# Patient Record
Sex: Female | Born: 1960
Health system: Southern US, Community
[De-identification: ages and names within clinical notes are randomized; demographics above are authoritative.]

## PROBLEM LIST (undated history)

## (undated) DIAGNOSIS — K589 Irritable bowel syndrome without diarrhea: Secondary | ICD-10-CM

## (undated) DIAGNOSIS — T8859XA Other complications of anesthesia, initial encounter: Secondary | ICD-10-CM

## (undated) DIAGNOSIS — L309 Dermatitis, unspecified: Secondary | ICD-10-CM

## (undated) DIAGNOSIS — IMO0002 Reserved for concepts with insufficient information to code with codable children: Secondary | ICD-10-CM

## (undated) DIAGNOSIS — E781 Pure hyperglyceridemia: Secondary | ICD-10-CM

## (undated) DIAGNOSIS — F329 Major depressive disorder, single episode, unspecified: Secondary | ICD-10-CM

## (undated) DIAGNOSIS — D649 Anemia, unspecified: Secondary | ICD-10-CM

## (undated) DIAGNOSIS — K2270 Barrett's esophagus without dysplasia: Secondary | ICD-10-CM

## (undated) DIAGNOSIS — E785 Hyperlipidemia, unspecified: Secondary | ICD-10-CM

## (undated) DIAGNOSIS — G35 Multiple sclerosis: Secondary | ICD-10-CM

## (undated) DIAGNOSIS — D509 Iron deficiency anemia, unspecified: Secondary | ICD-10-CM

## (undated) DIAGNOSIS — H269 Unspecified cataract: Secondary | ICD-10-CM

## (undated) DIAGNOSIS — K219 Gastro-esophageal reflux disease without esophagitis: Secondary | ICD-10-CM

## (undated) DIAGNOSIS — E669 Obesity, unspecified: Secondary | ICD-10-CM

## (undated) DIAGNOSIS — I10 Essential (primary) hypertension: Secondary | ICD-10-CM

## (undated) DIAGNOSIS — F32A Depression, unspecified: Secondary | ICD-10-CM

## (undated) DIAGNOSIS — F419 Anxiety disorder, unspecified: Secondary | ICD-10-CM

## (undated) DIAGNOSIS — M1990 Unspecified osteoarthritis, unspecified site: Secondary | ICD-10-CM

## (undated) DIAGNOSIS — W540XXA Bitten by dog, initial encounter: Secondary | ICD-10-CM

## (undated) DIAGNOSIS — T783XXA Angioneurotic edema, initial encounter: Secondary | ICD-10-CM

## (undated) HISTORY — PX: WISDOM TOOTH EXTRACTION: SHX21

## (undated) HISTORY — DX: Bitten by dog, initial encounter: W54.0XXA

## (undated) HISTORY — DX: Obesity, unspecified: E66.9

## (undated) HISTORY — DX: Dermatitis, unspecified: L30.9

## (undated) HISTORY — DX: Angioneurotic edema, initial encounter: T78.3XXA

## (undated) HISTORY — DX: Unspecified osteoarthritis, unspecified site: M19.90

## (undated) HISTORY — DX: Irritable bowel syndrome, unspecified: K58.9

## (undated) HISTORY — DX: Essential (primary) hypertension: I10

## (undated) HISTORY — DX: Gastro-esophageal reflux disease without esophagitis: K21.9

## (undated) HISTORY — DX: Depression, unspecified: F32.A

## (undated) HISTORY — DX: Barrett's esophagus without dysplasia: K22.70

## (undated) HISTORY — DX: Multiple sclerosis: G35

## (undated) HISTORY — DX: Major depressive disorder, single episode, unspecified: F32.9

## (undated) HISTORY — DX: Hyperlipidemia, unspecified: E78.5

## (undated) HISTORY — PX: OTHER SURGICAL HISTORY: SHX169

## (undated) HISTORY — DX: Pure hyperglyceridemia: E78.1

---

## 1988-05-11 DIAGNOSIS — G35 Multiple sclerosis: Secondary | ICD-10-CM

## 1988-05-11 HISTORY — DX: Multiple sclerosis: G35

## 2005-02-23 ENCOUNTER — Ambulatory Visit: Payer: Self-pay | Admitting: Cardiology

## 2006-02-22 ENCOUNTER — Ambulatory Visit: Payer: Self-pay | Admitting: Cardiology

## 2008-05-11 HISTORY — PX: CHOLECYSTECTOMY: SHX55

## 2009-01-08 ENCOUNTER — Encounter: Payer: Self-pay | Admitting: Family Medicine

## 2009-03-14 ENCOUNTER — Encounter: Payer: Self-pay | Admitting: Family Medicine

## 2009-05-23 ENCOUNTER — Ambulatory Visit: Payer: Self-pay | Admitting: Family Medicine

## 2009-05-23 DIAGNOSIS — E785 Hyperlipidemia, unspecified: Secondary | ICD-10-CM | POA: Insufficient documentation

## 2009-05-23 DIAGNOSIS — G35 Multiple sclerosis: Secondary | ICD-10-CM | POA: Insufficient documentation

## 2009-05-23 DIAGNOSIS — L259 Unspecified contact dermatitis, unspecified cause: Secondary | ICD-10-CM | POA: Insufficient documentation

## 2009-05-23 DIAGNOSIS — J438 Other emphysema: Secondary | ICD-10-CM | POA: Insufficient documentation

## 2009-05-23 DIAGNOSIS — Z8739 Personal history of other diseases of the musculoskeletal system and connective tissue: Secondary | ICD-10-CM | POA: Insufficient documentation

## 2009-05-23 DIAGNOSIS — IMO0002 Reserved for concepts with insufficient information to code with codable children: Secondary | ICD-10-CM | POA: Insufficient documentation

## 2009-05-23 DIAGNOSIS — I10 Essential (primary) hypertension: Secondary | ICD-10-CM | POA: Insufficient documentation

## 2009-05-23 DIAGNOSIS — K589 Irritable bowel syndrome without diarrhea: Secondary | ICD-10-CM | POA: Insufficient documentation

## 2009-05-23 DIAGNOSIS — F329 Major depressive disorder, single episode, unspecified: Secondary | ICD-10-CM | POA: Insufficient documentation

## 2009-05-23 DIAGNOSIS — K219 Gastro-esophageal reflux disease without esophagitis: Secondary | ICD-10-CM | POA: Insufficient documentation

## 2009-05-23 DIAGNOSIS — F341 Dysthymic disorder: Secondary | ICD-10-CM | POA: Insufficient documentation

## 2009-05-24 LAB — CONVERTED CEMR LAB: Hgb A1c MFr Bld: 5.4 % (ref 4.6–6.1)

## 2009-05-27 ENCOUNTER — Encounter: Payer: Self-pay | Admitting: Family Medicine

## 2009-05-28 ENCOUNTER — Encounter: Payer: Self-pay | Admitting: Family Medicine

## 2009-06-04 ENCOUNTER — Telehealth: Payer: Self-pay | Admitting: Family Medicine

## 2009-06-10 ENCOUNTER — Encounter: Payer: Self-pay | Admitting: Family Medicine

## 2009-06-11 ENCOUNTER — Telehealth: Payer: Self-pay | Admitting: Family Medicine

## 2009-06-11 ENCOUNTER — Ambulatory Visit (HOSPITAL_COMMUNITY): Payer: Self-pay | Admitting: Psychology

## 2009-06-11 LAB — CONVERTED CEMR LAB
Basophils Relative: 1 % (ref 0–1)
Calcium: 9.7 mg/dL (ref 8.4–10.5)
Chloride: 105 meq/L (ref 96–112)
Cholesterol: 186 mg/dL (ref 0–200)
Eosinophils Absolute: 0.3 10*3/uL (ref 0.0–0.7)
Eosinophils Relative: 7 % — ABNORMAL HIGH (ref 0–5)
Glucose, Bld: 90 mg/dL (ref 70–99)
Lymphocytes Relative: 40 % (ref 12–46)
Lymphs Abs: 1.8 10*3/uL (ref 0.7–4.0)
MCHC: 33.4 g/dL (ref 30.0–36.0)
Monocytes Relative: 7 % (ref 3–12)
Neutrophils Relative %: 45 % (ref 43–77)
Sodium: 141 meq/L (ref 135–145)
Total CHOL/HDL Ratio: 3.3
VLDL: 42 mg/dL — ABNORMAL HIGH (ref 0–40)
Vit D, 25-Hydroxy: 38 ng/mL (ref 30–89)
WBC: 4.5 10*3/uL (ref 4.0–10.5)

## 2009-06-24 ENCOUNTER — Ambulatory Visit: Payer: Self-pay | Admitting: Family Medicine

## 2009-06-24 ENCOUNTER — Telehealth: Payer: Self-pay | Admitting: Physician Assistant

## 2009-06-24 DIAGNOSIS — R51 Headache: Secondary | ICD-10-CM | POA: Insufficient documentation

## 2009-06-24 DIAGNOSIS — E669 Obesity, unspecified: Secondary | ICD-10-CM | POA: Insufficient documentation

## 2009-06-24 DIAGNOSIS — J019 Acute sinusitis, unspecified: Secondary | ICD-10-CM | POA: Insufficient documentation

## 2009-06-24 DIAGNOSIS — R519 Headache, unspecified: Secondary | ICD-10-CM | POA: Insufficient documentation

## 2009-07-17 ENCOUNTER — Encounter: Payer: Self-pay | Admitting: Family Medicine

## 2009-07-18 ENCOUNTER — Telehealth: Payer: Self-pay | Admitting: Physician Assistant

## 2009-07-19 ENCOUNTER — Ambulatory Visit (HOSPITAL_COMMUNITY): Payer: Self-pay | Admitting: Psychology

## 2009-08-13 ENCOUNTER — Inpatient Hospital Stay (HOSPITAL_COMMUNITY): Admission: AD | Admit: 2009-08-13 | Discharge: 2009-08-21 | Payer: Self-pay

## 2009-08-29 ENCOUNTER — Encounter: Payer: Self-pay | Admitting: Family Medicine

## 2009-09-05 ENCOUNTER — Encounter: Payer: Self-pay | Admitting: Family Medicine

## 2009-09-10 ENCOUNTER — Encounter: Payer: Self-pay | Admitting: Family Medicine

## 2009-09-23 ENCOUNTER — Ambulatory Visit: Payer: Self-pay | Admitting: Family Medicine

## 2009-09-23 ENCOUNTER — Telehealth: Payer: Self-pay | Admitting: Family Medicine

## 2009-09-24 ENCOUNTER — Ambulatory Visit: Payer: Self-pay | Admitting: Family Medicine

## 2009-09-25 ENCOUNTER — Encounter: Payer: Self-pay | Admitting: Family Medicine

## 2009-10-09 ENCOUNTER — Encounter: Payer: Self-pay | Admitting: Family Medicine

## 2009-10-31 ENCOUNTER — Encounter: Payer: Self-pay | Admitting: Family Medicine

## 2009-11-15 ENCOUNTER — Encounter: Payer: Self-pay | Admitting: Family Medicine

## 2009-11-26 ENCOUNTER — Ambulatory Visit: Payer: Self-pay | Admitting: Family Medicine

## 2009-12-06 DIAGNOSIS — W540XXA Bitten by dog, initial encounter: Secondary | ICD-10-CM

## 2009-12-06 HISTORY — DX: Bitten by dog, initial encounter: W54.0XXA

## 2009-12-16 ENCOUNTER — Ambulatory Visit (HOSPITAL_COMMUNITY): Payer: Self-pay | Admitting: Psychology

## 2010-02-27 ENCOUNTER — Ambulatory Visit: Payer: Self-pay | Admitting: Family Medicine

## 2010-02-27 DIAGNOSIS — R0989 Other specified symptoms and signs involving the circulatory and respiratory systems: Secondary | ICD-10-CM | POA: Insufficient documentation

## 2010-06-10 ENCOUNTER — Ambulatory Visit
Admission: RE | Admit: 2010-06-10 | Discharge: 2010-06-10 | Payer: Self-pay | Source: Home / Self Care | Attending: Internal Medicine | Admitting: Internal Medicine

## 2010-06-10 NOTE — Assessment & Plan Note (Signed)
Summary: SINUS   Vital Signs:  Patient profile:   50 year old female Height:      69.5 inches Weight:      211.25 pounds BMI:     30.86 O2 Sat:      98 % on Room air Temp:     97.7 degrees F oral Pulse rate:   107 / minute Resp:     16 per minute BP sitting:   136 / 91  (left arm)  Vitals Entered By: Lilyan Gilford LPN (June 24, 2009 1:49 PM)  Nutrition Counseling: Patient's BMI is greater than 25 and therefore counseled on weight management options.  O2 Flow:  Room air  CC: headache, sinus pressure, clear drainage x 3 days, Headache Comments second bp check 100/70   Primary Care Provider:  Syliva Overman MD  CC:  headache, sinus pressure, clear drainage x 3 days, and Headache.  History of Present Illness: Pt c/o 3 weeks of headache.  Also having sinus congestion & pressure.  Not much drainage, or mucuswhen blows nose.  Feels full & packed in sinuses.  An OTC sinus tab did relieve syptoms yesterday but uncertain if she should take it due to BP problems.   Also, was recently referred to Dietician at [facility], but dietician has left.  Was wondering about referral to different dietician.  Wants to lose weight, and also has questions regarding protein intake.  Is eating more of a vegetarian diet recently.  Alot of stress currently.  Financial.   Allergies (verified): 1)  ! Pcn  Past History:  Past medical, surgical, family and social histories (including risk factors) reviewed for relevance to current acute and chronic problems.  Past Medical History: Reviewed history from 05/23/2009 and no changes required. Current Problems:  EMPHYSEMA (ICD-492.8) ECZEMA (ICD-692.9) DEPRESSION (ICD-311) HYPERLIPIDEMIA (ICD-272.4) GERD (ICD-530.81)  Dr Renae Fickle IRRITABLE BOWEL SYNDROME (ICD-564.1), constipation predominant DEGENERATIVE DISC DISEASE (ICD-722.6) HYPERTENSION (ICD-401.9) MULTIPLE SCLEROSIS (ICD-340)  diagnosed  1990 Triglycerides elevated  Past Surgical  History: Reviewed history from 05/23/2009 and no changes required. Cholecystectomy (2010)  Dr Gabriel Cirri Teeth extractions  Family History: Reviewed history from 05/23/2009 and no changes required. Mother deceased- COPD, alcoholism, depression Father deceased- Massive heart attack, alcoholism  82 One brother deceased- brain cancer 68 One sister living- HTN  Social History: Reviewed history from 05/23/2009 and no changes required. Disabled since 2002 Married Two grown children, stillborn infant Alcohol use-no Drug use-no Regular exercise-no Former smoker  Review of Systems       The patient complains of headaches.  The patient denies fever, chest pain, dyspnea on exertion, and enlarged lymph nodes.   General:  Denies chills, fatigue, and fever. ENT:  Complains of earache, nasal congestion, and sinus pressure; denies ear discharge, postnasal drainage, and sore throat. CV:  Denies chest pain or discomfort and lightheadness. Resp:  Complains of cough; denies chest discomfort, shortness of breath, and sputum productive; occasionally coughs, nonproductive. GI:  Complains of abdominal pain and indigestion; seeing GI for ulcers.  also seems to be aggrevated by eating meat so has been eating more of a vegetarian diet.. MS:  Has MS, no change of usual symptoms.. Heme:  Denies enlarge lymph nodes. Allergy:  Complains of seasonal allergies and sneezing.  Physical Exam  General:  Well-developed,well-nourished,in no acute distress; alert,appropriate and cooperative throughout examination Head:  Normocephalic and atraumatic without obvious abnormalities. No apparent alopecia or balding. Eyes:  pupils equal, pupils round, pupils reactive to light, and no injection.   Ears:  External ear exam shows no significant lesions or deformities.  Otoscopic examination reveals clear canals, tympanic membranes are intact bilaterally without bulging, retraction, inflammation or discharge. Hearing is grossly  normal bilaterally. Nose:  no external deformity, no nasal discharge, no mucosal edema, mucosal erythema, L frontal sinus tenderness, L maxillary sinus tenderness, R frontal sinus tenderness, and R maxillary sinus tenderness.   Mouth:  Oral mucosa and oropharynx without lesions or exudates.  Teeth in good repair. Neck:  No deformities, masses, or tenderness noted.no cervical lymphadenopathy.   Lungs:  Normal respiratory effort, chest expands symmetrically. Lungs are clear to auscultation, no crackles or wheezes. Heart:  Normal rate and regular rhythm. S1 and S2 normal without gallop, murmur, click, rub or other extra sounds. Cervical Nodes:  No lymphadenopathy noted Psych:  Cognition and judgment appear intact. Alert and cooperative with normal attention span and concentration. No apparent delusions, illusions, hallucinations good eye contact.     Impression & Recommendations:  Problem # 1:  ACUTE SINUSITIS, UNSPECIFIED (ICD-461.9)  Her updated medication list for this problem includes:    Keflex 500 Mg Caps (Cephalexin) .Marland Kitchen... 1 cap three times a day x 10 days  Problem # 2:  HEADACHE (ICD-784.0)  Her updated medication list for this problem includes:    Ultram 50 Mg Tabs (Tramadol hcl) .Marland Kitchen... 1 q 6 hrs as needed ha or pain Discussed with pt that this may be from her sinus infection but also could be due to the increased stress she's been under the last few weeks.   Problem # 3:  OBESITY, UNSPECIFIED (ICD-278.00) Will refer pt to dietician in [place] to address dietary concerns & help her with weight loss.  Problem # 4:  HYPERTENSION (ICD-401.9) Assessment: Improved  Her updated medication list for this problem includes:    Lotensin 10 Mg Tabs (Benazepril hcl) .Marland Kitchen... Take 1 tablet by mouth once a day  Complete Medication List: 1)  Lotensin 10 Mg Tabs (Benazepril hcl) .... Take 1 tablet by mouth once a day 2)  Keflex 500 Mg Caps (Cephalexin) .Marland Kitchen.. 1 cap three times a day x 10 days 3)   Ultram 50 Mg Tabs (Tramadol hcl) .Marland Kitchen.. 1 q 6 hrs as needed ha or pain  Patient Instructions: 1)  Take over the counter cough and cold medications only as directed on the package insert. DO NOT take more than recommended .  Avoid cold medications with decongestant due to high blood pressure. 2)  Please schedule a follow-up appointment as needed. 3)  We will do a referral to a dietician in [place] for you. Prescriptions: ULTRAM 50 MG TABS (TRAMADOL HCL) 1 q 6 hrs as needed HA or pain  #40 x 0   Entered and Authorized by:   Esperanza Sheets PA   Signed by:   Lilyan Gilford LPN on 16/02/9603   Method used:   Electronically to        [facility]* (retail)       [address]       [place], [place]  [address]       Ph: [phone]       Fax: [phone]   RxID:   [id number] KEFLEX 500 MG CAPS (CEPHALEXIN) 1 cap three times a day x 10 days  #30 x 0   Entered and Authorized by:   Esperanza Sheets PA   Signed by:   Lilyan Gilford LPN on 03/07/2535   Method used:  Electronically to        [facility]* (retail)       [address]       [place], [place]  [address]       Ph: [phone]       Fax: [phone]   RxID:   [id number]   Appended Document: SINUS pCN allergy on record, pt to be called to verify keflex is ok/change to another abiotic

## 2010-06-10 NOTE — Progress Notes (Signed)
Summary: [facility]   Imported By: Lind Guest 07/19/2009 14:49:17  _____________________________________________________________________  External Attachment:    Type:   Image     Comment:   External Document

## 2010-06-10 NOTE — Letter (Signed)
Summary: MEDICAL RELEASE  MEDICAL RELEASE   Imported By: Lind Guest 05/28/2009 09:33:23  _____________________________________________________________________  External Attachment:    Type:   Image     Comment:   External Document

## 2010-06-10 NOTE — Assessment & Plan Note (Signed)
Summary: NEW PATIENT   Vital Signs:  Patient profile:   50 year old female Height:      69.5 inches Weight:      214.25 pounds BMI:     31.30 O2 Sat:      98 % on Room air Pulse rate:   120 / minute Pulse rhythm:   regular Resp:     16 per minute BP sitting:   140 / 80  (left arm)  Vitals Entered By: Worthy Keeler LPN (May 23, 2009 1:56 PM)  Nutrition Counseling: Patient's BMI is greater than 25 and therefore counseled on weight management options.  O2 Flow:  Room air CC: new patient Is Patient Diabetic? No Pain Assessment Patient in pain? yes     Location: back and legs Intensity: 7 Type: aching Onset of pain  Chronic   Primary Care Provider:  Syliva Overman MD  CC:  new patient.  History of Present Illness: this is a new pt evaluation for a pt who has becomr disbled from multiple sclerosis, and who has become increasingly depressed and socially withdrawn, not even as involved with her grandchildren as she would like to be.. she is concerned about her weight , and reports unsuccesful attempts a weight loss, which is compounded by poor mobility and repeated falls from her neurologic disease. She recently had labwork showing an inc in her triglycerides which concerns her alot, there is a family h/o cAD.  Preventive Screening-Counseling & Management  Caffeine-Diet-Exercise     Does Patient Exercise: no      Drug Use:  no.    Allergies (verified): 1)  ! Pcn  Past History:  Past Medical History: Current Problems:  EMPHYSEMA (ICD-492.8) ECZEMA (ICD-692.9) DEPRESSION (ICD-311) HYPERLIPIDEMIA (ICD-272.4) GERD (ICD-530.81)  Dr Renae Fickle IRRITABLE BOWEL SYNDROME (ICD-564.1), constipation predominant DEGENERATIVE DISC DISEASE (ICD-722.6) HYPERTENSION (ICD-401.9) MULTIPLE SCLEROSIS (ICD-340)  diagnosed  1990 Triglycerides elevated  Past Surgical History: Cholecystectomy (2010)  Dr Gabriel Cirri Teeth extractions  Family History: Mother deceased- COPD,  alcoholism, depression Father deceased- Massive heart attack, alcoholism  32 One brother deceased- brain cancer 79 One sister living- HTN  Social History: Disabled since 2002 Married Two grown children, stillborn infant Alcohol use-no Drug use-no Regular exercise-no Former smokerDrug Use:  no Does Patient Exercise:  no  Review of Systems      See HPI General:  Complains of fatigue, malaise, sleep disorder, and weakness; denies chills and fever. Eyes:  Denies blurring, double vision, and halos. ENT:  Denies hoarseness, nasal congestion, sinus pressure, and sore throat. CV:  Denies chest pain or discomfort, palpitations, and swelling of feet. Resp:  Denies cough and sputum productive. GI:  Denies abdominal pain, constipation, diarrhea, nausea, and vomiting blood. GU:  Denies dysuria and urinary frequency. Derm:  Denies itching and rash. Neuro:  Complains of falling down and poor balance; denies seizures and sensation of room spinning. Psych:  Complains of depression and mental problems; denies suicidal thoughts/plans, thoughts of violence, and unusual visions or sounds; states she spends most of her time alone, even in her own home with her spouse, between bed and the kitches. She is unable to ciook much and concentrates on cleaning as able. Endo:  Denies excessive thirst and excessive urination. Heme:  Denies abnormal bruising and bleeding. Allergy:  Denies hives or rash and sneezing.   Impression & Recommendations:  Problem # 1:  HYPERLIPIDEMIA (ICD-272.4) Assessment Comment Only  Orders: T-Lipid Profile ([address]), low fat diet discusesed and encouraged, and information also provided  Problem # 2:  DEPRESSION (ICD-311) Assessment: Deteriorated  Orders: Psychology Referral (Psychology)  Problem # 3:  HYPERTENSION (ICD-401.9) Assessment: Comment Only  Her updated medication list for this problem includes:    Lotensin 10 Mg Tabs (Benazepril hcl) .Marland Kitchen... Take 1 tablet  by mouth once a day  BP today: 140/80  Problem # 4:  MULTIPLE SCLEROSIS (ICD-340) Assessment: Comment Only diagnosed in 1990, followed by the same neurologist for over 7 yrs  Complete Medication List: 1)  Lotensin 10 Mg Tabs (Benazepril hcl) .... Take 1 tablet by mouth once a day  Other Orders: T- Hemoglobin A1C ([address]) T-TSH [phone]) T-Basic Metabolic Panel [phone]) T-CBC w/Diff ([id number]) T-Vitamin D (25-Hydroxy) ([address]-24401)  Patient Instructions: 1)  Please schedule a follow-up appointment in 2 months. 2)  BMP prior to visit, ICD-9: 3)  Lipid Panel prior to visit, ICD-9:  fasting 4)  TSH prior to visit, ICD-9: 5)  CBC w/ Diff prior to visit, ICD-9: 6)  Vit D level 7)  Please reduce your intake so you can lose some weight 8)  I believe you will benefit from mental heralth referral. Prescriptions: LOTENSIN 10 MG TABS (BENAZEPRIL HCL) Take 1 tablet by mouth once a day  #30 x 3   Entered and Authorized by:   Syliva Overman MD   Signed by:   Syliva Overman MD on 05/23/2009   Method used:   Electronically to        [facility]* (retail)       [address]       [place], [place]  [address]       Ph: [phone]       Fax: [phone]   RxID:   [id number]

## 2010-06-10 NOTE — Progress Notes (Signed)
Summary: referral  Phone Note Call from Patient   Summary of Call: pt has appt with dr. Kieth Brightly for 06/11/2009 10:00. left message for pt to call office about appt.  Initial call taken by: Rudene Anda,  June 04, 2009 3:13 PM

## 2010-06-10 NOTE — Miscellaneous (Signed)
Summary: Home Care Report  Home Care Report   Imported By: Lind Guest 08/29/2009 16:17:04  _____________________________________________________________________  External Attachment:    Type:   Image     Comment:   External Document

## 2010-06-10 NOTE — Progress Notes (Signed)
  Phone Note From Pharmacy   Caller: [facility]*  Follow-up for Phone Call        patient states keflex bothers her stomach, is there somthing else you can send in to replace that? Follow-up by: Lilyan Gilford LPN,  June 24, 2009 3:56 PM  Additional Follow-up for Phone Call Additional follow up Details #1::        I E-Rx'd Zpak for her. Additional Follow-up by: Esperanza Sheets PA,  June 24, 2009 4:02 PM    New/Updated Medications: ZITHROMAX Z-PAK 250 MG TABS (AZITHROMYCIN) standard directions Prescriptions: ZITHROMAX Z-PAK 250 MG TABS (AZITHROMYCIN) standard directions  #1 pack x 0   Entered and Authorized by:   Esperanza Sheets PA   Signed by:   Esperanza Sheets PA on 06/24/2009   Method used:   Electronically to        [facility]* (retail)       [address]       [place], [place]  [address]       Ph: [phone]       Fax: [phone]   RxID:   [id number]

## 2010-06-10 NOTE — Op Note (Signed)
Summary: ESOPHAGOGASTRODUODENOSCOPY / COLONSCOPY  ESOPHAGOGASTRODUODENOSCOPY / COLONSCOPY   Imported By: Lind Guest 05/27/2009 14:46:30  _____________________________________________________________________  External Attachment:    Type:   Image     Comment:   External Document

## 2010-06-10 NOTE — Progress Notes (Signed)
  Phone Note From Pharmacy   Caller: [facility]* Summary of Call: patient requesting miralax refill not on current med list Initial call taken by: Adella Hare LPN,  July 18, 2009 2:57 PM  Follow-up for Phone Call        Rx sent to pharmacy. Follow-up by: Esperanza Sheets PA,  July 18, 2009 3:10 PM    New/Updated Medications: POLYETHYLENE GLYCOL 3350  POWD (POLYETHYLENE GLYCOL 3350) 17 grams once daily in 4-8 oz of fluid Prescriptions: POLYETHYLENE GLYCOL 3350  POWD (POLYETHYLENE GLYCOL 3350) 17 grams once daily in 4-8 oz of fluid  #1 bottle x 5   Entered and Authorized by:   Esperanza Sheets PA   Signed by:   Esperanza Sheets PA on 07/18/2009   Method used:   Electronically to        [facility]* (retail)       [address]       [place], [place]  [address]       Ph: [phone]       Fax: [phone]   RxID:   [id number]

## 2010-06-10 NOTE — Progress Notes (Signed)
Summary: DR. Gerilyn Pilgrim  DR. DOONQUAH   Imported By: Lind Guest 10/16/2009 09:38:38  _____________________________________________________________________  External Attachment:    Type:   Image     Comment:   External Document

## 2010-06-10 NOTE — Assessment & Plan Note (Signed)
Summary: CERTIFICATION   Allergies: 1)  ! Pcn   Complete Medication List: 1)  Lotensin 10 Mg Tabs (Benazepril hcl) .... Take 1 tablet by mouth once a day 2)  Ultram 50 Mg Tabs (Tramadol hcl) .Marland Kitchen.. 1 q 6 hrs as needed ha or pain 3)  Polyethylene Glycol 3350 Powd (Polyethylene glycol 3350) .Marland KitchenMarland KitchenMarland Kitchen 17 grams once daily in 4-8 oz of fluid CERTIFICATION FORM reviewed and signed, billing to be completed  Appended Document: CERTIFICATION pls verify the billing for certification and insert a chg this is a certification , not recert, thanks  Appended Document: CERTIFICATION dx for certification are multiple sclerosis, weakness dysphagia  Appended Document: CERTIFICATION This was checked in bar inquires and already has been billed.

## 2010-06-10 NOTE — Assessment & Plan Note (Signed)
Summary: office visit   Vital Signs:  Patient profile:   50 year old female Height:      69.5 inches Weight:      235.50 pounds BMI:     34.40 O2 Sat:      97 % Pulse rate:   80 / minute Pulse rhythm:   regular Resp:     16 per minute BP sitting:   114 / 82  (left arm) Cuff size:   large  Vitals Entered By: Everitt Amber LPN (November 26, 2009 1:19 PM)  Nutrition Counseling: Patient's BMI is greater than 25 and therefore counseled on weight management options. CC: Follow up chronic problems   Primary Care Provider:  Syliva Overman MD  CC:  Follow up chronic problems.  History of Present Illness: Pt i  for f/u care after a long hiatus, during which she required hospiralisation for a flare of her mS, which caused her to have suignificant loss of strength. she ahs had therapy since then with improvement. She recently underwent rept upper endo for Barret's disease, athte time of her visit she had been bitten by her dog, andhas been raking cleoci with no c/o diarrreah, and no noted eryhtema or increased tendeness in the area which is infact healed well. Shereports some frustration with weight loss efforts, state she saw the nutrtionois t who advised inc activity, however he rMS limits this. She denies aney recent feve or chills. She denies head or chest congestion. She denies dysuria or frequency.   Current Medications (verified): 1)  Acetaminophen 325 Mg Tabs (Acetaminophen) .... Two Tabs By Mouth Every Four Hours As Needed 2)  Baclofen 10 Mg Tabs (Baclofen) .... Two Tabs By Mouth Every 6 Hours 3)  Docusate Sodium 100 Mg Caps (Docusate Sodium) .... One Cap By Mouth Two Times A Day 4)  Gabapentin 800 Mg Tabs (Gabapentin) .... One Tab By Mouth Every 6 Hours 5)  Ibuprofen 400 Mg Tabs (Ibuprofen) .... One Tab By Mouth Every Four Hours As Needed 6)  Methocarbamol 500 Mg Tabs (Methocarbamol) .... One and Half Tabs By Mouth Three Times A Day 7)  Diazepam 10 Mg Tabs (Diazepam) .... One Tab By  Mouth Four Times Daily 8)  Amphetamine-Dextroamphetamine 10 Mg Tabs (Amphetamine-Dextroamphetamine) .... One Tab By Mouth Two Times A Day 9)  Aspirin Ec 81 Mg Tbec (Aspirin) .... One Tab By Mouth Once Daily 10)  Lotensin 10 Mg Tabs (Benazepril Hcl) .... One Tab By Mouth Once Daily 11)  Betaseron 0.3 Mg Solr (Interferon Beta-1b) .... One Injection Subcutaneously Every Other Day 12)  Endocet 10-325 Mg Tabs (Oxycodone-Acetaminophen) .... One Tab By Mouth Every 6 To 8 Hours As Needed For Pain 13)  Fish Oil 1000 Mg Caps (Omega-3 Fatty Acids) .... One Cap By Mouth Once Daily 14)  Multivitamins  Tabs (Multiple Vitamin) .... One Tab By Mouth Once Daily 15)  Lansoprazole 30 Mg Cpdr (Lansoprazole) .... Take 1 Tablet By Mouth Two Times A Day 16)  Amlodipine Besylate 5 Mg Tabs (Amlodipine Besylate) .... Take 1 Daily 17)  Lexapro 10 Mg Tabs (Escitalopram Oxalate) .... Take 1 Tablet By Mouth Once A Day  Allergies (verified): 1)  ! Pcn  Past History:  Past Medical History: Current Problems:  EMPHYSEMA (ICD-492.8) ECZEMA (ICD-692.9) DEPRESSION (ICD-311) HYPERLIPIDEMIA (ICD-272.4) GERD (ICD-530.81)  Dr Renae Fickle, with dysplasia which is low grade and Baretts since 04/2009 IRRITABLE BOWEL SYNDROME (ICD-564.1), constipation predominant DEGENERATIVE DISC DISEASE (ICD-722.6) HYPERTENSION (ICD-401.9) MULTIPLE SCLEROSIS (ICD-340)  diagnosed  1990, most recent  hospitalisation for a flare is in 2011 Triglycerides elevated obesity dog bite in july 29011  Review of Systems      See HPI Eyes:  Denies blurring and discharge. ENT:  Denies hoarseness, nasal congestion, postnasal drainage, and sinus pressure. CV:  Denies chest pain or discomfort, palpitations, and swelling of feet. Resp:  Denies cough and sputum productive. GI:  Complains of abdominal pain and indigestion; denies constipation, diarrhea, nausea, and vomiting. GU:  Denies dysuria and urinary frequency. MS:  Complains of muscle weakness; left foot  has been turning in in the past 5 days,increased stress, recently was bitten by her dog on the right hand and also has low grade dysplasia in her esophagus. Psych:  Complains of anxiety, depression, and mental problems; denies suicidal thoughts/plans, thoughts of violence, and unusual visions or sounds. Endo:  Denies cold intolerance, excessive hunger, excessive thirst, and excessive urination. Heme:  Denies abnormal bruising and bleeding. Allergy:  Denies hives or rash and itching eyes.  Physical Exam  General:  Well-developed,obese,in no acute distress; alert,appropriate and cooperative throughout examination HEENT: No facial asymmetry,  EOMI, No sinus tenderness, TM's Clear, oropharynx  pink and moist.   Chest: Clear to auscultation bilaterally.  CVS: S1, S2, No murmurs, No S3.   Abd: Soft,mild epigastric tenderness, no guarding or rbound MS: deceased ROM spine, hips, shoulders and knees. Uses a walker, left foot everted Ext: No edema.   CNS: CN 2-12 intact,decreased power and  tone l throughout. Sensation nl  Skin: Intact, no visible lesions or rashes.  Psych: Good eye contact, normal affect.  Memory intact,both nxious and  depressed appearing.    Impression & Recommendations:  Problem # 1:  OBESITY, UNSPECIFIED (ICD-278.00) Assessment Deteriorated  Ht: 69.5 (11/26/2009)   Wt: 235.50 (11/26/2009)   BMI: 34.40 (11/26/2009)  Problem # 2:  DEPRESSION (ICD-311) Assessment: Deteriorated  Her updated medication list for this problem includes:    Diazepam 10 Mg Tabs (Diazepam) ..... One tab by mouth four times daily    Lexapro 10 Mg Tabs (Escitalopram oxalate) .Marland Kitchen... Take 1 tablet by mouth once a day  Discussed treatment options, including trial of antidpressant medication. Will refer to behavioral health. Follow-up call in in 24-48 hours and recheck in 2 weeks, sooner as needed. Patient agrees to call if any worsening of symptoms or thoughts of doing harm arise. Verified that the  patient has no suicidal ideation at this time.   I encouaged the pt to return to therapy , she agreed  Problem # 3:  HYPERLIPIDEMIA (ICD-272.4) Assessment: Comment Only    HDL:57 (06/10/2009)  LDL:87 (06/10/2009)  Chol:186 (06/10/2009)  Trig:212 (06/10/2009), loow fat diet discussed and encouraged  Problem # 4:  HYPERTENSION (ICD-401.9) Assessment: Unchanged  Her updated medication list for this problem includes:    Lotensin 10 Mg Tabs (Benazepril hcl) ..... One tab by mouth once daily    Amlodipine Besylate 5 Mg Tabs (Amlodipine besylate) .Marland Kitchen... Take 1 daily  BP today: 114/82 Prior BP: 122/70 (09/24/2009)  Labs Reviewed: K+: 4.1 (06/10/2009) Creat: : 0.67 (06/10/2009)   Chol: 186 (06/10/2009)   HDL: 57 (06/10/2009)   LDL: 87 (06/10/2009)   TG: 212 (06/10/2009)  Problem # 5:  GERD (ICD-530.81) Assessment: Deteriorated  Her updated medication list for this problem includes:    Lansoprazole 30 Mg Cpdr (Lansoprazole) .Marland Kitchen... Take 1 tablet by mouth two times a day  Problem # 6:  MULTIPLE SCLEROSIS (ICD-340) Assessment: Deteriorated followed closely by neurology  Complete Medication List: 1)  Acetaminophen 325 Mg Tabs (Acetaminophen) .... Two tabs by mouth every four hours as needed 2)  Baclofen 10 Mg Tabs (Baclofen) .... Two tabs by mouth every 6 hours 3)  Docusate Sodium 100 Mg Caps (Docusate sodium) .... One cap by mouth two times a day 4)  Gabapentin 800 Mg Tabs (Gabapentin) .... One tab by mouth every 6 hours 5)  Ibuprofen 400 Mg Tabs (Ibuprofen) .... One tab by mouth every four hours as needed 6)  Methocarbamol 500 Mg Tabs (Methocarbamol) .... One and half tabs by mouth three times a day 7)  Diazepam 10 Mg Tabs (Diazepam) .... One tab by mouth four times daily 8)  Amphetamine-dextroamphetamine 10 Mg Tabs (Amphetamine-dextroamphetamine) .... One tab by mouth two times a day 9)  Aspirin Ec 81 Mg Tbec (Aspirin) .... One tab by mouth once daily 10)  Lotensin 10 Mg Tabs  (Benazepril hcl) .... One tab by mouth once daily 11)  Betaseron 0.3 Mg Solr (Interferon beta-1b) .... One injection subcutaneously every other day 12)  Endocet 10-325 Mg Tabs (Oxycodone-acetaminophen) .... One tab by mouth every 6 to 8 hours as needed for pain 13)  Fish Oil 1000 Mg Caps (Omega-3 fatty acids) .... One cap by mouth once daily 14)  Multivitamins Tabs (Multiple vitamin) .... One tab by mouth once daily 15)  Lansoprazole 30 Mg Cpdr (Lansoprazole) .... Take 1 tablet by mouth two times a day 16)  Amlodipine Besylate 5 Mg Tabs (Amlodipine besylate) .... Take 1 daily 17)  Lexapro 10 Mg Tabs (Escitalopram oxalate) .... Take 1 tablet by mouth once a day  Patient Instructions: 1)  Please schedule a follow-up appointment in 3 months. 2)  You need to lose weight. Consider a lower calorie diet and regular exercise as you are able. 3)  the dog bite is well healed, i do think you need to give away the dog for your safety. 4)  i will contact dr.Doonquah re the cost of the lexapro and suggest citalopram. 5)  pls call Dr Ollen Gross office in approx 3 weeks if you still have uncontrolled symptoms 6)  i

## 2010-06-10 NOTE — Progress Notes (Signed)
Summary: [facility]   Imported By: Lind Guest 10/31/2009 14:01:48  _____________________________________________________________________  External Attachment:    Type:   Image     Comment:   External Document

## 2010-06-10 NOTE — Assessment & Plan Note (Signed)
Summary: hospital follow up- room 2   Vital Signs:  Patient profile:   50 year old female Height:      69.5 inches Weight:      228.75 pounds BMI:     33.42 O2 Sat:      97 % on Room air Pulse rate:   96 / minute Resp:     16 per minute BP sitting:   122 / 70  (left arm)  Vitals Entered By: Adella Hare LPN (Sep 24, 2009 2:27 PM)  Nutrition Counseling: Patient's BMI is greater than 25 and therefore counseled on weight management options. CC: hospital follow up Is Patient Diabetic? No Pain Assessment Patient in pain? no        Primary Provider:  Syliva Overman MD  CC:  hospital follow up.  History of Present Illness: Pt is here today for blood pressure check.  She was recently hospitalized related to her MS BP was low in hospital so they d/c Amlodipine.  She restarted Amlodipine over the wkend.  She denies chest pain or palpitations.  Has had HA, but this is chronic and unchanged.  She is also having problems with GERD and aspiration.  She states she is seeing GI and has another appt next week.  Has recently had an EGD done.  She is elevating the head of her bed, not eating after 4 pm etc all in an effort to help.  she is also very frustrated with her wt.  She feels like she doesnt eat enough to keep gaining.  She was referred to dietician previously but was cancelled.  Pt is inactive due to her MS and knows this is part of the problem.   Current Medications (verified): 1)  Acetaminophen 325 Mg Tabs (Acetaminophen) .... Two Tabs By Mouth Every Four Hours As Needed 2)  Baclofen 10 Mg Tabs (Baclofen) .... Two Tabs By Mouth Every 6 Hours 3)  Docusate Sodium 100 Mg Caps (Docusate Sodium) .... One Cap By Mouth Two Times A Day 4)  Gabapentin 800 Mg Tabs (Gabapentin) .... One Tab By Mouth Every 6 Hours 5)  Ibuprofen 400 Mg Tabs (Ibuprofen) .... One Tab By Mouth Every Four Hours As Needed 6)  Methocarbamol 500 Mg Tabs (Methocarbamol) .... One and Half Tabs By Mouth Three Times A  Day 7)  Diazepam 10 Mg Tabs (Diazepam) .... One Tab By Mouth Four Times Daily 8)  Amphetamine-Dextroamphetamine 10 Mg Tabs (Amphetamine-Dextroamphetamine) .... One Tab By Mouth Two Times A Day 9)  Aspirin Ec 81 Mg Tbec (Aspirin) .... One Tab By Mouth Once Daily 10)  Lotensin 10 Mg Tabs (Benazepril Hcl) .... One Tab By Mouth Once Daily 11)  Betaseron 0.3 Mg Solr (Interferon Beta-1b) .... One Injection Subcutaneously Every Other Day 12)  Endocet 10-325 Mg Tabs (Oxycodone-Acetaminophen) .... One Tab By Mouth Every 6 To 8 Hours As Needed For Pain 13)  Fish Oil 1000 Mg Caps (Omega-3 Fatty Acids) .... One Cap By Mouth Once Daily 14)  Multivitamins  Tabs (Multiple Vitamin) .... One Tab By Mouth Once Daily 15)  Omeprazole 20 Mg Cpdr (Omeprazole) .... One Cap By Mouth Two Times A Day  Allergies (verified): 1)  ! Pcn  Past History:  Past medical history reviewed for relevance to current acute and chronic problems.  Past Medical History: Reviewed history from 05/23/2009 and no changes required. Current Problems:  EMPHYSEMA (ICD-492.8) ECZEMA (ICD-692.9) DEPRESSION (ICD-311) HYPERLIPIDEMIA (ICD-272.4) GERD (ICD-530.81)  Dr Renae Fickle IRRITABLE BOWEL SYNDROME (ICD-564.1), constipation predominant DEGENERATIVE DISC  DISEASE (ICD-722.6) HYPERTENSION (ICD-401.9) MULTIPLE SCLEROSIS (ICD-340)  diagnosed  1990 Triglycerides elevated  Review of Systems General:  Denies chills and fever. ENT:  Denies earache, nasal congestion, and sore throat. CV:  Denies chest pain or discomfort and palpitations. Resp:  Complains of cough; denies shortness of breath, sputum productive, and wheezing. GI:  Complains of indigestion; denies abdominal pain, change in bowel habits, nausea, and vomiting.  Physical Exam  General:  Well-developed,well-nourished,in no acute distress; alert,appropriate and cooperative throughout examination Head:  Normocephalic and atraumatic without obvious abnormalities. No apparent alopecia  or balding. Ears:  External ear exam shows no significant lesions or deformities.  Otoscopic examination reveals clear canals, tympanic membranes are intact bilaterally without bulging, retraction, inflammation or discharge. Hearing is grossly normal bilaterally. Nose:  External nasal examination shows no deformity or inflammation. Nasal mucosa are pink and moist without lesions or exudates. Mouth:  Oral mucosa and oropharynx without lesions or exudates.   Neck:  No deformities, masses, or tenderness noted. Lungs:  Normal respiratory effort, chest expands symmetrically. Lungs are clear to auscultation, no crackles or wheezes. Heart:  Normal rate and regular rhythm. S1 and S2 normal without gallop, murmur, click, rub or other extra sounds.   Impression & Recommendations:  Problem # 1:  HYPERTENSION (ICD-401.9) Assessment Improved BP is good today.  Advised pt to continue taking Amlodipine.  The following medications were removed from the medication list:    Lotensin 10 Mg Tabs (Benazepril hcl) .Marland Kitchen... Take 1 tablet by mouth once a day Her updated medication list for this problem includes:    Lotensin 10 Mg Tabs (Benazepril hcl) ..... One tab by mouth once daily    Amlodipine Besylate 5 Mg Tabs (Amlodipine besylate) .Marland Kitchen... Take 1 daily  Problem # 2:  GERD (ICD-530.81) Assessment: Deteriorated  Her updated medication list for this problem includes:    Omeprazole 20 Mg Cpdr (Omeprazole) ..... One cap by mouth two times a day  Orders: Misc. Referral (Misc. Ref)  Problem # 3:  MULTIPLE SCLEROSIS (ICD-340) Assessment: Deteriorated Continue f/u with neurologist.  Problem # 4:  OBESITY, UNSPECIFIED (ICD-278.00) Assessment: Deteriorated  Orders: Misc. Referral (Misc. Ref)  Ht: 69.5 (09/24/2009)   Wt: 228.75 (09/24/2009)   BMI: 33.42 (09/24/2009)  Complete Medication List: 1)  Acetaminophen 325 Mg Tabs (Acetaminophen) .... Two tabs by mouth every four hours as needed 2)  Baclofen 10 Mg  Tabs (Baclofen) .... Two tabs by mouth every 6 hours 3)  Docusate Sodium 100 Mg Caps (Docusate sodium) .... One cap by mouth two times a day 4)  Gabapentin 800 Mg Tabs (Gabapentin) .... One tab by mouth every 6 hours 5)  Ibuprofen 400 Mg Tabs (Ibuprofen) .... One tab by mouth every four hours as needed 6)  Methocarbamol 500 Mg Tabs (Methocarbamol) .... One and half tabs by mouth three times a day 7)  Diazepam 10 Mg Tabs (Diazepam) .... One tab by mouth four times daily 8)  Amphetamine-dextroamphetamine 10 Mg Tabs (Amphetamine-dextroamphetamine) .... One tab by mouth two times a day 9)  Aspirin Ec 81 Mg Tbec (Aspirin) .... One tab by mouth once daily 10)  Lotensin 10 Mg Tabs (Benazepril hcl) .... One tab by mouth once daily 11)  Betaseron 0.3 Mg Solr (Interferon beta-1b) .... One injection subcutaneously every other day 12)  Endocet 10-325 Mg Tabs (Oxycodone-acetaminophen) .... One tab by mouth every 6 to 8 hours as needed for pain 13)  Fish Oil 1000 Mg Caps (Omega-3 fatty acids) .... One cap by  mouth once daily 14)  Multivitamins Tabs (Multiple vitamin) .... One tab by mouth once daily 15)  Omeprazole 20 Mg Cpdr (Omeprazole) .... One cap by mouth two times a day 16)  Amlodipine Besylate 5 Mg Tabs (Amlodipine besylate) .... Take 1 daily  Patient Instructions: 1)  Please schedule a follow-up appointment in 2 months. 2)  Continue taking Amlodipine daily. 3)  Keep your appt with your GI Dr next week.   4)  Decrease you carbohydrate intake.  5)  I will refer you to a dietician.

## 2010-06-10 NOTE — Miscellaneous (Signed)
Summary: Home Care Report  Home Care Report   Imported By: Lind Guest 09/25/2009 09:03:22  _____________________________________________________________________  External Attachment:    Type:   Image     Comment:   External Document

## 2010-06-10 NOTE — Procedures (Signed)
Summary: Gastroenterology  Gastroenterology   Imported By: Lind Guest 05/27/2009 14:47:21  _____________________________________________________________________  External Attachment:    Type:   Image     Comment:   External Document

## 2010-06-10 NOTE — Progress Notes (Signed)
Summary: [facility]  [facility]   Imported By: Lind Guest 09/12/2009 13:37:42  _____________________________________________________________________  External Attachment:    Type:   Image     Comment:   External Document

## 2010-06-10 NOTE — Progress Notes (Signed)
  Phone Note Call from Patient   Caller: Patient Summary of Call: states she has had headache since last monday, very painful, could it be the bp med? Initial call taken by: Worthy Keeler LPN,  June 11, 2009 3:55 PM  Follow-up for Phone Call        Tell pt she could try discontinuing the BP med for 2-3 days & see if HA resolves.  Also please make sure she has not had a head injury or any other neurologic symptoms such as visual changes, numbness or tingling. If she has any of these she needs to go to ER.  If HA resolves with D/C of BP med notify us for change of BP med.  If HA persists will need appt. Follow-up by: Esperanza Sheets PA,  June 11, 2009 4:17 PM  Additional Follow-up for Phone Call Additional follow up Details #1::        states she has ms so sometimes does have those symptoms but did notice HA with new bp med, will d/c and let us know Additional Follow-up by: Worthy Keeler LPN,  June 11, 2009 4:26 PM

## 2010-06-10 NOTE — Progress Notes (Signed)
Summary: MEDICINE  Phone Note Call from Patient   Summary of Call: IN THE HOSPITAL 4.5.11 THROUGH  4.13.11  TOOK HER OFF HER AMOL  BP MEDICINE     AND SHE IS HAVING PHYSICAL THERAPY AND FRIDAY  IT WAS 198/120 THEN AFTER 3 TIMES WENT DOWN TO 160/100 AND SO SHE STARTED TAKING THE MEDICINE AGAIN AND WANTS TO KNOW IS THIS ALLRIGHT Initial call taken by: Lind Guest,  Sep 23, 2009 9:47 AM  Follow-up for Phone Call        sdounds ok, but pls sched hosp f/u with pA this week to check on BP etc, I will letpa know also Follow-up by: Syliva Overman MD,  Sep 23, 2009 4:47 PM  Additional Follow-up for Phone Call Additional follow up Details #1::        she was yesterday  Additional Follow-up by: Lind Guest,  Sep 25, 2009 11:53 AM

## 2010-06-10 NOTE — Assessment & Plan Note (Signed)
Summary: F UP   Vital Signs:  Patient profile:   50 year old female Height:      69.5 inches Weight:      231.50 pounds BMI:     33.82 O2 Sat:      98 % on Room air Pulse rate:   64 / minute Pulse rhythm:   regular Resp:     16 per minute BP sitting:   122 / 80  (left arm)  Vitals Entered By: Adella Hare LPN (February 27, 2010 2:14 PM)  Nutrition Counseling: Patient's BMI is greater than 25 and therefore counseled on weight management options.  O2 Flow:  Room air CC: follow-up visit Is Patient Diabetic? No Pain Assessment Patient in pain? no        Primary Care Zekiel Torian:  Syliva Overman MD  CC:  follow-up visit.  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 Denies recent fever or chills. Denies sinus pressure, nasal congestion , ear pain or sore throat. Denies chest congestion, or cough productive of sputum. Denies chest pain, palpitations, PND, orthopnea or leg swelling. Denies abdominal pain, nausea, vomitting, diarrhea or constipation. Denies change in bowel movements or bloody stool. Denies dysuria , frequency, incontinence or hesitancy.  Denies headaches, vertigo, seizures.  Denies  rash, lesions, or itch.     Allergies (verified): 1)  ! Pcn  Review of Systems      See HPI General:  Complains of fatigue and weakness. Eyes:  Denies discharge and red eye. MS:  Complains of joint pain, low back pain, and mid back pain. Neuro:  Complains of falling down, memory loss, poor balance, and weakness. Psych:  Complains of anxiety, depression, and mental problems; denies sense of great danger, suicidal thoughts/plans, thoughts of violence, and thoughts /plans of harming others; symptoms have improved significantly. Endo:  Complains of cold intolerance; denies excessive thirst, excessive urination, and heat intolerance. Heme:  Denies abnormal bruising  and bleeding. Allergy:  Denies hives or rash and itching eyes.  Physical Exam  General:  Well-developed,obese,in no acute distress; alert,appropriate and cooperative throughout examination HEENT: No facial asymmetry,  EOMI, No sinus tenderness, TM's Clear, oropharynx  pink and moist.   Chest: Clear to auscultation bilaterally.  CVS: S1, S2, No murmurs, No S3.   Abd: Soft,mild epigastric tenderness, no guarding or rebound MS: deceased ROM spine, hips, shoulders and knees. Uses a walker, left foot everted Ext: No edema.   CNS: CN 2-12 intact,decreased power and  tone l throughout. Sensation nl  Skin: Intact, no visible lesions or rashes.  Psych: Good eye contact, normal affect.  Memory intact,not  nxious or  depressed appearing.    Impression & Recommendations:  Problem # 1:  OBESITY, UNSPECIFIED (ICD-278.00) Assessment Improved  Ht: 69.5 (02/27/2010)   Wt: 231.50 (02/27/2010)   BMI: 33.82 (02/27/2010)  Problem # 2:  DEPRESSION (ICD-311) Assessment: Improved  Her updated medication list for this problem includes:    Diazepam 10 Mg Tabs (Diazepam) ..... One tab by mouth four times daily    Lexapro 10 Mg Tabs (Escitalopram oxalate) .Marland Kitchen... Take 1 tablet by mouth once a day  Problem # 3:  HYPERLIPIDEMIA (ICD-272.4) Assessment: Comment Only    HDL:57 (06/10/2009)  LDL:87 (06/10/2009)  Chol:186 (06/10/2009)  Trig:212 (06/10/2009) Low fat dietdiscussed and encouraged  Problem # 4:  HYPERTENSION (ICD-401.9) Assessment: Unchanged  Her updated medication list for this problem  includes:    Lotensin 10 Mg Tabs (Benazepril hcl) ..... One tab by mouth once daily    Amlodipine Besylate 5 Mg Tabs (Amlodipine besylate) .Marland Kitchen... Take 1 daily  BP today: 122/80 Prior BP: 114/82 (11/26/2009)  Labs Reviewed: K+: 4.1 (06/10/2009) Creat: : 0.67 (06/10/2009)   Chol: 186 (06/10/2009)   HDL: 57 (06/10/2009)   LDL: 87 (06/10/2009)   TG: 212 (06/10/2009)  Problem # 5:  MULTIPLE SCLEROSIS  (ICD-340) Assessment: Improved followed closely by neurology  Problem # 6:  GERD (ICD-530.81)  Her updated medication list for this problem includes:    Lansoprazole 30 Mg Cpdr (Lansoprazole) .Marland Kitchen... Take 1 tablet by mouth two times a day followed by gI, may have Barret's  Complete Medication List: 1)  Acetaminophen 325 Mg Tabs (Acetaminophen) .... Two tabs by mouth every four hours as needed 2)  Baclofen 10 Mg Tabs (Baclofen) .... Two tabs by mouth every 6 hours 3)  Docusate Sodium 100 Mg Caps (Docusate sodium) .... One cap by mouth two times a day 4)  Gabapentin 800 Mg Tabs (Gabapentin) .... One tab by mouth every 6 hours 5)  Ibuprofen 400 Mg Tabs (Ibuprofen) .... One tab by mouth every four hours as needed 6)  Methocarbamol 500 Mg Tabs (Methocarbamol) .... One and half tabs by mouth three times a day 7)  Diazepam 10 Mg Tabs (Diazepam) .... One tab by mouth four times daily 8)  Amphetamine-dextroamphetamine 10 Mg Tabs (Amphetamine-dextroamphetamine) .... One tab by mouth two times a day 9)  Aspirin Ec 81 Mg Tbec (Aspirin) .... One tab by mouth once daily 10)  Lotensin 10 Mg Tabs (Benazepril hcl) .... One tab by mouth once daily 11)  Betaseron 0.3 Mg Solr (Interferon beta-1b) .... One injection subcutaneously every other day 12)  Endocet 10-325 Mg Tabs (Oxycodone-acetaminophen) .... One tab by mouth every 6 to 8 hours as needed for pain 13)  Fish Oil 1000 Mg Caps (Omega-3 fatty acids) .... One cap by mouth once daily 14)  Multivitamins Tabs (Multiple vitamin) .... One tab by mouth once daily 15)  Lansoprazole 30 Mg Cpdr (Lansoprazole) .... Take 1 tablet by mouth two times a day 16)  Amlodipine Besylate 5 Mg Tabs (Amlodipine besylate) .... Take 1 daily 17)  Lexapro 10 Mg Tabs (Escitalopram oxalate) .... Take 1 tablet by mouth once a day  Patient Instructions: 1)  Please schedule a follow-up appointment in 4 months. 2)  It is important that you exercise regularly at least 20 minutes 5  times a week. If you develop chest pain, have severe difficulty breathing, or feel very tired , stop exercising immediately and seek medical attention. 3)  You need to lose weight. Consider a lower calorie diet and regular exercise. congrats on the weight you have lost, pls keep it up. 4)  You will be referred  for carotid doppler studies to ensure no blockage in your neck arteries next year, unless you start having symptoms of near black out spells.   Orders Added: 1)  Est. Patient Level IV [16109]

## 2010-06-10 NOTE — Procedures (Signed)
Summary: Gastroenterology  Gastroenterology   Imported By: Lind Guest 11/20/2009 10:16:45  _____________________________________________________________________  External Attachment:    Type:   Image     Comment:   External Document

## 2010-06-10 NOTE — Miscellaneous (Signed)
Summary: Home Care Report  Home Care Report   Imported By: Lind Guest 09/05/2009 09:35:58  _____________________________________________________________________  External Attachment:    Type:   Image     Comment:   External Document

## 2010-06-10 NOTE — Letter (Signed)
Summary: DTO DR. Gerilyn Pilgrim  DTO DR. Gerilyn Pilgrim   Imported By: Lind Guest 11/27/2009 08:40:23  _____________________________________________________________________  External Attachment:    Type:   Image     Comment:   External Document

## 2010-07-01 ENCOUNTER — Encounter: Payer: Self-pay | Admitting: Family Medicine

## 2010-07-01 ENCOUNTER — Ambulatory Visit (INDEPENDENT_AMBULATORY_CARE_PROVIDER_SITE_OTHER): Payer: MEDICARE | Admitting: Family Medicine

## 2010-07-01 DIAGNOSIS — R5383 Other fatigue: Secondary | ICD-10-CM | POA: Insufficient documentation

## 2010-07-01 DIAGNOSIS — R5381 Other malaise: Secondary | ICD-10-CM | POA: Insufficient documentation

## 2010-07-01 DIAGNOSIS — F329 Major depressive disorder, single episode, unspecified: Secondary | ICD-10-CM

## 2010-07-01 DIAGNOSIS — E785 Hyperlipidemia, unspecified: Secondary | ICD-10-CM

## 2010-07-01 DIAGNOSIS — G35 Multiple sclerosis: Secondary | ICD-10-CM

## 2010-07-01 DIAGNOSIS — I10 Essential (primary) hypertension: Secondary | ICD-10-CM

## 2010-07-02 ENCOUNTER — Encounter: Payer: Self-pay | Admitting: Family Medicine

## 2010-07-04 ENCOUNTER — Encounter: Payer: Self-pay | Admitting: Family Medicine

## 2010-07-08 NOTE — Letter (Signed)
Summary: chronic disease referral  chronic disease referral   Imported By: Lind Guest 07/03/2010 07:54:58  _____________________________________________________________________  External Attachment:    Type:   Image     Comment:   External Document

## 2010-07-09 LAB — CONVERTED CEMR LAB
CO2: 27 meq/L (ref 19–32)
Chloride: 104 meq/L (ref 96–112)
Cholesterol: 175 mg/dL (ref 0–200)
Creatinine, Ser: 0.86 mg/dL (ref 0.40–1.20)
Eosinophils Relative: 5 % (ref 0–5)
HCT: 39.5 % (ref 36.0–46.0)
HDL: 52 mg/dL (ref >39)
Hemoglobin: 12.8 g/dL (ref 12.0–15.0)
MCV: 92.5 fL (ref 78.0–100.0)
Neutrophils Relative %: 49 % (ref 43–77)
Platelets: 237 10*3/uL (ref 150–400)
Potassium: 4.3 meq/L (ref 3.5–5.3)
Triglycerides: 160 mg/dL — ABNORMAL HIGH (ref <150)
VLDL: 32 mg/dL (ref 0–40)
WBC: 4.1 10*3/uL (ref 4.0–10.5)

## 2010-07-10 ENCOUNTER — Telehealth: Payer: Self-pay | Admitting: Family Medicine

## 2010-07-15 ENCOUNTER — Encounter: Payer: Self-pay | Admitting: Family Medicine

## 2010-07-17 NOTE — Assessment & Plan Note (Signed)
Summary: F UP 4 MONTHS   Vital Signs:  Patient profile:   50 year old female Height:      69.5 inches Weight:      242 pounds BMI:     35.35 O2 Sat:      97 % Pulse rate:   96 / minute Pulse rhythm:   regular Resp:     16 per minute BP sitting:   120 / 88  (left arm) Cuff size:   large  Vitals Entered By: Everitt Amber LPN (July 01, 2010 3:52 PM) CC: Follow up chronic problems    Primary Care Provider:  Syliva Overman MD  CC:  Follow up chronic problems .  History of Present Illness: Concerned about progressive weight gain, despite attempts at weight loss, frustrated due to lack of weight loss and inability of execise.  Denies recent fever or chills. Denies sinus pressure, nasal congestion , ear pain or sore throat. Denies chest congestion, or cough productive of sputum. Denies chest pain, palpitations, PND, orthopnea or leg swelling. Denies abdominal pain, nausea, vomitting, diarrhea or constipation. Denies change in bowel movements or bloody stool.   Denies vertigo, seizures. Denies uncontrolled depression, anxiety or insomnia.These symptoms are improved since last visit Denies  rash, lesions, or itch.      Current Medications (verified): 1)  Acetaminophen 325 Mg Tabs (Acetaminophen) .... Two Tabs By Mouth Every Four Hours As Needed 2)  Baclofen 10 Mg Tabs (Baclofen) .... Two Tabs By Mouth Every 6 Hours 3)  Docusate Sodium 100 Mg Caps (Docusate Sodium) .... One Cap By Mouth Two Times A Day 4)  Gabapentin 800 Mg Tabs (Gabapentin) .... One Tab By Mouth Every 6 Hours 5)  Ibuprofen 400 Mg Tabs (Ibuprofen) .... One Tab By Mouth Every Four Hours As Needed 6)  Methocarbamol 500 Mg Tabs (Methocarbamol) .... One and Half Tabs By Mouth Three Times A Day 7)  Diazepam 10 Mg Tabs (Diazepam) .... One Tab By Mouth Four Times Daily 8)  Amphetamine-Dextroamphetamine 10 Mg Tabs (Amphetamine-Dextroamphetamine) .... One Tab By Mouth Two Times A Day 9)  Aspirin Ec 81 Mg Tbec  (Aspirin) .... One Tab By Mouth Once Daily 10)  Lotensin 10 Mg Tabs (Benazepril Hcl) .... One Tab By Mouth Once Daily 11)  Betaseron 0.3 Mg Solr (Interferon Beta-1b) .... One Injection Subcutaneously Every Other Day 12)  Endocet 10-325 Mg Tabs (Oxycodone-Acetaminophen) .... One Tab By Mouth Every 6 To 8 Hours As Needed For Pain 13)  Flax Seed Oil 1000 Mg Caps (Flaxseed (Linseed)) .... Take 1 Tablet By Mouth Once A Day 14)  Multivitamins  Tabs (Multiple Vitamin) .... One Tab By Mouth Once Daily 15)  Lansoprazole 30 Mg Cpdr (Lansoprazole) .... Take 1 Tablet By Mouth Two Times A Day 16)  Amlodipine Besylate 5 Mg Tabs (Amlodipine Besylate) .... Take 1 Daily 17)  Celexa 20 Mg Tabs (Citalopram Hydrobromide) .... Take 1 Tablet By Mouth Once A Day 18)  Pantoprazole Sodium 40 Mg Tbec (Pantoprazole Sodium) .... Take 1 Tablet By Mouth Two Times A Day  Allergies (verified): 1)  ! Pcn  Review of Systems      See HPI General:  Complains of fatigue and sleep disorder. Eyes:  Denies discharge. MS:  Complains of muscle weakness and stiffness. Neuro:  Complains of falling down, memory loss, numbness, poor balance, tremors, and weakness; denies seizures, sensation of room spinning, and tingling. Psych:  Complains of anxiety, depression, and mental problems; denies suicidal thoughts/plans, thoughts of violence, and unusual  visions or sounds. Endo:  Denies cold intolerance, excessive hunger, excessive thirst, and excessive urination. Heme:  Denies abnormal bruising and bleeding. Allergy:  Denies hives or rash and itching eyes.  Physical Exam  General:  Well-developed,obese,in no acute distress; alert,appropriate and cooperative throughout examination HEENT: No facial asymmetry,  EOMI, No sinus tenderness, TM's Clear, oropharynx  pink and moist.   Chest: Clear to auscultation bilaterally.  CVS: S1, S2, No murmurs, No S3.   Abd: Soft, Nontender.  MS: decreased  ROM spine, hips, shoulders and knees.  Ext:  No edema.   CNS: CN 2-12 intact,reduced  power n  and tone reduced throughout  Skin: Intact, no visible lesions or rashes.  Psych: Good eye contact, normal affect.  Memory fair mildly anxious not  depressed appearing.    Impression & Recommendations:  Problem # 1:  OBESITY, UNSPECIFIED (ICD-278.00) Assessment Deteriorated  Ht: 69.5 (07/01/2010)   Wt: 242 (07/01/2010)   BMI: 35.35 (07/01/2010) therapeutic lifestyle change discussed and encouraged  Problem # 2:  DEPRESSION (ICD-311) Assessment: Improved  Her updated medication list for this problem includes:    Diazepam 10 Mg Tabs (Diazepam) ..... One tab by mouth four times daily    Celexa 20 Mg Tabs (Citalopram hydrobromide) .Marland Kitchen... Take 1 tablet by mouth once a day  Problem # 3:  HYPERTENSION (ICD-401.9) Assessment: Unchanged    HDL:57 (06/10/2009)  LDL:87 (06/10/2009)  Chol:186 (06/10/2009)  Trig:212 (06/10/2009)  Her updated medication list for this problem includes:    Lotensin 10 Mg Tabs (Benazepril hcl) ..... One tab by mouth once daily    Amlodipine Besylate 5 Mg Tabs (Amlodipine besylate) .Marland Kitchen... Take 1 daily  BP today: 120/88 Prior BP: 122/80 (02/27/2010)  Labs Reviewed: K+: 4.1 (06/10/2009) Creat: : 0.67 (06/10/2009)   Chol: 186 (06/10/2009)   HDL: 57 (06/10/2009)   LDL: 87 (06/10/2009)   TG: 212 (06/10/2009)  Problem # 4:  MULTIPLE SCLEROSIS (ICD-340) Assessment: Unchanged followed by neurology, no flares since last visit  Complete Medication List: 1)  Acetaminophen 325 Mg Tabs (Acetaminophen) .... Two tabs by mouth every four hours as needed 2)  Baclofen 10 Mg Tabs (Baclofen) .... Two tabs by mouth every 6 hours 3)  Docusate Sodium 100 Mg Caps (Docusate sodium) .... One cap by mouth two times a day 4)  Gabapentin 800 Mg Tabs (Gabapentin) .... One tab by mouth every 6 hours 5)  Ibuprofen 400 Mg Tabs (Ibuprofen) .... One tab by mouth every four hours as needed 6)  Methocarbamol 500 Mg Tabs (Methocarbamol) ....  One and half tabs by mouth three times a day 7)  Diazepam 10 Mg Tabs (Diazepam) .... One tab by mouth four times daily 8)  Amphetamine-dextroamphetamine 10 Mg Tabs (Amphetamine-dextroamphetamine) .... One tab by mouth two times a day 9)  Aspirin Ec 81 Mg Tbec (Aspirin) .... One tab by mouth once daily 10)  Lotensin 10 Mg Tabs (Benazepril hcl) .... One tab by mouth once daily 11)  Betaseron 0.3 Mg Solr (Interferon beta-1b) .... One injection subcutaneously every other day 12)  Endocet 10-325 Mg Tabs (Oxycodone-acetaminophen) .... One tab by mouth every 6 to 8 hours as needed for pain 13)  Flax Seed Oil 1000 Mg Caps (Flaxseed (linseed)) .... Take 1 tablet by mouth once a day 14)  Multivitamins Tabs (Multiple vitamin) .... One tab by mouth once daily 15)  Amlodipine Besylate 5 Mg Tabs (Amlodipine besylate) .... Take 1 daily 16)  Celexa 20 Mg Tabs (Citalopram hydrobromide) .... Take 1 tablet  by mouth once a day 17)  Pantoprazole Sodium 40 Mg Tbec (Pantoprazole sodium) .... Take 1 tablet by mouth two times a day  Other Orders: Medicare Electronic Prescription [phone]) T-Basic Metabolic Panel [phone]) T-Lipid Profile [phone]) T-TSH [id number]) T-CBC w/Diff [phone])  Patient Instructions: 1)  Please schedule a follow-up appointment in 4 months. 2)  Pls start a food diary and try to restrict your calories to 1500 cal/day. 3)  You will benefit from seeing a nutrtionist. 4)  BMP prior to visit, ICD-9: 5)  Lipid Panel prior to visit, ICD-9: fasting asap 6)  TSH prior to visit, ICD-9: 7)  CBC w/ Diff prior to visit, ICD-9: Prescriptions: LOTENSIN 10 MG TABS (BENAZEPRIL HCL) one tab by mouth once daily  #90 x 1   Entered by:   Everitt Amber LPN   Authorized by:   Syliva Overman MD   Signed by:   Everitt Amber LPN on 60/02/9322   Method used:   Electronically to        [facility]* (mail-order)       [address]       [place], [place]  [address]       Ph:  [phone]       Fax: [phone]   RxID:   [id number] LOTENSIN 10 MG TABS (BENAZEPRIL HCL) one tab by mouth once daily  #90 x 1   Entered by:   Everitt Amber LPN   Authorized by:   Syliva Overman MD   Signed by:   Everitt Amber LPN on 46/27/0350   Method used:   Printed then faxed to ...       [facility]* (mail-order)       [address]       [place], [place]  [address]       Ph: [phone]       Fax: [phone]   RxID:   [id number]    Orders Added: 1)  Est. Patient Level IV [61443] 2)  Medicare Electronic Prescription [G8553] 3)  T-Basic Metabolic Panel [phone] 4)  T-Lipid Profile [[id number]] 5)  T-TSH [[id number]] 6)  T-CBC w/Diff [[id number]]

## 2010-07-22 NOTE — Letter (Signed)
Summary: outpatient nutritional  outpatient nutritional   Imported By: Lind Guest 07/15/2010 13:30:00  _____________________________________________________________________  External Attachment:    Type:   Image     Comment:   External Document

## 2010-07-22 NOTE — Progress Notes (Signed)
Summary: nutrition  Phone Note Call from Patient   Summary of Call: WAS REFERRED TO [facility] FOR NUTRITION AND THEY WANTED  TO CHARGE 20.00 and dr told her it was free at [facility]. Can she go there call back and let her know Initial call taken by: Lind Guest,  July 10, 2010 5:00 PM  Follow-up for Phone Call        give her the infoo on free diabetic grp sessions, after you check with nutritionist if this is ok , also find out if there is any "free  one on one nutrition classes for obese pts, that what she needs, she is not diabetic Follow-up by: Syliva Overman MD,  July 11, 2010 12:14 PM  Additional Follow-up for Phone Call Additional follow up Details #1::        no charge for nutritional consult Additional Follow-up by: Adella Hare LPN,  July 15, 2010 11:37 AM

## 2010-07-30 LAB — D-DIMER, QUANTITATIVE: D-Dimer, Quant: 0.25 ug/mL-FEU (ref 0.00–0.48)

## 2010-07-30 LAB — GLUCOSE, CAPILLARY
Glucose-Capillary: 100 mg/dL — ABNORMAL HIGH (ref 70–99)
Glucose-Capillary: 104 mg/dL — ABNORMAL HIGH (ref 70–99)
Glucose-Capillary: 106 mg/dL — ABNORMAL HIGH (ref 70–99)
Glucose-Capillary: 108 mg/dL — ABNORMAL HIGH (ref 70–99)
Glucose-Capillary: 110 mg/dL — ABNORMAL HIGH (ref 70–99)
Glucose-Capillary: 121 mg/dL — ABNORMAL HIGH (ref 70–99)
Glucose-Capillary: 124 mg/dL — ABNORMAL HIGH (ref 70–99)
Glucose-Capillary: 145 mg/dL — ABNORMAL HIGH (ref 70–99)
Glucose-Capillary: 163 mg/dL — ABNORMAL HIGH (ref 70–99)
Glucose-Capillary: 204 mg/dL — ABNORMAL HIGH (ref 70–99)

## 2010-07-30 LAB — CBC
MCHC: 34.6 g/dL (ref 30.0–36.0)
MCHC: 35 g/dL (ref 30.0–36.0)
MCV: 91.1 fL (ref 78.0–100.0)
RBC: 3.97 MIL/uL (ref 3.87–5.11)
RBC: 4.07 MIL/uL (ref 3.87–5.11)
RDW: 13.2 % (ref 11.5–15.5)
WBC: 5.5 10*3/uL (ref 4.0–10.5)

## 2010-07-30 LAB — URINALYSIS, ROUTINE W REFLEX MICROSCOPIC
Bilirubin Urine: NEGATIVE
Glucose, UA: 250 mg/dL — AB
Hgb urine dipstick: NEGATIVE
Nitrite: NEGATIVE
Specific Gravity, Urine: 1.02 (ref 1.005–1.030)
Urobilinogen, UA: 0.2 mg/dL (ref 0.0–1.0)
pH: 6 (ref 5.0–8.0)

## 2010-07-30 LAB — BASIC METABOLIC PANEL
BUN: 15 mg/dL (ref 6–23)
CO2: 25 mEq/L (ref 19–32)
Calcium: 9.7 mg/dL (ref 8.4–10.5)
Chloride: 102 mEq/L (ref 96–112)
Creatinine, Ser: 0.78 mg/dL (ref 0.4–1.2)
GFR calc Af Amer: >60 mL/min (ref >60)
GFR calc Af Amer: >60 mL/min (ref >60)
GFR calc non Af Amer: >60 mL/min (ref >60)
Glucose, Bld: 116 mg/dL — ABNORMAL HIGH (ref 70–99)
Potassium: 3.9 mEq/L (ref 3.5–5.1)
Sodium: 141 mEq/L (ref 135–145)

## 2010-07-30 LAB — VITAMIN D 1,25 DIHYDROXY: Vitamin D 1, 25 (OH)2 Total: 57 pg/mL (ref 18–72)

## 2010-07-30 LAB — DIFFERENTIAL
Basophils Relative: 0 % (ref 0–1)
Basophils Relative: 1 % (ref 0–1)
Eosinophils Absolute: 0 10*3/uL (ref 0.0–0.7)
Eosinophils Absolute: 0.2 10*3/uL (ref 0.0–0.7)
Eosinophils Relative: 3 % (ref 0–5)
Lymphocytes Relative: 40 % (ref 12–46)
Monocytes Relative: 5 % (ref 3–12)
Monocytes Relative: 6 % (ref 3–12)
Neutrophils Relative %: 70 % (ref 43–77)

## 2010-07-30 LAB — COMPREHENSIVE METABOLIC PANEL
ALT: 33 U/L (ref 0–35)
BUN: 10 mg/dL (ref 6–23)
Calcium: 9 mg/dL (ref 8.4–10.5)
Creatinine, Ser: 0.83 mg/dL (ref 0.4–1.2)
Glucose, Bld: 144 mg/dL — ABNORMAL HIGH (ref 70–99)
Sodium: 132 mEq/L — ABNORMAL LOW (ref 135–145)
Total Protein: 7.4 g/dL (ref 6.0–8.3)

## 2010-07-30 LAB — TSH: TSH: 1.302 u[IU]/mL (ref 0.350–4.500)

## 2010-09-19 ENCOUNTER — Other Ambulatory Visit: Payer: Self-pay

## 2010-09-19 DIAGNOSIS — I10 Essential (primary) hypertension: Secondary | ICD-10-CM

## 2010-09-19 MED ORDER — BENAZEPRIL HCL 10 MG PO TABS: 10.0000 mg | ORAL_TABLET | Freq: Every day | ORAL | Status: DC

## 2010-10-29 ENCOUNTER — Encounter: Payer: Self-pay | Admitting: Family Medicine

## 2010-10-29 ENCOUNTER — Ambulatory Visit (INDEPENDENT_AMBULATORY_CARE_PROVIDER_SITE_OTHER): Payer: Medicare Other | Admitting: Family Medicine

## 2010-10-29 VITALS — BP 120/86 | HR 77 | Resp 16 | Ht 69.5 in | Wt 233.0 lb

## 2010-10-29 DIAGNOSIS — I10 Essential (primary) hypertension: Secondary | ICD-10-CM

## 2010-10-29 DIAGNOSIS — E669 Obesity, unspecified: Secondary | ICD-10-CM

## 2010-10-29 DIAGNOSIS — G35 Multiple sclerosis: Secondary | ICD-10-CM

## 2010-10-29 DIAGNOSIS — E785 Hyperlipidemia, unspecified: Secondary | ICD-10-CM

## 2010-10-29 DIAGNOSIS — K219 Gastro-esophageal reflux disease without esophagitis: Secondary | ICD-10-CM

## 2010-10-29 DIAGNOSIS — Z23 Encounter for immunization: Secondary | ICD-10-CM

## 2010-10-29 NOTE — Patient Instructions (Signed)
F/U in 4 months.  It is important that you exercise regularly at least 30 minutes 5 times a week. If you develop chest pain, have severe difficulty breathing, or feel very tired, stop exercising immediately and seek medical attention    A healthy diet is rich in fruit, vegetables and whole grains. Poultry fish, nuts and beans are a healthy choice for protein rather then red meat. A low sodium diet and drinking 64 ounces of water daily is generally recommended. Oils and sweet should be limited. Carbohydrates especially for those who are diabetic or overweight, should be limited to 34-45 gram per meal. It is important to eat on a regular schedule, at least 3 times daily. Snacks should be primarily fruits, vegetables or nuts.   Fasting lipid, chem 7  pls schedule your mammogram asap at [facility]  CONGRATS on weight loss, 9 pounds.

## 2010-11-03 ENCOUNTER — Telehealth: Payer: Self-pay | Admitting: Family Medicine

## 2010-11-03 NOTE — Telephone Encounter (Signed)
Called patient left message

## 2010-11-04 NOTE — Telephone Encounter (Signed)
She said when she was here that you offered to write an rx for nasal spray and she said no but now she wants it and also she has bad eczema and wants a cream for it also. [facility]

## 2010-11-13 NOTE — Assessment & Plan Note (Signed)
Triglycerides elevated. Low fat die discussed  And stressed

## 2010-11-13 NOTE — Assessment & Plan Note (Signed)
Improved. Pt applauded on succesful weight loss through lifestyle change, and encouraged to continue same. Weight loss goal set for the next several months.  

## 2010-11-13 NOTE — Assessment & Plan Note (Signed)
Controlled, no change in medication  

## 2010-11-13 NOTE — Assessment & Plan Note (Signed)
Stable currently, no recent flare, followed closely by neurology

## 2010-11-13 NOTE — Progress Notes (Signed)
  Subjective:    Patient ID: Natalie Campos, female    DOB: 1960-05-22, 50 y.o.   MRN: [id number]  HPI The PT is here for follow up and re-evaluation of chronic medical conditions, medication management and review of recent lab and radiology data.  Preventive health is updated, specifically  Cancer screening, and Immunization.   Questions or concerns regarding consultations or procedures which the PT has had in the interim are  addressed. The PT denies any adverse reactions to current medications since the last visit.  Pt reports that she has been working at weight loss through dietary change and is pleased with the result. Her MS has been relatively stable since her last visit with no recent flares     Review of Systems Denies recent fever or chills. Denies sinus pressure, nasal congestion, ear pain or sore throat. Denies chest congestion, productive cough or wheezing. Denies chest pains, palpitations, paroxysmal nocturnal dyspnea, orthopnea and leg swelling Denies abdominal pain, nausea, vomiting,diarrhea or constipation. . Denies dysuria, frequency, hesitancy or incontinence. Chronic joint pain with lower extremity weakness Denies headaches, seizure, numbness, or tingling. Denies depression, anxiety or insomnia. Denies skin break down or rash.        Objective:   Physical Exam Patient alert and oriented and in no Cardiopulmonary distress.  HEENT: No facial asymmetry, EOMI, no sinus tenderness, TM's clear, Oropharynx pink and moist.  Neck supple no adenopathy.  Chest: Clear to auscultation bilaterally.  CVS: S1, S2 no murmurs, no S3.  ABD: Soft non tender. Bowel sounds normal.  Ext: No edema  MS: decreased  ROM spine, shoulders, hips and knees.  Skin: Intact, no ulcerations or rash noted.  Psych: Good eye contact, normal affect. Memory intact not anxious or depressed appearing.  CNS: CN 2-12 intact,       Assessment & Plan:

## 2010-11-21 ENCOUNTER — Encounter (INDEPENDENT_AMBULATORY_CARE_PROVIDER_SITE_OTHER): Payer: Self-pay | Admitting: Internal Medicine

## 2010-11-24 ENCOUNTER — Other Ambulatory Visit: Payer: Self-pay | Admitting: Family Medicine

## 2010-11-24 DIAGNOSIS — L309 Dermatitis, unspecified: Secondary | ICD-10-CM

## 2010-11-24 MED ORDER — FLUTICASONE PROPIONATE 50 MCG/ACT NA SUSP: 1.0000 | Freq: Every day | NASAL | Status: DC

## 2010-11-24 MED ORDER — BETAMETHASONE VALERATE 0.1 % EX OINT: TOPICAL_OINTMENT | Freq: Two times a day (BID) | CUTANEOUS | Status: DC

## 2010-11-24 NOTE — Telephone Encounter (Signed)
Prescriptions have been entered, pls send and let her know

## 2010-11-25 NOTE — Telephone Encounter (Signed)
Patient aware, meds sent

## 2010-12-04 ENCOUNTER — Encounter (INDEPENDENT_AMBULATORY_CARE_PROVIDER_SITE_OTHER): Payer: Self-pay | Admitting: *Deleted

## 2010-12-15 ENCOUNTER — Other Ambulatory Visit (INDEPENDENT_AMBULATORY_CARE_PROVIDER_SITE_OTHER): Payer: Self-pay | Admitting: *Deleted

## 2010-12-15 DIAGNOSIS — K2270 Barrett's esophagus without dysplasia: Secondary | ICD-10-CM

## 2010-12-17 ENCOUNTER — Telehealth (INDEPENDENT_AMBULATORY_CARE_PROVIDER_SITE_OTHER): Payer: Self-pay | Admitting: *Deleted

## 2010-12-17 NOTE — Telephone Encounter (Signed)
EGD w/ biopsy sch'd 01/28/11 @ 2:00 (1:00), asa 2 days, new instruction sheet mailed to pt

## 2011-01-02 LAB — BASIC METABOLIC PANEL
CO2: 28 mEq/L (ref 19–32)
Calcium: 9.6 mg/dL (ref 8.4–10.5)
Creat: 0.8 mg/dL (ref 0.50–1.10)
Glucose, Bld: 91 mg/dL (ref 70–99)

## 2011-01-02 LAB — LIPID PANEL
Cholesterol: 171 mg/dL (ref 0–200)
HDL: 49 mg/dL (ref >39)
Triglycerides: 268 mg/dL — ABNORMAL HIGH (ref <150)

## 2011-01-27 MED ORDER — SODIUM CHLORIDE 0.45 % IV SOLN
Freq: Once | INTRAVENOUS | Status: AC
  Administered 2011-01-28: 14:00:00 via INTRAVENOUS

## 2011-01-28 ENCOUNTER — Encounter (HOSPITAL_COMMUNITY): Payer: Self-pay | Admitting: *Deleted

## 2011-01-28 ENCOUNTER — Ambulatory Visit (HOSPITAL_COMMUNITY)
Admission: RE | Admit: 2011-01-28 | Discharge: 2011-01-28 | Disposition: A | Discharge status: Home / Self Care | Payer: Medicare Other | Source: Ambulatory Visit | Attending: Internal Medicine | Admitting: Internal Medicine

## 2011-01-28 ENCOUNTER — Encounter (HOSPITAL_COMMUNITY)
Admission: RE | Disposition: A | Discharge status: Home / Self Care | Payer: Self-pay | Source: Ambulatory Visit | Attending: Internal Medicine

## 2011-01-28 ENCOUNTER — Other Ambulatory Visit (INDEPENDENT_AMBULATORY_CARE_PROVIDER_SITE_OTHER): Payer: Self-pay | Admitting: Internal Medicine

## 2011-01-28 DIAGNOSIS — K2270 Barrett's esophagus without dysplasia: Secondary | ICD-10-CM

## 2011-01-28 DIAGNOSIS — K449 Diaphragmatic hernia without obstruction or gangrene: Secondary | ICD-10-CM

## 2011-01-28 DIAGNOSIS — K2970 Gastritis, unspecified, without bleeding: Secondary | ICD-10-CM

## 2011-01-28 DIAGNOSIS — K259 Gastric ulcer, unspecified as acute or chronic, without hemorrhage or perforation: Secondary | ICD-10-CM

## 2011-01-28 DIAGNOSIS — K2960 Other gastritis without bleeding: Secondary | ICD-10-CM | POA: Insufficient documentation

## 2011-01-28 DIAGNOSIS — K2990 Gastroduodenitis, unspecified, without bleeding: Secondary | ICD-10-CM

## 2011-01-28 HISTORY — PX: ESOPHAGOGASTRODUODENOSCOPY: SHX5428

## 2011-01-28 HISTORY — PX: BIOPSY: SHX5522

## 2011-01-28 SURGERY — EGD (ESOPHAGOGASTRODUODENOSCOPY)
Anesthesia: Moderate Sedation

## 2011-01-28 MED ORDER — MIDAZOLAM HCL 5 MG/5ML IJ SOLN
INTRAMUSCULAR | Status: AC
  Filled 2011-01-28: qty 10

## 2011-01-28 MED ORDER — MEPERIDINE HCL 25 MG/ML IJ SOLN
INTRAMUSCULAR | Status: DC | PRN
  Administered 2011-01-28 (×4): 25 mg via INTRAVENOUS

## 2011-01-28 MED ORDER — MIDAZOLAM HCL 5 MG/5ML IJ SOLN
INTRAMUSCULAR | Status: AC
  Filled 2011-01-28: qty 5

## 2011-01-28 MED ORDER — MEPERIDINE HCL 50 MG/ML IJ SOLN
INTRAMUSCULAR | Status: AC
  Filled 2011-01-28: qty 1

## 2011-01-28 MED ORDER — BUTAMBEN-TETRACAINE-BENZOCAINE 2-2-14 % EX AERO
INHALATION_SPRAY | CUTANEOUS | Status: DC | PRN
  Administered 2011-01-28: 2 via TOPICAL

## 2011-01-28 MED ORDER — SODIUM CHLORIDE 0.9 % IJ SOLN
INTRAMUSCULAR | Status: AC
  Filled 2011-01-28: qty 10

## 2011-01-28 MED ORDER — PROMETHAZINE HCL 25 MG/ML IJ SOLN
INTRAMUSCULAR | Status: AC
  Filled 2011-01-28: qty 1

## 2011-01-28 MED ORDER — PROMETHAZINE HCL 25 MG/ML IJ SOLN
INTRAMUSCULAR | Status: DC | PRN
  Administered 2011-01-28 (×2): 12.5 mg via INTRAVENOUS

## 2011-01-28 MED ORDER — MIDAZOLAM HCL 5 MG/5ML IJ SOLN
INTRAMUSCULAR | Status: DC | PRN
  Administered 2011-01-28 (×3): 2 mg via INTRAVENOUS
  Administered 2011-01-28 (×3): 3 mg via INTRAVENOUS

## 2011-01-28 NOTE — Op Note (Signed)
EGD PROCEDURE REPORT  PATIENT:  Natalie Campos  MR#:  [id number] Birthdate:  [birth year], 50 y.o., female Endoscopist:  Dr. Malissa Hippo, MD Referred By:  Dr. Syliva Overman, MD  Procedure Date: 01/28/2011  Procedure:   EGD  Indications:  Patient is 50 year old Caucasian female with chronic GERD complicated by short segment Barretts esophagus with low-grade dysplasia. Diagnosis was in January 2011 and repeat EGD was in July 2011. She says her symptoms are well controlled with therapy although she has not been able to lose any weight. She is undergoing surveillance EGD.            Informed Consent: Procedure and risks were reviewed with the patient and informed signed was obtained. Medications:  Demerol 100 mg IV Versed 15 mg IV Promethazine 25 mg IV in diluted form. Cetacaine spray topically for oropharyngeal anesthesia  Description of procedure:  The endoscope was introduced through the mouth and advanced to the second portion of the duodenum without difficulty or limitations. The mucosal surfaces were surveyed very carefully during advancement of the scope and upon withdrawal.  Findings:  Esophagus:  Esophageal mucosa was normal. 3 small patches and GE junction with salmon discoloration. These are a few millimeters each. Pictures taken for the record. GEJ:  33 cm Hiatus:  Wide open hiatus located at 38 cm from the incisors. 3 ulcers noted at the level of hiatus along with 2 erosions; largest  ulcer was at 6:00 about 5 x 10 mm. They all had clean base. Stomach:  Stomach was empty and distended very well with insufflation. Fold in the proximal stomach were normal. Examination of mucosa of body was normal. There is patchy edema and erythema to prepyloric mucosa. No erosions or ulcers noted. Angularis, fundus and cardia were examined by retroflexion the scope. Hernia and ulcers could be seen on this view. Duodenum:  Normal bulbar and post bulbar mucosa  Therapeutic/Diagnostic Maneuvers  Performed:  Biopsy taken from the small patches of salmon-colored mucosa routine histology.  Complications:  None  Impression: 3 small patches of salmon-colored mucosa consistent with short segment Barretts esophagus. Biopsy taken from these areas and submitted in one container.  These patches patches; therefore difficult to take target biopsy. Moderate size sliding hiatal hernia. The ulcers and 2 erosions at the level of diaphragmatic hiatus. Nonerosive antral gastritis.  Recommendations:  Anti-reflex measures reinforced. She must lose some weight Patient will continue pantoprazole at 40 mg twice daily. We will check her H. pylori serology today. I will be contacting patient with results of biopsy and blood test.  REHMAN,NAJEEB U  01/28/2011  2:40 PM  CC: Dr. Syliva Overman, MD, MD & Dr. Bonnetta Barry ref. provider found

## 2011-01-28 NOTE — H&P (Signed)
Natalie Campos is an 50 y.o. female.   Chief Complaint: Patient is here for EGD with esophageal biopsy. HPI: Patient is 50 year old Caucasian female who has chronic GERD complicated by short 7 French esophagus. This condition was diagnosed back in January 2011. Biopsy revealed low-grade dysphagia. She had repeat biopsy in July 2011 asterixis therapy and she still had low-grade dysphagia. He is presently not experiencing any heartburn dysphagia or throat symptoms. She is undergoing EGD with biopsy to document stability or regression of low-grade dysplasia. Past Medical History  Diagnosis Date  . Emphysema   . Eczema   . Depression   . Hyperlipidemia   . GERD (gastroesophageal reflux disease)   . IBS (irritable bowel syndrome)     contipation predominant   . DJD (degenerative joint disease)   . Hypertension   . Multiple sclerosis 1990    Dx in 1990 most recent hospitalization for a flareis in 2011   . High triglycerides   . Obesity   . Dog bite july 29,2011    Past Surgical History  Procedure Date  . Cholecystectomy 2010    Dr. Gabriel Cirri   . Wisdom tooth extraction     History reviewed. No pertinent family history. Social History:  reports that she has quit smoking. Her smoking use included Cigarettes. She does not have any smokeless tobacco history on file. She reports that she does not drink alcohol or use illicit drugs.  Allergies:  Allergies  Allergen Reactions  . Penicillins     Medications Prior to Admission  Medication Dose Route Frequency Provider Last Rate Last Dose  . 0.45 % sodium chloride infusion   Intravenous Once Malissa Hippo, MD 20 mL/hr at 01/28/11 1338    . meperidine (DEMEROL) 50 MG/ML injection           . midazolam (VERSED) 5 MG/5ML injection            Medications Prior to Admission  Medication Sig Dispense Refill  . acetaminophen (TYLENOL) 325 MG tablet Take 650 mg by mouth every 4 (four) hours as needed. Two tablets by mouth every 4 hours as needed        . AMLODIPINE BESYLATE PO Take 5 mg by mouth daily. Take one tablet daily          . amphetamine-dextroamphetamine (ADDERALL) 10 MG tablet Take 10 mg by mouth 2 (two) times daily. One tablet by mouth two times a day       . aspirin (ASPIRIN LOW DOSE) 81 MG EC tablet Take 81 mg by mouth daily. One tablet by mouth once daily       . baclofen (LIORESAL) 10 MG tablet Take 10 mg by mouth every 6 (six) hours. 2 tablets by mouth every 6 hours       . benazepril (LOTENSIN) 10 MG tablet Take 1 tablet (10 mg total) by mouth daily.  90 tablet  1  . betamethasone valerate (VALISONE) 0.1 % ointment Apply topically 2 (two) times daily.  30 g  0  . Cholecalciferol (VITAMIN D3) 3000 UNITS TABS Take 1 tablet by mouth daily.        . citalopram (CELEXA) 10 MG tablet Take 10 mg by mouth daily.        . diazepam (VALIUM) 10 MG tablet Take 10 mg by mouth 4 (four) times daily. One tablet by mouth 4 times daily       . docusate sodium (COLACE) 100 MG capsule Take 100 mg by mouth 2 (  two) times daily. One caplet by mouth  Two times a day       . fish oil-omega-3 fatty acids 1000 MG capsule Take by mouth daily. One cap by mouth daily         . fluticasone (FLONASE) 50 MCG/ACT nasal spray Place 1 spray into the nose daily.  16 g  2  . gabapentin (NEURONTIN) 800 MG tablet Take 800 mg by mouth every 6 (six) hours. One tablet by mouth every 6 hours       . hydrochlorothiazide 25 MG tablet Take 25 mg by mouth daily.        . interferon beta-1b (BETASERON) 0.3 MG injection Inject 0.25 mg into the skin every other day. One injection subcutaneously every other day       . METHOCARBAMOL PO Take 500 mg by mouth 3 (three) times daily. One and half tabs by mouth three times a day       . multivitamin (THERAGRAN) per tablet Take 1 tablet by mouth daily. One tablet by mouth once daily       . oxyCODONE-acetaminophen (PERCOCET) 10-325 MG per tablet Take 1 tablet by mouth every 6 (six) hours as needed. One tablet by mouth every 6-8  hours as needed for pain       . pantoprazole (PROTONIX) 40 MG tablet Take 40 mg by mouth 2 (two) times daily.        Marland Kitchen ibuprofen (ADVIL,MOTRIN) 400 MG tablet Take 400 mg by mouth every 4 (four) hours as needed. Take one tablet by mouth q4 hours as needed       . LANSOPRAZOLE PO Take 30 mg by mouth 2 (two) times daily.        . polyethylene glycol (MIRALAX / GLYCOLAX) packet Take 17 g by mouth daily as needed. constipation         No results found for this or any previous visit (from the past 48 hour(s)). No results found.  Review of Systems  Constitutional: Negative for weight loss.  Gastrointestinal: Negative for abdominal pain, diarrhea, constipation, blood in stool and melena.       Patient states her heartburn spell controlled with therapy. She denies dysphagia sore throat or hoarseness. She also denies abdominal pain or melena. She has had no luck losing weight    Blood pressure 118/66, pulse 66, temperature 97.9 F (36.6 C), temperature source Oral, resp. rate 29, height 5\' 9"  (1.753 m), weight 222 lb (100.699 kg), SpO2 98.00%. Physical Exam  Constitutional: She appears well-developed and well-nourished.  HENT:  Mouth/Throat: Oropharynx is clear and moist.  Eyes: Conjunctivae are normal.  Neck: No thyromegaly present.  Cardiovascular: Normal rate, regular rhythm and normal heart sounds.   No murmur heard. Respiratory: Effort normal and breath sounds normal.  GI: Soft. She exhibits no distension and no mass. There is no tenderness.  Musculoskeletal: She exhibits no edema.  Lymphadenopathy:    She has no cervical adenopathy.  Neurological: She is alert.  Skin: Skin is warm.     Assessment/Plan Chronic GERD complicated by short segment Barrett's with low-grade dysplasia. EGD with biopsy  REHMAN,NAJEEB U 01/28/2011, 1:56 PM

## 2011-02-03 ENCOUNTER — Encounter (HOSPITAL_COMMUNITY): Payer: Self-pay | Admitting: Internal Medicine

## 2011-02-06 ENCOUNTER — Encounter (INDEPENDENT_AMBULATORY_CARE_PROVIDER_SITE_OTHER): Payer: Self-pay | Admitting: *Deleted

## 2011-02-25 ENCOUNTER — Other Ambulatory Visit: Payer: Self-pay | Admitting: Family Medicine

## 2011-03-06 ENCOUNTER — Encounter: Payer: Self-pay | Admitting: Family Medicine

## 2011-03-09 ENCOUNTER — Ambulatory Visit: Payer: Medicare Other | Admitting: Family Medicine

## 2011-03-13 ENCOUNTER — Ambulatory Visit (INDEPENDENT_AMBULATORY_CARE_PROVIDER_SITE_OTHER): Payer: Medicare Other | Admitting: Family Medicine

## 2011-03-13 ENCOUNTER — Encounter: Payer: Self-pay | Admitting: Family Medicine

## 2011-03-13 VITALS — BP 110/70 | HR 93 | Resp 16 | Ht 69.0 in | Wt 230.1 lb

## 2011-03-13 DIAGNOSIS — L259 Unspecified contact dermatitis, unspecified cause: Secondary | ICD-10-CM

## 2011-03-13 DIAGNOSIS — E785 Hyperlipidemia, unspecified: Secondary | ICD-10-CM

## 2011-03-13 DIAGNOSIS — I10 Essential (primary) hypertension: Secondary | ICD-10-CM

## 2011-03-13 DIAGNOSIS — G35 Multiple sclerosis: Secondary | ICD-10-CM

## 2011-03-13 DIAGNOSIS — E669 Obesity, unspecified: Secondary | ICD-10-CM

## 2011-03-13 DIAGNOSIS — L309 Dermatitis, unspecified: Secondary | ICD-10-CM

## 2011-03-13 DIAGNOSIS — Z23 Encounter for immunization: Secondary | ICD-10-CM

## 2011-03-13 MED ORDER — INFLUENZA VAC TYPES A & B PF IM SUSP: 0.5000 mL | Freq: Once | INTRAMUSCULAR | Status: DC

## 2011-03-13 MED ORDER — BETAMETHASONE VALERATE 0.1 % EX OINT: TOPICAL_OINTMENT | Freq: Two times a day (BID) | CUTANEOUS | Status: DC

## 2011-03-13 MED ORDER — HYDROCHLOROTHIAZIDE 25 MG PO TABS: 25.0000 mg | ORAL_TABLET | Freq: Every day | ORAL | Status: DC

## 2011-03-13 NOTE — Progress Notes (Signed)
  Subjective:    Patient ID: Natalie Campos, female    DOB: 28-Jun-1960, 50 y.o.   MRN: [id number]  HPI HTN- tolerating BP meds, does not take BP at home, states her BP fluctuates  Hyperlipidemia- has been seen by nutrition, using plate method, avoid fried foods and sugary foods  Overweight- concerned she is not loosing weight, does not eat pasta , red sauce, spicey foods because of Barrotts esophogus. Not able to exercise because of fatigue   Needs refill on eczema cream  Medications reviewed   Review of Systems   GEN- + fatigue, fever, weight loss,weakness, recent illness HEENT- denies eye drainage, change in vision,  CVS- denies chest pain, palpitations, +leg edema at end of day RESP- denies SOB, cough, wheeze ABD- denies N/V, change in stools, abd pain MSK- +joint pain, +muscle aches, injury      Objective:   Physical Exam GEN- NAD, alert and oriented x3 HEENT- PERRL, EOMI,  MMM, oropharynx clear, Neck- Supple, no thyromegaly, no carotis bruit CVS- RRR, no murmur RESP-CTAB ABD- NABS, soft, NT,ND EXT- No edema Pulses- Radial, DP- 2+ Skin- no lesions noted on upper ext       Assessment & Plan:

## 2011-03-13 NOTE — Patient Instructions (Addendum)
Lets start with 10 minutes on treadmill during day- 3 days a week  Your blood pressure looks good, continue your medications Elevate feet at night as much as you can You received your TDAP in June 2012 Today you received your flu shot Next visit in 4 months with Dr. Lodema Hong

## 2011-03-15 ENCOUNTER — Encounter: Payer: Self-pay | Admitting: Family Medicine

## 2011-03-15 NOTE — Assessment & Plan Note (Addendum)
Medication refilled Not currently active

## 2011-03-15 NOTE — Assessment & Plan Note (Signed)
Continue interferon, pt on chronic pain medication

## 2011-03-15 NOTE — Assessment & Plan Note (Signed)
Reiterated importance of diet and weight loss. Take fish oil on a regular basis

## 2011-03-15 NOTE — Assessment & Plan Note (Signed)
Pt states diet is doing okay, she is not exercising, advised a routine, to start slow as she has MS. She does have a treadmill at home

## 2011-03-15 NOTE — Assessment & Plan Note (Signed)
Well controlled, continue meds 

## 2011-03-27 ENCOUNTER — Telehealth: Payer: Self-pay | Admitting: Family Medicine

## 2011-03-30 ENCOUNTER — Telehealth: Payer: Self-pay | Admitting: Family Medicine

## 2011-03-30 DIAGNOSIS — L309 Dermatitis, unspecified: Secondary | ICD-10-CM

## 2011-03-30 MED ORDER — BETAMETHASONE VALERATE 0.1 % EX OINT: TOPICAL_OINTMENT | Freq: Two times a day (BID) | CUTANEOUS | Status: AC

## 2011-03-30 NOTE — Telephone Encounter (Signed)
Sent it in electronically

## 2011-03-30 NOTE — Telephone Encounter (Signed)
Betamethasone was ordered 03/13/2011, pls re order and let pt and pharmacy know

## 2011-06-12 ENCOUNTER — Other Ambulatory Visit: Payer: Self-pay

## 2011-06-12 MED ORDER — POLYETHYLENE GLYCOL 3350 17 G PO PACK: 17.0000 g | PACK | Freq: Every day | ORAL | Status: DC | PRN

## 2011-07-03 NOTE — Telephone Encounter (Signed)
Pt aware.

## 2011-07-07 ENCOUNTER — Other Ambulatory Visit: Payer: Self-pay

## 2011-07-07 DIAGNOSIS — I10 Essential (primary) hypertension: Secondary | ICD-10-CM

## 2011-07-07 MED ORDER — BENAZEPRIL HCL 10 MG PO TABS: 10.0000 mg | ORAL_TABLET | Freq: Every day | ORAL | Status: DC

## 2011-07-14 ENCOUNTER — Ambulatory Visit: Payer: Medicare Other | Admitting: Family Medicine

## 2011-07-27 ENCOUNTER — Other Ambulatory Visit (HOSPITAL_COMMUNITY): Payer: Self-pay | Admitting: Physical Medicine and Rehabilitation

## 2011-07-27 DIAGNOSIS — M545 Low back pain, unspecified: Secondary | ICD-10-CM

## 2011-07-30 ENCOUNTER — Ambulatory Visit (HOSPITAL_COMMUNITY)
Admission: RE | Admit: 2011-07-30 | Discharge: 2011-07-30 | Disposition: A | Discharge status: Home / Self Care | Payer: Medicare Other | Source: Ambulatory Visit | Attending: Physical Medicine and Rehabilitation | Admitting: Physical Medicine and Rehabilitation

## 2011-07-30 DIAGNOSIS — M545 Low back pain, unspecified: Secondary | ICD-10-CM

## 2011-07-30 DIAGNOSIS — M5126 Other intervertebral disc displacement, lumbar region: Secondary | ICD-10-CM | POA: Insufficient documentation

## 2011-07-30 DIAGNOSIS — M25559 Pain in unspecified hip: Secondary | ICD-10-CM | POA: Insufficient documentation

## 2011-10-02 ENCOUNTER — Other Ambulatory Visit: Payer: Self-pay | Admitting: Family Medicine

## 2011-10-16 ENCOUNTER — Encounter: Payer: Self-pay | Admitting: Family Medicine

## 2011-10-16 ENCOUNTER — Ambulatory Visit (INDEPENDENT_AMBULATORY_CARE_PROVIDER_SITE_OTHER): Payer: Medicare Other | Admitting: Family Medicine

## 2011-10-16 VITALS — BP 140/92 | HR 91 | Temp 98.5°F | Resp 16 | Ht 69.0 in | Wt 216.0 lb

## 2011-10-16 DIAGNOSIS — G35 Multiple sclerosis: Secondary | ICD-10-CM

## 2011-10-16 DIAGNOSIS — F329 Major depressive disorder, single episode, unspecified: Secondary | ICD-10-CM

## 2011-10-16 DIAGNOSIS — K589 Irritable bowel syndrome without diarrhea: Secondary | ICD-10-CM

## 2011-10-16 DIAGNOSIS — E785 Hyperlipidemia, unspecified: Secondary | ICD-10-CM

## 2011-10-16 DIAGNOSIS — R42 Dizziness and giddiness: Secondary | ICD-10-CM | POA: Insufficient documentation

## 2011-10-16 DIAGNOSIS — E669 Obesity, unspecified: Secondary | ICD-10-CM

## 2011-10-16 DIAGNOSIS — I10 Essential (primary) hypertension: Secondary | ICD-10-CM

## 2011-10-16 DIAGNOSIS — R5381 Other malaise: Secondary | ICD-10-CM

## 2011-10-16 DIAGNOSIS — T7840XA Allergy, unspecified, initial encounter: Secondary | ICD-10-CM | POA: Insufficient documentation

## 2011-10-16 DIAGNOSIS — Z139 Encounter for screening, unspecified: Secondary | ICD-10-CM

## 2011-10-16 MED ORDER — MECLIZINE HCL 12.5 MG PO TABS: 12.5000 mg | ORAL_TABLET | Freq: Three times a day (TID) | ORAL | Status: DC | PRN

## 2011-10-16 MED ORDER — POTASSIUM CHLORIDE ER 10 MEQ PO TBCR: 10.0000 meq | EXTENDED_RELEASE_TABLET | Freq: Two times a day (BID) | ORAL | Status: DC

## 2011-10-16 NOTE — Assessment & Plan Note (Signed)
No acute flare currently , however potentially had adveres reaction to new medication started on which she took for 10 days, has upcoming appt with neurologist, who also recently decreased the dose of cymbalta he had prescribed

## 2011-10-16 NOTE — Patient Instructions (Addendum)
F/u in 8 weeks.  You will not start any new medication for benazepril , your blood pressure does not require replacement  At this time, though slightly evaluated. Since you have obviously had a severe allergic reaction recently, I am not inclined to start any new medication at this time. You will need to start potassium supplements however and I am [prescribing that.  Temperature is normal, you do not have a fever   I recommend you take the prednisone by mouth as recommended in the ED.   Congrats on weight loss, keep it up.  You will have meclizine prescribed for dizziness as needed.  Please keep appt with Dr Gerilyn Pilgrim  Fasting chem 7, lipid, cbc, tsh , and vit D in 8 weeks before visit please  STOP BENZEPRIL

## 2011-10-16 NOTE — Assessment & Plan Note (Signed)
Marked improvement since yesterday when seen in ED per pt. Advised her to take oral steroid prescribed. ACE allergy documented , though uncertain if this is the culprit, ACE stopped, no replacement med at this tiime

## 2011-10-16 NOTE — Assessment & Plan Note (Signed)
Elevated at this visit, will be changing medication due to potential allergic reaction to ACE, no substitute for this med at this visit since BP not markedly elevated at this time

## 2011-10-16 NOTE — Progress Notes (Signed)
  Subjective:    Patient ID: Natalie Campos, female    DOB: 04/26/1961, 51 y.o.   MRN: [id number]  HPI  Pt in today describng an anaphylactic reaction to medication. Had new med Acthara gel for Ms which she had just used for 10 days, described excessive thirst , dizziness, and multiple potential side effects noted while taking it. Yesterday she developed severe allergic reaction, with swelling of th face and tongue, called Dr Gerilyn Pilgrim who recommended lower dose of cymbalta to 30 mg from 60, later in the day she went to the ED and was advised against ACE inhibitor. Has script for prednisone but states due to her GI conditions she is unable to tolerate oral steroid, causing abdominal pain and diarheah from IBS. Concerned about rash on left side of neck new, willl take the 3 day course of prednisone orally.Questioned whehter to take cymbalta 60mg  as before or the new lower dose per neurology I advised the lower dose per prescriber. C/o dizziness, has had inner ear in the past , denies fever , chills, sinus drainage or cough. She has done exceptionally well with 20 pound weight loss in the past 3 month on nutrisystem, which is excellent   Review of Systems See HPI .Denies  Chills.States she "feels warm", temp requested and checked , she is afebrile Denies sinus pressure, nasal congestion, ear pain or sore throat.C/O swelling of neck  Denies chest congestion, productive cough or wheezing. Denies chest pains, palpitations and leg swelling Denies abdominal pain, nausea, vomiting,diarrhea or constipation. Denies difficulty swallowing  Denies dysuria, frequency, hesitancy or incontinence. Chronic  joint pain,  and limitation in mobility. Denies headaches, seizures, numbness, or tingling. C/o stress primarily due to medical bills, denies uncontrolled depression,  or insomnia. Denies skin break down c/o rash on left side of neck x 1 day        Objective:   Physical Exam Patient alert and oriented  and in no cardiopulmonary distress.  HEENT: No facial asymmetry, EOMI, no sinus tenderness,  oropharynx pink and moist.  Neck adequate ROM, no adenopathy.No obstruction in oropharynx, no swelling of uvula  Chest: Clear to auscultation bilaterally.  CVS: S1, S2 no murmurs, no S3.  ABD: Soft non tender. Bowel sounds normal.  Ext: No edema  MS: Adequate though reduced  ROM spine, shoulders, hips and knees.  Skin: Intact, erythematous macular  rash noted on left neck  Psych: Good eye contact, normal affect. Memory intact mildly  anxious not  depressed appearing.  CNS: CN 2-12 intact, power, tone and sensation normal throughout.        Assessment & Plan:

## 2011-10-16 NOTE — Assessment & Plan Note (Signed)
Improved. Pt applauded on succesful weight loss through lifestyle change, and encouraged to continue same. Weight loss goal set for the next several months.  

## 2011-10-16 NOTE — Assessment & Plan Note (Signed)
1 week h/o intermittent vertigo, denies current sinus pressure or drainage, has had inner ear in the past. No recent viral illness, will treat symptomatically

## 2011-10-16 NOTE — Assessment & Plan Note (Signed)
Reports IBS symptoms with diarheah on oral steroid, however in light of recent severe allergic reaction, and continued facial/neck swelling per pt and new rash on left neck I advised her to take the 3 day course of oral prednisone prescribed at [facility] ED last night. She was given parenteral steroid with good result. She agrees

## 2011-10-16 NOTE — Assessment & Plan Note (Signed)
Increased stress over recent medical bills , still asking if I can do anything to help to clear up mis understannding, will forward request to office manager who she states she has already spoken to. Specific problem is with TDaP

## 2011-10-22 ENCOUNTER — Telehealth: Payer: Self-pay | Admitting: Family Medicine

## 2011-10-22 NOTE — Telephone Encounter (Signed)
I agree with that based on the BP she reports. She needs a visit her in the next 2 weeks to re eval bP, she needs to schedule this

## 2011-10-22 NOTE — Telephone Encounter (Signed)
Hospital took her off her BP pill thinking that was the reason for the anaphylaxsis reaction she had recently (benazepril) BP at [place]'s office was 149/119. Was put on amlodipine 5 mg by you and [place] increased it to 10mg . She wanted to know if you thought that was enough or what you think she needs to do?

## 2011-11-04 NOTE — Telephone Encounter (Signed)
Needs appt to follow up on BP

## 2012-03-03 ENCOUNTER — Other Ambulatory Visit: Payer: Self-pay

## 2012-03-03 DIAGNOSIS — I10 Essential (primary) hypertension: Secondary | ICD-10-CM

## 2012-03-03 MED ORDER — POTASSIUM CHLORIDE ER 10 MEQ PO TBCR: 10.0000 meq | EXTENDED_RELEASE_TABLET | Freq: Two times a day (BID) | ORAL | Status: DC

## 2012-03-03 NOTE — Telephone Encounter (Signed)
meds refilled 

## 2012-03-08 ENCOUNTER — Ambulatory Visit (INDEPENDENT_AMBULATORY_CARE_PROVIDER_SITE_OTHER): Payer: Medicare Other | Admitting: Internal Medicine

## 2012-03-08 ENCOUNTER — Encounter (INDEPENDENT_AMBULATORY_CARE_PROVIDER_SITE_OTHER): Payer: Self-pay | Admitting: Internal Medicine

## 2012-03-08 VITALS — BP 120/80 | HR 76 | Temp 97.5°F | Resp 18 | Ht 69.0 in | Wt 238.3 lb

## 2012-03-08 DIAGNOSIS — R109 Unspecified abdominal pain: Secondary | ICD-10-CM

## 2012-03-08 DIAGNOSIS — K219 Gastro-esophageal reflux disease without esophagitis: Secondary | ICD-10-CM

## 2012-03-08 DIAGNOSIS — K5900 Constipation, unspecified: Secondary | ICD-10-CM

## 2012-03-08 MED ORDER — POLYETHYLENE GLYCOL 3350 17 G PO PACK: 17.0000 g | PACK | Freq: Every day | ORAL | Status: DC | PRN

## 2012-03-08 MED ORDER — DEXLANSOPRAZOLE 60 MG PO CPDR: 60.0000 mg | DELAYED_RELEASE_CAPSULE | Freq: Every day | ORAL | Status: DC

## 2012-03-08 MED ORDER — POLYETHYLENE GLYCOL 3350 17 GM/SCOOP PO POWD: 17.0000 g | Freq: Every day | ORAL | Status: DC

## 2012-03-08 NOTE — Patient Instructions (Addendum)
Discontinue pantoprazole since it is not working. Continue anti-reflux measures. Dexlansoprazole 60 mg by mouth 30 minutes before breakfast daily

## 2012-03-08 NOTE — Progress Notes (Signed)
Presenting complaint;  Followup for GERD.  Subjective:  Patient is 51 year old Caucasian female with chronic GERD complicated by short segment Barrett's esophagus whose last EGD with biopsy was in September 2012 who presents for scheduled visit. She says pantoprazole is not working anymore. She is watching her diet very closely. She eats her evening meal at 4:30 PM. If she eats any later she gets nocturnal regurgitation. She is using a lot of Rolaids and Maalox. She denies dysphagia. Her bowels move regularly as as long as she takes stool softener and consume fiber rich foods. She has had and of bed elevated. She also complains of epigastric and left mid abdominal pain. She has had epigastric pain for one year. She describes as a pressure generally experience after meals. This pain is not associated with nausea or vomiting and may last for several minutes or longer. She also complains of a different pain at left mid abdomen which started 5 or 6 months ago and she feels sore to touch. She has gained a few pounds in one year.  Current Medications: Current Outpatient Prescriptions  Medication Sig Dispense Refill  . acetaminophen (TYLENOL) 325 MG tablet Take 650 mg by mouth every 4 (four) hours as needed. Two tablets by mouth every 4 hours as needed       . aspirin (ASPIRIN LOW DOSE) 81 MG EC tablet Take 81 mg by mouth daily. One tablet by mouth once daily       . baclofen (LIORESAL) 10 MG tablet Take 10 mg by mouth every 6 (six) hours. 2 tablets by mouth every 6 hours       . Cholecalciferol (VITAMIN D3) 3000 UNITS TABS Take 1 tablet by mouth daily.        . diazepam (VALIUM) 10 MG tablet Take 10 mg by mouth 4 (four) times daily. One tablet by mouth 4 times daily       . docusate sodium (COLACE) 100 MG capsule Take 100 mg by mouth 2 (two) times daily. One caplet by mouth  Two times a day       . DULoxetine (CYMBALTA) 30 MG capsule Take 30 mg by mouth daily.      . fish oil-omega-3 fatty acids 1000  MG capsule Take by mouth daily. One cap by mouth daily         . gabapentin (NEURONTIN) 800 MG tablet Take 400 mg by mouth every 6 (six) hours. One tablet by mouth every 6 hours      . hydrochlorothiazide (HYDRODIURIL) 25 MG tablet Take 1 tablet (25 mg total) by mouth daily.  90 tablet  3  . HYDROmorphone (DILAUDID) 2 MG tablet Take 2 mg by mouth every 12 (twelve) hours as needed.      Marland Kitchen ibuprofen (ADVIL,MOTRIN) 400 MG tablet Take 400 mg by mouth every 4 (four) hours as needed. Take one tablet by mouth q4 hours as needed       . interferon beta-1b (BETASERON) 0.3 MG injection Inject 0.25 mg into the skin every other day. One injection subcutaneously every other day       . losartan (COZAAR) 50 MG tablet Take 50 mg by mouth daily.      Marland Kitchen METHOCARBAMOL PO Take 500 mg by mouth 3 (three) times daily. One and half tabs by mouth three times a day       . mometasone (ELOCON) 0.1 % cream Apply 1 application topically daily.       . multivitamin (THERAGRAN) per tablet Take  1 tablet by mouth daily. One tablet by mouth once daily       . pantoprazole (PROTONIX) 40 MG tablet Take 40 mg by mouth 2 (two) times daily.        . polyethylene glycol (MIRALAX / GLYCOLAX) packet Take 17 g by mouth daily as needed. constipation  14 each  3  . potassium chloride (KLOR-CON 10) 10 MEQ tablet Take 1 tablet (10 mEq total) by mouth 2 (two) times daily.  60 tablet  3  . pregabalin (LYRICA) 100 MG capsule Take 100 mg by mouth 3 (three) times daily. Patient states that she takes 1 in the morning and 2 at bedtime      . traMADol (ULTRAM) 50 MG tablet Take 50 mg by mouth 2 (two) times daily.      . vitamin E 1000 UNIT capsule Take 1,000 Units by mouth daily.      . betamethasone valerate ointment (VALISONE) 0.1 % Apply topically 2 (two) times daily. Apply topically 2 (two) times daily.  45 g  3  . fluticasone (FLONASE) 50 MCG/ACT nasal spray Place 1 spray into the nose daily.  16 g  2  . meclizine (ANTIVERT) 12.5 MG tablet Take  1 tablet (12.5 mg total) by mouth 3 (three) times daily as needed for dizziness or nausea.  30 tablet  1   Current Facility-Administered Medications  Medication Dose Route Frequency Provider Last Rate Last Dose  . Influenza (>/= 3 years) inactive virus vaccine (FLVIRIN/FLUZONE) injection SUSP 0.5 mL  0.5 mL Intramuscular Once Salley Scarlet, MD         Objective: Blood pressure 120/80, pulse 76, temperature 97.5 F (36.4 C), temperature source Oral, resp. rate 18, height 5\' 9"  (1.753 m), weight 238 lb 4.8 oz (108.092 kg). Patient is alert and appears to be in no acute distress Conjunctiva is pink. Sclera is nonicteric Oropharyngeal mucosa is normal. No neck masses or thyromegaly noted. Cardiac exam with regular rhythm normal S1 and S2. No murmur or gallop noted. Lungs are clear to auscultation. Abdomen is symmetrical. Abdomen is soft with mild tenderness at left mid abdomen and epigastric region both on superficial and deep palpation. No organomegaly or masses. No LE edema or clubbing noted.    Assessment:  #1. GERD complicated by short segment Barrett's esophagus. Last EGD was in September 2012. She is having frequent breakthrough symptoms. Pantoprazole is not working anymore. #2. Abdominal pain appears to be musculoskeletal. Will consider further evaluation if it gets worse. #3. Constipation. She is doing well with dietary measures and stool softener. Last colonoscopy was in December 2010.   Plan:  Discontinue pantoprazole. Next lansoprazole 60 mg by mouth every morning. She will call with progress report in 4 weeks and return for office visit in 6 months.

## 2012-04-18 ENCOUNTER — Telehealth (INDEPENDENT_AMBULATORY_CARE_PROVIDER_SITE_OTHER): Payer: Self-pay | Admitting: *Deleted

## 2012-04-18 NOTE — Telephone Encounter (Signed)
LM stating she calling about 2 weeks ago to let Dr. Karilyn Cota know her new stomach pills are not working. She is having to take her old medication along with new medicine for acid reflux. Kally is not getting any relief. Please call something else in.

## 2012-04-21 NOTE — Telephone Encounter (Signed)
I talked with Natalie Campos. She states that the Dexilant is not working, and that there are nights that she has to take Pantoprazole with it. The reflux is much worse at night. She is requesting that another PPI be called in for her to try.

## 2012-05-01 NOTE — Telephone Encounter (Signed)
Please call in prescription for is a grade 40 mg by mouth twice a day 60 with 2 refills. And let patient know of change

## 2012-05-02 NOTE — Telephone Encounter (Signed)
Patient has been on Pantoprazole so Dr.Rehman has changed it to Nexium 40 mg Take 1 by mouth daily #30 with 5 refills. This was called to [place]/Nathan. Patient aware

## 2012-06-21 ENCOUNTER — Ambulatory Visit (HOSPITAL_COMMUNITY): Payer: Medicare Other

## 2012-06-22 ENCOUNTER — Ambulatory Visit (HOSPITAL_COMMUNITY): Payer: Medicare Other

## 2012-06-23 ENCOUNTER — Ambulatory Visit (HOSPITAL_COMMUNITY): Payer: Medicare Other

## 2012-06-29 ENCOUNTER — Telehealth (HOSPITAL_COMMUNITY): Payer: Self-pay | Admitting: *Deleted

## 2012-06-29 ENCOUNTER — Encounter (HOSPITAL_COMMUNITY): Payer: Medicare Other | Attending: Neurology

## 2012-06-29 VITALS — BP 112/76 | HR 84 | Temp 97.3°F | Resp 16

## 2012-06-29 DIAGNOSIS — G35 Multiple sclerosis: Secondary | ICD-10-CM

## 2012-06-29 LAB — COMPREHENSIVE METABOLIC PANEL WITH GFR
ALT: 24 U/L (ref 0–35)
AST: 25 U/L (ref 0–37)
Albumin: 3.8 g/dL (ref 3.5–5.2)
Alkaline Phosphatase: 113 U/L (ref 39–117)
BUN: 17 mg/dL (ref 6–23)
CO2: 25 meq/L (ref 19–32)
Calcium: 9.4 mg/dL (ref 8.4–10.5)
Chloride: 100 meq/L (ref 96–112)
Creatinine, Ser: 0.8 mg/dL (ref 0.50–1.10)
GFR calc Af Amer: >90 mL/min
GFR calc non Af Amer: 83 mL/min — ABNORMAL LOW
Glucose, Bld: 126 mg/dL — ABNORMAL HIGH (ref 70–99)
Potassium: 2.7 meq/L — CL (ref 3.5–5.1)
Sodium: 139 meq/L (ref 135–145)
Total Bilirubin: 0.2 mg/dL — ABNORMAL LOW (ref 0.3–1.2)
Total Protein: 8.1 g/dL (ref 6.0–8.3)

## 2012-06-29 LAB — CBC WITH DIFFERENTIAL/PLATELET
Basophils Absolute: 0.1 K/uL (ref 0.0–0.1)
Basophils Relative: 1 % (ref 0–1)
Eosinophils Absolute: 0.4 K/uL (ref 0.0–0.7)
Eosinophils Relative: 5 % (ref 0–5)
HCT: 37.2 % (ref 36.0–46.0)
Hemoglobin: 12.6 g/dL (ref 12.0–15.0)
Lymphocytes Relative: 25 % (ref 12–46)
Lymphs Abs: 1.8 K/uL (ref 0.7–4.0)
MCH: 29.4 pg (ref 26.0–34.0)
MCHC: 33.9 g/dL (ref 30.0–36.0)
MCV: 86.9 fL (ref 78.0–100.0)
Monocytes Absolute: 0.4 K/uL (ref 0.1–1.0)
Monocytes Relative: 6 % (ref 3–12)
Neutro Abs: 4.4 K/uL (ref 1.7–7.7)
Neutrophils Relative %: 64 % (ref 43–77)
Platelets: 289 K/uL (ref 150–400)
RBC: 4.28 MIL/uL (ref 3.87–5.11)
RDW: 14.4 % (ref 11.5–15.5)
WBC: 7 K/uL (ref 4.0–10.5)

## 2012-06-29 MED ORDER — SODIUM CHLORIDE 0.9 % IJ SOLN: 10.0000 mL | INTRAMUSCULAR | Status: DC | PRN

## 2012-06-29 MED ORDER — SODIUM CHLORIDE 0.9 % IV SOLN
1000.0000 mg | Freq: Once | INTRAVENOUS | Status: AC
  Administered 2012-06-29: 1000 mg via INTRAVENOUS
  Filled 2012-06-29: qty 8

## 2012-06-29 MED ORDER — SODIUM CHLORIDE 0.9 % IV SOLN
INTRAVENOUS | Status: DC
  Administered 2012-06-29: 13:00:00 via INTRAVENOUS

## 2012-06-29 NOTE — Progress Notes (Signed)
Infusion complete, patient tolerated well.  IV wrapped with guaze and taped.

## 2012-06-29 NOTE — Telephone Encounter (Signed)
.  CRITICAL VALUE ALERT Critical value received:  Potassium  Date of notification:  06/29/2012 Time of notification: 1335 Critical value read back:  yes Nurse who received alert:  Loma Newton RN MD notified (1st page):  Faxed and called to Dr. Gerilyn Pilgrim

## 2012-06-30 ENCOUNTER — Encounter (HOSPITAL_COMMUNITY): Payer: Medicare Other

## 2012-06-30 VITALS — BP 116/81 | HR 97 | Temp 97.8°F | Resp 20

## 2012-06-30 DIAGNOSIS — G35 Multiple sclerosis: Secondary | ICD-10-CM

## 2012-06-30 MED ORDER — SODIUM CHLORIDE 0.9 % IJ SOLN
10.0000 mL | INTRAMUSCULAR | Status: DC | PRN
  Administered 2012-06-30: 10 mL via INTRAVENOUS

## 2012-06-30 MED ORDER — SODIUM CHLORIDE 0.9 % IV SOLN: 1000.0000 mg | Freq: Once | INTRAVENOUS | Status: DC

## 2012-06-30 MED ORDER — SODIUM CHLORIDE 0.9 % IV SOLN
1000.0000 mg | Freq: Once | INTRAVENOUS | Status: DC
  Administered 2012-06-30: 1000 mg via INTRAVENOUS
  Filled 2012-06-30: qty 8

## 2012-06-30 MED ORDER — SODIUM CHLORIDE 0.9 % IV SOLN
INTRAVENOUS | Status: DC
  Administered 2012-06-30: 11:00:00 via INTRAVENOUS

## 2012-06-30 MED ORDER — METHYLPREDNISOLONE SODIUM SUCC 1000 MG IJ SOLR
Freq: Once | INTRAMUSCULAR | Status: DC
  Filled 2012-06-30: qty 8

## 2012-06-30 NOTE — Progress Notes (Signed)
Tolerated well

## 2012-07-01 ENCOUNTER — Encounter (HOSPITAL_COMMUNITY): Payer: Medicare Other

## 2012-07-01 VITALS — BP 122/78 | HR 99 | Temp 97.9°F | Resp 18

## 2012-07-01 DIAGNOSIS — G35 Multiple sclerosis: Secondary | ICD-10-CM

## 2012-07-01 MED ORDER — SODIUM CHLORIDE 0.9 % IV SOLN
INTRAVENOUS | Status: DC
  Administered 2012-07-01: 11:00:00 via INTRAVENOUS

## 2012-07-01 MED ORDER — SODIUM CHLORIDE 0.9 % IJ SOLN
10.0000 mL | INTRAMUSCULAR | Status: DC | PRN
  Administered 2012-07-01: 10 mL via INTRAVENOUS

## 2012-07-01 MED ORDER — SODIUM CHLORIDE 0.9 % IV SOLN
1000.0000 mg | Freq: Once | INTRAVENOUS | Status: AC
  Administered 2012-07-01: 1000 mg via INTRAVENOUS
  Filled 2012-07-01: qty 8

## 2012-07-01 NOTE — Progress Notes (Signed)
Infusion complete, patient tolerated well.  IV removed as this was patient's last infusion.   

## 2012-07-13 ENCOUNTER — Other Ambulatory Visit: Payer: Self-pay | Admitting: Neurology

## 2012-07-13 DIAGNOSIS — G35 Multiple sclerosis: Secondary | ICD-10-CM

## 2012-07-16 ENCOUNTER — Encounter: Payer: Self-pay | Admitting: *Deleted

## 2012-07-20 ENCOUNTER — Ambulatory Visit (HOSPITAL_COMMUNITY)
Admission: RE | Admit: 2012-07-20 | Discharge: 2012-07-20 | Disposition: A | Discharge status: Home / Self Care | Payer: Medicare Other | Source: Ambulatory Visit | Attending: Neurology | Admitting: Neurology

## 2012-07-20 DIAGNOSIS — G35 Multiple sclerosis: Secondary | ICD-10-CM | POA: Insufficient documentation

## 2012-07-20 MED ORDER — GADOBENATE DIMEGLUMINE 529 MG/ML IV SOLN
20.0000 mL | Freq: Once | INTRAVENOUS | Status: AC | PRN
  Administered 2012-07-20: 20 mL via INTRAVENOUS

## 2012-09-06 ENCOUNTER — Encounter (INDEPENDENT_AMBULATORY_CARE_PROVIDER_SITE_OTHER): Payer: Self-pay | Admitting: Internal Medicine

## 2012-09-06 ENCOUNTER — Ambulatory Visit (INDEPENDENT_AMBULATORY_CARE_PROVIDER_SITE_OTHER): Payer: Medicare Other | Admitting: Internal Medicine

## 2012-09-06 VITALS — BP 130/82 | HR 76 | Temp 97.3°F | Resp 18 | Ht 69.0 in | Wt 244.7 lb

## 2012-09-06 DIAGNOSIS — K219 Gastro-esophageal reflux disease without esophagitis: Secondary | ICD-10-CM

## 2012-09-06 MED ORDER — OMEPRAZOLE-SODIUM BICARBONATE 40-1100 MG PO CAPS: 1.0000 | ORAL_CAPSULE | Freq: Every day | ORAL | Status: DC

## 2012-09-06 NOTE — Patient Instructions (Signed)
Discontinue Nexium as Esomeprazole Begin Zegrid 40 mg by mouth 30 minutes before breakfast and at bedtime daily.
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
## 2012-09-27 ENCOUNTER — Telehealth (INDEPENDENT_AMBULATORY_CARE_PROVIDER_SITE_OTHER): Payer: Self-pay | Admitting: *Deleted

## 2012-09-27 NOTE — Telephone Encounter (Signed)
Natalie Campos was told by her pharmacy that [facility] will not pay for Zegerid and it works really good. She is needing this replaced with something else. If Tammy could please return her call at [phone].

## 2012-09-28 NOTE — Telephone Encounter (Signed)
Dr.Rehman will be made aware of this when he comes in the office this afternoon.

## 2012-09-29 NOTE — Telephone Encounter (Signed)
I talked with Natalie Campos. She states that Medicare will not pay for the Zegerid, she states that this has been helping her. She also has called the Pharmacy about the Motillim , she will need a prescription for this medication before they can assist her further. Natalie Campos states that she is going to order 100 to see how it helps before getting more. Samples of Zegerid was given per Delrae Rend Forwarded to Dr.Rehman to write the prescription. Patient will be called when it is ready

## 2012-09-29 NOTE — Telephone Encounter (Signed)
Patient called back to check on message. She only has 2 days left.

## 2012-09-29 NOTE — Telephone Encounter (Signed)
To Dr.Rehman 

## 2012-10-03 NOTE — Telephone Encounter (Signed)
Cardiology recommendations noted regarding Coumadin

## 2012-10-04 NOTE — Telephone Encounter (Signed)
Dr Karilyn Cota please review this again. I don't see where patient has recommendation for Coumadin -- this message appears to be for Zegerid -- thanks

## 2012-10-04 NOTE — Telephone Encounter (Signed)
That note was meant for Mr. [employer] and not Ms. Victoriano Lain

## 2012-10-17 ENCOUNTER — Ambulatory Visit (INDEPENDENT_AMBULATORY_CARE_PROVIDER_SITE_OTHER): Payer: Medicare Other | Admitting: Internal Medicine

## 2012-10-21 ENCOUNTER — Telehealth (INDEPENDENT_AMBULATORY_CARE_PROVIDER_SITE_OTHER): Payer: Self-pay | Admitting: *Deleted

## 2012-10-21 DIAGNOSIS — R748 Abnormal levels of other serum enzymes: Secondary | ICD-10-CM

## 2012-10-21 DIAGNOSIS — K589 Irritable bowel syndrome without diarrhea: Secondary | ICD-10-CM

## 2012-10-21 NOTE — Telephone Encounter (Signed)
.  Per Delrae Rend the patient will need to have lab work in 6 months

## 2012-10-21 NOTE — Telephone Encounter (Signed)
The C-Met was ordered on the wrong patient. Test has been Canceled.

## 2012-10-25 NOTE — Telephone Encounter (Signed)
The patient is waiting to send Motillium prescription off. I advised her that I had talked with the pharmacy and they felt that if the prescriptions were written separate that the insurance may cover it , this is for the Zergerid/BiCarb, Harrold Donath at [facility] gave me another telephone number to call. [phone]. Patient was advised that I would call and see what additional info I could find out.

## 2012-11-01 ENCOUNTER — Ambulatory Visit (INDEPENDENT_AMBULATORY_CARE_PROVIDER_SITE_OTHER): Payer: Medicare Other | Admitting: Internal Medicine

## 2012-11-03 ENCOUNTER — Telehealth (INDEPENDENT_AMBULATORY_CARE_PROVIDER_SITE_OTHER): Payer: Self-pay | Admitting: *Deleted

## 2012-11-03 NOTE — Telephone Encounter (Signed)
Find out what medication she is talking about

## 2012-11-03 NOTE — Telephone Encounter (Signed)
Asked Terri if she could please call patient and see what she needed. Terri asked that I transfer the message back to her and did.

## 2012-11-03 NOTE — Telephone Encounter (Signed)
Needs to speak with someone regards a Rx. The return phone number is [phone].

## 2012-11-07 NOTE — Telephone Encounter (Signed)
States she was unable to get the domperidone from [place]. Would not take her insurance. She c/o being constipated.  I am going to try linzess and see how she does.  4 boxes of Linzess will be given to patient. If this helps, will write an Rx.

## 2012-11-09 ENCOUNTER — Telehealth (INDEPENDENT_AMBULATORY_CARE_PROVIDER_SITE_OTHER): Payer: Self-pay | Admitting: *Deleted

## 2012-11-09 NOTE — Telephone Encounter (Signed)
Patient was called and made aware that samples were ready to be picked up. 

## 2012-11-09 NOTE — Telephone Encounter (Signed)
She needs samples of Zegerid. Only has 10 days left. Her return phone number is [phone].

## 2012-11-10 ENCOUNTER — Other Ambulatory Visit: Payer: Self-pay | Admitting: Family Medicine

## 2012-11-24 ENCOUNTER — Ambulatory Visit (INDEPENDENT_AMBULATORY_CARE_PROVIDER_SITE_OTHER): Payer: Medicare Other | Admitting: Internal Medicine

## 2012-12-12 ENCOUNTER — Ambulatory Visit (INDEPENDENT_AMBULATORY_CARE_PROVIDER_SITE_OTHER): Payer: Medicare Other | Admitting: Internal Medicine

## 2012-12-13 ENCOUNTER — Ambulatory Visit (INDEPENDENT_AMBULATORY_CARE_PROVIDER_SITE_OTHER): Payer: Medicare Other | Admitting: Internal Medicine

## 2012-12-16 ENCOUNTER — Other Ambulatory Visit: Payer: Self-pay | Admitting: Family Medicine

## 2013-01-23 ENCOUNTER — Ambulatory Visit (INDEPENDENT_AMBULATORY_CARE_PROVIDER_SITE_OTHER): Payer: Medicare Other | Admitting: Internal Medicine

## 2013-01-23 ENCOUNTER — Encounter (INDEPENDENT_AMBULATORY_CARE_PROVIDER_SITE_OTHER): Payer: Self-pay | Admitting: Internal Medicine

## 2013-01-23 VITALS — BP 124/90 | HR 74 | Temp 98.2°F | Resp 18 | Ht 69.0 in | Wt 233.9 lb

## 2013-01-23 DIAGNOSIS — K219 Gastro-esophageal reflux disease without esophagitis: Secondary | ICD-10-CM

## 2013-01-23 DIAGNOSIS — K589 Irritable bowel syndrome without diarrhea: Secondary | ICD-10-CM

## 2013-01-23 NOTE — Progress Notes (Signed)
Presenting complaint;  Follow for GERD and constipation.  Subjective:  Patient is 52 year old Caucasian female with multiple medical problems including MS who presents for scheduled visit. She was last seen on 09/06/2012. [place] She is presently taking 10 mg twice daily. She has noted decrease in regurgitation and heartburn but she still having coughing spells particularly when she is supine at night. She also complains of need to clear her throat frequently. She continues to complain of mid and lower abdominal pain as well as constipation. She was given samples of Linaclotide but she decided not to take this medication. She is not having any side effects or domperidone. She has lost 10 pounds since her last visit her office. She states she was begun on Lyrica by Dr. Pershing Proud by Dr. Gerilyn Pilgrim. It helped with her neuropathic pain a great deal but she gained 29 pounds and blood pressure was high. This medication was therefore discontinued.  Current Medications: Current Outpatient Prescriptions  Medication Sig Dispense Refill  . acetaminophen (TYLENOL) 325 MG tablet Take 650 mg by mouth every 4 (four) hours as needed. Two tablets by mouth every 4 hours as needed       . aspirin (ASPIRIN LOW DOSE) 81 MG EC tablet Take 81 mg by mouth 2 (two) times daily. One tablet by mouth once daily      . baclofen (LIORESAL) 10 MG tablet Take 10 mg by mouth every 6 (six) hours. 2 tablets by mouth every 6 hours       . Cholecalciferol (VITAMIN D3) 3000 UNITS TABS Take 1 tablet by mouth daily.        . diazepam (VALIUM) 10 MG tablet Take 10 mg by mouth 4 (four) times daily. One tablet by mouth 4 times daily       . docusate sodium (COLACE) 100 MG capsule Take 100 mg by mouth 2 (two) times daily. One caplet by mouth  Two times a day       . DULoxetine (CYMBALTA) 30 MG capsule Take 60 mg by mouth daily.       . fish oil-omega-3 fatty acids 1000 MG capsule Take by mouth  daily. One cap by mouth daily         . gabapentin (NEURONTIN) 800 MG tablet Take 600 mg by mouth 4 (four) times daily.       . hydrochlorothiazide (HYDRODIURIL) 25 MG tablet Take 1 tablet (25 mg total) by mouth daily.  90 tablet  3  . HYDROmorphone (DILAUDID) 2 MG tablet Take 2 mg by mouth every 12 (twelve) hours as needed.      Marland Kitchen losartan (COZAAR) 50 MG tablet TAKE 1 TABLET ONCE DAILY. STOP BENAZAPRIL.  30 tablet  3  . modafinil (PROVIGIL) 200 MG tablet Take 200 mg by mouth daily.       . mometasone (ELOCON) 0.1 % cream Apply 1 application topically daily.       . multivitamin (THERAGRAN) per tablet Take 1 tablet by mouth daily. One tablet by mouth once daily       . omeprazole-sodium bicarbonate (ZEGERID) 40-1100 MG per capsule Take 1 capsule by mouth daily before breakfast.  60 capsule  1  . PATANOL 0.1 % ophthalmic solution Place 1 drop into both eyes as needed.       . potassium chloride (KLOR-CON 10) 10 MEQ tablet Take 1 tablet (10 mEq total) by mouth 2 (two) times daily.  60 tablet  3  .  TECFIDERA 240 MG CPDR 240 mg 2 (two) times daily.       Marland Kitchen tiZANidine (ZANAFLEX) 4 MG tablet Take 2 mg by mouth every 8 (eight) hours as needed.       . topiramate (TOPAMAX) 50 MG tablet Take 50 mg by mouth 2 (two) times daily.      . traMADol (ULTRAM) 50 MG tablet Take 50 mg by mouth 2 (two) times daily.      . vitamin E 1000 UNIT capsule Take 1,000 Units by mouth daily.      . polyethylene glycol powder (MIRALAX) powder Take 17 g by mouth daily.  527 g  11   Current Facility-Administered Medications  Medication Dose Route Frequency Provider Last Rate Last Dose  . Influenza (>/= 3 years) inactive virus vaccine (FLVIRIN/FLUZONE) injection SUSP 0.5 mL  0.5 mL Intramuscular Once Salley Scarlet, MD         Objective: Blood pressure 124/90, pulse 74, temperature 98.2 F (36.8 C), temperature source Oral, resp. rate 18, height 5\' 9"  (1.753 m), weight 233 lb 14.4 oz (106.096 kg). Patient is alert and  in no acute distress. Conjunctiva is pink. Sclera is nonicteric Oropharyngeal mucosa is normal. No neck masses or thyromegaly noted. Cardiac exam with regular rhythm normal S1 and S2. No murmur or gallop noted. Lungs are clear to auscultation. Abdomen is full. Bowel sounds are normal. On palpation abdomen is soft with mild tenderness in midepigastrium and across lower abdomen. No organomegaly or masses.  No LE edema or clubbing noted.    Assessment:  #1. Refractory GERD. Her symptom control is poor since she is suspected to have underlying gastroparesis secondary to her medications. She appears to be doing better with addition of domperidone however symptom control is suboptimal. #2. Chronic constipation. She possibly has constipation predominant IBS.    Plan:  Patient given samples of Zegerid 40 mg by mouth every morning. Increased domperidone 10 mg before breakfast and evening meal and also at bedtime. She will continue stool Soffer and polyethylene glycol daily. Office visit in 4 months.

## 2013-01-23 NOTE — Patient Instructions (Addendum)
Take domperidone 10 mg before breakfast 10 mg before evening meal 10 mg at bedtime. Call if you have any side effects. Goal is for you to have at least 3 bowel movements each week.

## 2013-01-24 ENCOUNTER — Telehealth (INDEPENDENT_AMBULATORY_CARE_PROVIDER_SITE_OTHER): Payer: Self-pay | Admitting: *Deleted

## 2013-01-24 NOTE — Telephone Encounter (Signed)
Dr.Rehman was made aware. 

## 2013-01-24 NOTE — Telephone Encounter (Signed)
Please let Dr. Karilyn Cota know the Domperidon cost her $33 for 500 10mg  tablets and it was ordered through the Internet. Dr. Karilyn Cota had requsted this information and her return phone number is [phone] if he is needing any further questions.

## 2013-03-27 ENCOUNTER — Other Ambulatory Visit (INDEPENDENT_AMBULATORY_CARE_PROVIDER_SITE_OTHER): Payer: Self-pay | Admitting: Internal Medicine

## 2013-03-27 DIAGNOSIS — K5900 Constipation, unspecified: Secondary | ICD-10-CM

## 2013-03-27 MED ORDER — POLYETHYLENE GLYCOL 3350 17 GM/SCOOP PO POWD: 17.0000 g | Freq: Every day | ORAL | Status: DC

## 2013-04-11 ENCOUNTER — Telehealth (INDEPENDENT_AMBULATORY_CARE_PROVIDER_SITE_OTHER): Payer: Self-pay | Admitting: *Deleted

## 2013-04-11 NOTE — Telephone Encounter (Signed)
Need samples of Zegerid. Her return phone number is  [phone].

## 2013-04-11 NOTE — Telephone Encounter (Signed)
Patient notified we have samples here for pickup. Voice understood.

## 2013-04-11 NOTE — Telephone Encounter (Signed)
Patient was contacted that we had 2 bottles of samples.

## 2013-04-17 ENCOUNTER — Telehealth (INDEPENDENT_AMBULATORY_CARE_PROVIDER_SITE_OTHER): Payer: Self-pay | Admitting: *Deleted

## 2013-04-17 NOTE — Telephone Encounter (Signed)
Need samples of Zegerid. Only got 10 days worth last time. Her return phone number is [phone].

## 2013-04-17 NOTE — Telephone Encounter (Signed)
We do not have anymore samples at this time. Patient will be called and made aware12/09/14.

## 2013-04-18 NOTE — Telephone Encounter (Signed)
Patient called and made aware.

## 2013-05-22 ENCOUNTER — Other Ambulatory Visit: Payer: Self-pay | Admitting: Family Medicine

## 2013-05-29 ENCOUNTER — Encounter (INDEPENDENT_AMBULATORY_CARE_PROVIDER_SITE_OTHER): Payer: Self-pay | Admitting: Internal Medicine

## 2013-05-29 ENCOUNTER — Ambulatory Visit (INDEPENDENT_AMBULATORY_CARE_PROVIDER_SITE_OTHER): Payer: Medicare Other | Admitting: Internal Medicine

## 2013-05-29 VITALS — BP 128/78 | HR 88 | Temp 98.4°F | Ht 70.0 in | Wt 241.0 lb

## 2013-05-29 DIAGNOSIS — K219 Gastro-esophageal reflux disease without esophagitis: Secondary | ICD-10-CM

## 2013-05-29 NOTE — Patient Instructions (Signed)
OV in 6 months. Continue present medications 

## 2013-05-29 NOTE — Progress Notes (Signed)
Subjective:     Patient ID: Natalie Campos, female   DOB: [birth year], 53 y.o.   MRN: [id number]  HPI  Presents today for f/u. She was last seen in September/2014.  She is taking domperidone for a slow emptying stomach. She is taking once a day.  She takes stools softener x 3 a day and has constipation. Sometimes she will go 5 days without a BM. She was given samples of Linzess but was unable to afford it.  Appetite is not good. No weight loss.  She has actually gained 7 pounds since her last visit. Her acid reflux is controlled with Zegrid on a prn.   10/6438 EGD: Complications: None  Impression:  3 small patches of salmon-colored mucosa consistent with short segment Barretts esophagus. Biopsy taken from these areas and submitted in one container. These patches patches; therefore difficult to take target biopsy.  Moderate size sliding hiatal hernia.  The ulcers and 2 erosions at the level of diaphragmatic hiatus.  Nonerosive antral gastritis.  Will consider next EGD in 3 years unless has progressive symptoms. Office visit in one year.     EGD/Colonoscopy: 2010 Mild changes of reflux esophagitis limited to GE junction. Serrated or wavy gastroesophageal junction which was biopsied for histology to rule out short segment Barrett's. Small sliding hiatal hernia. Scar noted at the level of diaphragmatic hiatus but no active ulceration noted. Biopsy: Short segment Barrett's esophagus with low grade dysplasia. Normal colonoscopy and terminal ileoscopy.  Review of Systems  [facility] Current Outpatient Prescriptions  Medication Sig Dispense Refill  . acetaminophen (TYLENOL) 325 MG tablet Take 650 mg by mouth every 4 (four) hours as needed. Two tablets by mouth every 4 hours as needed       . aspirin (ASPIRIN LOW DOSE) 81 MG EC tablet Take 81 mg by mouth 2 (two) times daily. One tablet by mouth once daily      . baclofen (LIORESAL) 10 MG tablet Take 10 mg by mouth 3 (three) times daily. 2 tablets by  mouth every 6 hours      . Cholecalciferol (VITAMIN D3) 3000 UNITS TABS Take 1 tablet by mouth daily.        . diazepam (VALIUM) 10 MG tablet Take 10 mg by mouth 4 (four) times daily. One tablet by mouth 4 times daily      . docusate sodium (COLACE) 100 MG capsule Take 100 mg by mouth 2 (two) times daily. One caplet by mouth  Two times a day       . DULoxetine (CYMBALTA) 30 MG capsule Take 60 mg by mouth daily.       . fish oil-omega-3 fatty acids 1000 MG capsule Take by mouth daily. One cap by mouth daily         . gabapentin (NEURONTIN) 800 MG tablet Take 600 mg by mouth 4 (four) times daily.       . hydrochlorothiazide (HYDRODIURIL) 25 MG tablet Take 1 tablet (25 mg total) by mouth daily.  90 tablet  3  . losartan (COZAAR) 50 MG tablet Take 1 tablet (50 mg total) by mouth daily. NEEDS OFFICE VISIT.  30 tablet  0  . modafinil (PROVIGIL) 200 MG tablet Take 200 mg by mouth daily.       . mometasone (ELOCON) 0.1 % cream Apply 1 application topically daily.       . multivitamin (THERAGRAN) per tablet Take 1 tablet by mouth daily. One tablet by mouth once daily       .  omeprazole-sodium bicarbonate (ZEGERID) 40-1100 MG per capsule Take 1 capsule by mouth daily before breakfast.  60 capsule  1  . PATANOL 0.1 % ophthalmic solution Place 1 drop into both eyes as needed.       . potassium chloride (K-DUR) 10 MEQ tablet Take 10 mEq by mouth daily.      . TECFIDERA 240 MG CPDR 240 mg 2 (two) times daily.       Marland Kitchen tiZANidine (ZANAFLEX) 4 MG tablet Take 2 mg by mouth every 8 (eight) hours as needed.       . topiramate (TOPAMAX) 50 MG tablet Take 50 mg by mouth 2 (two) times daily.      . traMADol (ULTRAM) 50 MG tablet Take 50 mg by mouth 2 (two) times daily.      . vitamin E 1000 UNIT capsule Take 1,000 Units by mouth daily.      Marland Kitchen HYDROmorphone (DILAUDID) 2 MG tablet Take 2 mg by mouth every 12 (twelve) hours as needed.      . polyethylene glycol powder (GLYCOLAX/MIRALAX) powder MIX 17 GRAMS IN 8 OZ OF  LIQUID ONCE DAILY.  527 g  5  . polyethylene glycol powder (MIRALAX) powder Take 17 g by mouth daily.  527 g  4  . potassium chloride (KLOR-CON 10) 10 MEQ tablet Take 1 tablet (10 mEq total) by mouth 2 (two) times daily.  60 tablet  3   Current Facility-Administered Medications  Medication Dose Route Frequency Provider Last Rate Last Dose  . Influenza (>/= 3 years) inactive virus vaccine (FLVIRIN/FLUZONE) injection SUSP 0.5 mL  0.5 mL Intramuscular Once Alycia Rossetti, MD       Past Medical History  Diagnosis Date  . Emphysema   . Eczema   . Depression   . Hyperlipidemia   . GERD (gastroesophageal reflux disease)   . IBS (irritable bowel syndrome)     contipation predominant   . DJD (degenerative joint disease)   . Hypertension   . Multiple sclerosis 1990    Dx in 1990 most recent hospitalization for a flareis in 2011   . High triglycerides   . Obesity   . Dog bite(E906.0) july 29,2011   Past Surgical History  Procedure Laterality Date  . Cholecystectomy  2010    Dr. Anthony Sar   . Wisdom tooth extraction    . Esophagogastroduodenoscopy  01/28/2011    Procedure: ESOPHAGOGASTRODUODENOSCOPY (EGD);  Surgeon: Rogene Houston, MD;  Location: [facility];  Service: Endoscopy;  Laterality: N/A;  1:00  . Esophageal biopsy  01/28/2011    Procedure: BIOPSY;  Surgeon: Rogene Houston, MD;  Location: [facility];  Service: Endoscopy;  Laterality: N/A;   Allergies  Allergen Reactions  . Ace Inhibitors   . Penicillins           Objective:   Physical Exam  Filed Vitals:   05/29/13 1503  BP: 128/78  Pulse: 88  Temp: 98.4 F (36.9 C)  Height: 5\' 10"  (1.778 m)  Weight: 241 lb (109.317 kg)   Alert and oriented. Skin warm and dry. Oral mucosa is moist.   . Sclera anicteric, conjunctivae is pink. Thyroid not enlarged. No cervical lymphadenopathy. Lungs clear. Heart regular rate and rhythm.  Abdomen is soft. Bowel sounds are positive. No hepatomegaly. No abdominal masses felt. No  tenderness. Obese.  No edema to lower extremities. Patient is alert and oriented.     Assessment:     GERD controlled for the most part at  this time.  Hx of gastroparesis and presently taking Domperidone.    Plan:    Continue present medication. OV in 6 months. Samples of Zeegrid given to patient x 6 bottles.

## 2013-06-23 ENCOUNTER — Other Ambulatory Visit: Payer: Self-pay | Admitting: Family Medicine

## 2013-06-24 ENCOUNTER — Other Ambulatory Visit: Payer: Self-pay | Admitting: Family Medicine

## 2013-06-26 ENCOUNTER — Other Ambulatory Visit: Payer: Self-pay | Admitting: Family Medicine

## 2013-07-10 ENCOUNTER — Ambulatory Visit (INDEPENDENT_AMBULATORY_CARE_PROVIDER_SITE_OTHER): Payer: Medicare Other | Admitting: Family Medicine

## 2013-07-10 ENCOUNTER — Encounter: Payer: Self-pay | Admitting: Family Medicine

## 2013-07-10 VITALS — Temp 98.9°F | Ht 69.0 in | Wt 235.6 lb

## 2013-07-10 DIAGNOSIS — J019 Acute sinusitis, unspecified: Secondary | ICD-10-CM

## 2013-07-10 MED ORDER — SULFAMETHOXAZOLE-TMP DS 800-160 MG PO TABS
1.0000 | ORAL_TABLET | Freq: Two times a day (BID) | ORAL | Status: DC
Start: 2013-07-10 — End: 2013-08-01

## 2013-07-10 NOTE — Progress Notes (Signed)
   Subjective:    Patient ID: Natalie Campos, female    DOB: [birth year], 53 y.o.   MRN: [id number]  Cough This is a new problem. The current episode started in the past 7 days. Associated symptoms include wheezing.   Patient relates start off with head congestion drainage coughing progressive over the past week started off as he thought more like the flu denies any other particular troubles in   Review of Systems  Respiratory: Positive for cough and wheezing.    no vomiting or diarrhea     Objective:   Physical Exam  Lungs are clear hearts regular sinus mild tenderness eardrums normal patient not respiratory distress      Assessment & Plan:  #1 viral syndrome #2 secondary acute sinusitis antibiotics prescribed #3 she needs followup the near future for blood pressure issues

## 2013-08-01 ENCOUNTER — Ambulatory Visit (INDEPENDENT_AMBULATORY_CARE_PROVIDER_SITE_OTHER): Payer: Medicare Other | Admitting: Family Medicine

## 2013-08-01 ENCOUNTER — Encounter: Payer: Self-pay | Admitting: Family Medicine

## 2013-08-01 VITALS — BP 130/92 | Ht 69.0 in | Wt 235.5 lb

## 2013-08-01 DIAGNOSIS — R5381 Other malaise: Secondary | ICD-10-CM

## 2013-08-01 DIAGNOSIS — E876 Hypokalemia: Secondary | ICD-10-CM

## 2013-08-01 DIAGNOSIS — D649 Anemia, unspecified: Secondary | ICD-10-CM | POA: Insufficient documentation

## 2013-08-01 DIAGNOSIS — R5383 Other fatigue: Secondary | ICD-10-CM

## 2013-08-01 DIAGNOSIS — Z79899 Other long term (current) drug therapy: Secondary | ICD-10-CM

## 2013-08-01 DIAGNOSIS — E785 Hyperlipidemia, unspecified: Secondary | ICD-10-CM

## 2013-08-01 DIAGNOSIS — I10 Essential (primary) hypertension: Secondary | ICD-10-CM

## 2013-08-01 MED ORDER — OLOPATADINE HCL 0.1 % OP SOLN
1.0000 [drp] | OPHTHALMIC | Status: DC | PRN
Start: 2013-08-01 — End: 2014-07-06

## 2013-08-01 NOTE — Progress Notes (Signed)
   Subjective:    Patient ID: Natalie Campos, female    DOB: [birth year], 53 y.o.   MRN: [id number]  Hypertension This is a chronic problem. The current episode started more than 1 year ago. The problem has been gradually improving since onset. The problem is controlled. Pertinent negatives include no chest pain. There are no associated agents to hypertension. There are no known risk factors for coronary artery disease. Treatments tried: losartan. The current treatment provides significant improvement. There are no compliance problems.   Patient states he needs a refill on her patanol. Patient states that she is concerned about her weight gain. Would like to discuss options to help her with this.   25 minutes spent with patient going through her health problems plus also discussing weight gain associated with medications and MS in a discussing strategies to lose weight as well as exercise greater than half the amount of time was spent in discussion of these issues.  Review of Systems  Constitutional: Negative for activity change, appetite change and fatigue.  HENT: Negative for congestion and rhinorrhea.   Respiratory: Negative for cough and choking.   Cardiovascular: Negative for chest pain.  Gastrointestinal: Negative for abdominal pain.  Endocrine: Negative for polydipsia and polyphagia.  Genitourinary: Negative for frequency.  Musculoskeletal: Positive for arthralgias and back pain.  Neurological: Negative for weakness.  Psychiatric/Behavioral: Negative for confusion.       Objective:   Physical Exam  Vitals reviewed. Constitutional: She appears well-nourished. No distress.  Cardiovascular: Normal rate, regular rhythm and normal heart sounds.   No murmur heard. Pulmonary/Chest: Effort normal and breath sounds normal. No respiratory distress.  Musculoskeletal: She exhibits no edema.  Lymphadenopathy:    She has no cervical adenopathy.  Neurological: She is alert. She exhibits  normal muscle tone.  Psychiatric: Her behavior is normal.          Assessment & Plan:  #1 HTN slightly elevated she needs try to watch diet try to lose weight is difficult for her to do so been on her medicines she is on plus also lack of ability to exercise due to MS  #2 MS-under the care of neurology #3 chronic pain under the care of neurology #4 stress related issues she is taking medication currently seems to be helping #5 allergy issues Patanol refills given #6 weight gain-long discussion held regarding dietary measures exercise and try to lose weight #7 history hyperlipidemia check lipid profile #8 anemia -- hemoglobin looks low even though she's up-to-date on colonoscopy we need to do further workup on this. Patient's CBC was low when she checked it at the neurologist  #9 thyroid-borderline thyroid function check TSH may need medication supplement

## 2013-08-04 LAB — CBC WITH DIFFERENTIAL/PLATELET
Basophils Absolute: 0.1 10*3/uL (ref 0.0–0.1)
Basophils Relative: 2 % — ABNORMAL HIGH (ref 0–1)
EOS PCT: 6 % — AB (ref 0–5)
Eosinophils Absolute: 0.3 10*3/uL (ref 0.0–0.7)
HCT: 31.1 % — ABNORMAL LOW (ref 36.0–46.0)
HEMOGLOBIN: 9.7 g/dL — AB (ref 12.0–15.0)
LYMPHS PCT: 30 % (ref 12–46)
Lymphs Abs: 1.3 10*3/uL (ref 0.7–4.0)
MCH: 22.5 pg — ABNORMAL LOW (ref 26.0–34.0)
MCHC: 31.2 g/dL (ref 30.0–36.0)
MCV: 72 fL — AB (ref 78.0–100.0)
MONOS PCT: 6 % (ref 3–12)
Monocytes Absolute: 0.3 10*3/uL (ref 0.1–1.0)
NEUTROS ABS: 2.4 10*3/uL (ref 1.7–7.7)
Neutrophils Relative %: 56 % (ref 43–77)
Platelets: 323 10*3/uL (ref 150–400)
RBC: 4.32 MIL/uL (ref 3.87–5.11)
RDW: 17.5 % — ABNORMAL HIGH (ref 11.5–15.5)
WBC: 4.3 10*3/uL (ref 4.0–10.5)

## 2013-08-05 ENCOUNTER — Other Ambulatory Visit: Payer: Self-pay | Admitting: Family Medicine

## 2013-08-05 DIAGNOSIS — D509 Iron deficiency anemia, unspecified: Secondary | ICD-10-CM

## 2013-08-05 LAB — BASIC METABOLIC PANEL
BUN: 13 mg/dL (ref 6–23)
CHLORIDE: 105 meq/L (ref 96–112)
CO2: 27 mEq/L (ref 19–32)
CREATININE: 0.75 mg/dL (ref 0.50–1.10)
Calcium: 9.4 mg/dL (ref 8.4–10.5)
Glucose, Bld: 85 mg/dL (ref 70–99)
POTASSIUM: 4 meq/L (ref 3.5–5.3)
Sodium: 141 mEq/L (ref 135–145)

## 2013-08-05 LAB — LIPID PANEL
CHOLESTEROL: 182 mg/dL (ref 0–200)
HDL: 67 mg/dL (ref >39)
LDL Cholesterol: 99 mg/dL (ref 0–99)
TRIGLYCERIDES: 80 mg/dL (ref <150)
Total CHOL/HDL Ratio: 2.7 Ratio
VLDL: 16 mg/dL (ref 0–40)

## 2013-08-05 LAB — TSH: TSH: 2.029 u[IU]/mL (ref 0.350–4.500)

## 2013-08-05 LAB — IRON AND TIBC
%SAT: 4 % — ABNORMAL LOW (ref 20–55)
Iron: 15 ug/dL — ABNORMAL LOW (ref 42–145)
TIBC: 396 ug/dL (ref 250–470)
UIBC: 381 ug/dL (ref 125–400)

## 2013-08-05 LAB — FERRITIN: FERRITIN: 3 ng/mL — AB (ref 10–291)

## 2013-08-05 LAB — T4, FREE: Free T4: 0.99 ng/dL (ref 0.80–1.80)

## 2013-08-07 NOTE — Progress Notes (Signed)
Patient notified and verbalized understanding of the test results. No further questions. 

## 2013-08-11 ENCOUNTER — Other Ambulatory Visit: Payer: Self-pay | Admitting: *Deleted

## 2013-08-11 DIAGNOSIS — D649 Anemia, unspecified: Secondary | ICD-10-CM

## 2013-08-11 LAB — POC HEMOCCULT BLD/STL (HOME/3-CARD/SCREEN)
FECAL OCCULT BLD: NEGATIVE
FECAL OCCULT BLD: NEGATIVE
Fecal Occult Blood, POC: NEGATIVE

## 2013-08-17 ENCOUNTER — Other Ambulatory Visit (INDEPENDENT_AMBULATORY_CARE_PROVIDER_SITE_OTHER): Payer: Self-pay | Admitting: *Deleted

## 2013-08-17 ENCOUNTER — Encounter (INDEPENDENT_AMBULATORY_CARE_PROVIDER_SITE_OTHER): Payer: Self-pay | Admitting: Internal Medicine

## 2013-08-17 ENCOUNTER — Telehealth (INDEPENDENT_AMBULATORY_CARE_PROVIDER_SITE_OTHER): Payer: Self-pay | Admitting: *Deleted

## 2013-08-17 ENCOUNTER — Ambulatory Visit (INDEPENDENT_AMBULATORY_CARE_PROVIDER_SITE_OTHER): Payer: Medicare Other | Admitting: Internal Medicine

## 2013-08-17 VITALS — BP 122/82 | HR 82 | Temp 98.0°F | Ht 69.0 in | Wt 234.6 lb

## 2013-08-17 DIAGNOSIS — Z1211 Encounter for screening for malignant neoplasm of colon: Secondary | ICD-10-CM

## 2013-08-17 DIAGNOSIS — D509 Iron deficiency anemia, unspecified: Secondary | ICD-10-CM

## 2013-08-17 DIAGNOSIS — K2270 Barrett's esophagus without dysplasia: Secondary | ICD-10-CM

## 2013-08-17 DIAGNOSIS — D649 Anemia, unspecified: Secondary | ICD-10-CM

## 2013-08-17 DIAGNOSIS — R195 Other fecal abnormalities: Secondary | ICD-10-CM

## 2013-08-17 DIAGNOSIS — K279 Peptic ulcer, site unspecified, unspecified as acute or chronic, without hemorrhage or perforation: Secondary | ICD-10-CM

## 2013-08-17 MED ORDER — PEG-KCL-NACL-NASULF-NA ASC-C 100 G PO SOLR: 1.0000 | Freq: Once | ORAL | Status: DC

## 2013-08-17 NOTE — Telephone Encounter (Signed)
Patient needs movi prep 

## 2013-08-17 NOTE — Progress Notes (Signed)
Subjective:     Patient ID: Natalie Campos, female   DOB: Sep 09, 1960, 53 y.o.   MRN: [id number]  HPIReferred to our office anemia. Recent drop in her hemogloblin to 9.7 on 08/04/2013. Last Hemoglobin 06/19/2012 was 12.8. 08/04/2013 Ferritin 3, Iron 15, ) Sat 4, Tibc 396, UIBC 381. Hx of Barrett's esophagus. Last surveillance was in 2012. Biopsy: Short segment Barrett's esophagus with low grade dysplasia. Last colonoscopy in 2010 which was normal.   She denies prior hx of anemia.  There has been no rectal bleeding. Stools are dark brown in color. No melena. She usually has a BM daily and then she will have one 6 days later. Hx of IBS. On average she has BM once a week and takes stool softener and Miralax for her constipation. Appetite is good. She has lost 7 pounds since her last visit which was intentional. She says she has a stabbing pain in her left upper quadrant. Tender to the touch. Pain 2-3 weeks off and on. Acid reflux is controlled for the most part.  Hx of MS. Taking Techfidera.  She takes Motrin 2-3 times week, ASA 81mg  twice a day. Stool guaiac negative in Dr. Nicki Reaper Luking's office. Hx of H. Pylori in the pasat.   08/979 EGD:  Complications: None  Impression:  3 small patches of salmon-colored mucosa consistent with short segment Barretts esophagus. Biopsy taken from these areas and submitted in one container. These patches patches; therefore difficult to take target biopsy.  Moderate size sliding hiatal hernia.  The ulcers and 2 erosions at the level of diaphragmatic hiatus.  Nonerosive antral gastritis.  Biopsy Intestinal metaplasia. Consistent with Barrett's esophagus. No dysplasia or malignancy Will consider next EGD in 3 years unless has progressive symptoms. Office visit in one year.  EGD/Colonoscopy (screen): 2010  Mild changes of reflux esophagitis limited to GE junction. Serrated or wavy gastroesophageal junction which was biopsied for histology to rule out short segment  Barrett's. Small sliding hiatal hernia. Scar noted at the level of diaphragmatic hiatus but no active ulceration noted. Biopsy: Short segment Barrett's esophagus with low grade dysplasia.  Normal colonoscopy and terminal ileoscopy.      CBC    Component Value Date/Time   WBC 4.3 08/04/2013 1342   RBC 4.32 08/04/2013 1342   HGB 9.7* 08/04/2013 1342   HCT 31.1* 08/04/2013 1342   PLT 323 08/04/2013 1342   MCV 72.0* 08/04/2013 1342   MCH 22.5* 08/04/2013 1342   MCHC 31.2 08/04/2013 1342   RDW 17.5* 08/04/2013 1342   LYMPHSABS 1.3 08/04/2013 1342   MONOABS 0.3 08/04/2013 1342   EOSABS 0.3 08/04/2013 1342   BASOSABS 0.1 08/04/2013 1342       Review of Systems Past Medical History  Diagnosis Date  . Emphysema   . Eczema   . Depression   . Hyperlipidemia   . GERD (gastroesophageal reflux disease)   . IBS (irritable bowel syndrome)     contipation predominant   . DJD (degenerative joint disease)   . Hypertension   . Multiple sclerosis 1990    Dx in 1990 most recent hospitalization for a flareis in 2011   . High triglycerides   . Obesity   . Dog bite(E906.0) july 29,2011  . Barrett's esophagus     Past Surgical History  Procedure Laterality Date  . Cholecystectomy  2010    Dr. Anthony Sar   . Wisdom tooth extraction    . Esophagogastroduodenoscopy  01/28/2011    Procedure: ESOPHAGOGASTRODUODENOSCOPY (EGD);  Surgeon: Rogene Houston, MD;  Location: [facility];  Service: Endoscopy;  Laterality: N/A;  1:00  . Esophageal biopsy  01/28/2011    Procedure: BIOPSY;  Surgeon: Rogene Houston, MD;  Location: [facility];  Service: Endoscopy;  Laterality: N/A;    Allergies  Allergen Reactions  . Ace Inhibitors   . Penicillins     Current Outpatient Prescriptions on File Prior to Visit  Medication Sig Dispense Refill  . acetaminophen (TYLENOL) 325 MG tablet Take 650 mg by mouth every 4 (four) hours as needed. Two tablets by mouth every 4 hours as needed       . aspirin (ASPIRIN LOW  DOSE) 81 MG EC tablet Take 81 mg by mouth 2 (two) times daily. One tablet by mouth once daily      . baclofen (LIORESAL) 10 MG tablet Take 10 mg by mouth 3 (three) times daily. 2 tablets by mouth every 6 hours      . Cholecalciferol (VITAMIN D3) 3000 UNITS TABS Take 1 tablet by mouth daily.        . diazepam (VALIUM) 10 MG tablet Take 10 mg by mouth 4 (four) times daily. One tablet by mouth 4 times daily      . docusate sodium (COLACE) 100 MG capsule Take 100 mg by mouth 2 (two) times daily. One caplet by mouth  Two times a day       . DULoxetine (CYMBALTA) 30 MG capsule Take 60 mg by mouth daily.       . fish oil-omega-3 fatty acids 1000 MG capsule Take by mouth daily. One cap by mouth daily         . gabapentin (NEURONTIN) 800 MG tablet Take 600 mg by mouth 4 (four) times daily.       . hydrochlorothiazide (HYDRODIURIL) 25 MG tablet Take 1 tablet (25 mg total) by mouth daily.  90 tablet  3  . HYDROmorphone (DILAUDID) 2 MG tablet Take 2 mg by mouth every 12 (twelve) hours as needed.      Marland Kitchen losartan (COZAAR) 50 MG tablet Take 1 tablet (50 mg total) by mouth daily. NEEDS OFFICE VISIT.  30 tablet  0  . modafinil (PROVIGIL) 200 MG tablet Take 200 mg by mouth daily.       . mometasone (ELOCON) 0.1 % cream Apply 1 application topically daily.       . multivitamin (THERAGRAN) per tablet Take 1 tablet by mouth daily. One tablet by mouth once daily       . olopatadine (PATANOL) 0.1 % ophthalmic solution Place 1 drop into both eyes as needed.  5 mL  12  . omeprazole-sodium bicarbonate (ZEGERID) 40-1100 MG per capsule Take 1 capsule by mouth daily before breakfast.  60 capsule  1  . polyethylene glycol powder (GLYCOLAX/MIRALAX) powder MIX 17 GRAMS IN 8 OZ OF LIQUID ONCE DAILY.  527 g  5  . polyethylene glycol powder (MIRALAX) powder Take 17 g by mouth daily.  527 g  4  . potassium chloride (K-DUR) 10 MEQ tablet Take 10 mEq by mouth daily.      . TECFIDERA 240 MG CPDR 240 mg 2 (two) times daily.       Marland Kitchen  topiramate (TOPAMAX) 50 MG tablet Take 50 mg by mouth 2 (two) times daily.      . traMADol (ULTRAM) 50 MG tablet Take 50 mg by mouth 2 (two) times daily.      . vitamin E 1000 UNIT capsule  Take 1,000 Units by mouth daily.       Current Facility-Administered Medications on File Prior to Visit  Medication Dose Route Frequency Provider Last Rate Last Dose  . Influenza (>/= 3 years) inactive virus vaccine (FLVIRIN/FLUZONE) injection SUSP 0.5 mL  0.5 mL Intramuscular Once Alycia Rossetti, MD            Objective:   Physical Exam  Filed Vitals:   08/17/13 1413  BP: 122/82  Pulse: 82  Temp: 98 F (36.7 C)  Height: 5\' 9"  (1.753 m)  Weight: 234 lb 9.6 oz (106.414 kg)  Alert and oriented. Skin warm and dry. Oral mucosa is moist.   . Sclera anicteric, conjunctivae is pink. Thyroid not enlarged. No cervical lymphadenopathy. Lungs clear. Heart regular rate and rhythm.  Abdomen is soft. Bowel sounds are positive. No hepatomegaly. No abdominal masses felt. Tenderness left upper quadrant under ribs. .  No edema to lower extremities.  Stool very little, brown and guaiac positive.      Assessment:     Iron deficiency. PUD needs to be ruled out. Hx of Barrett's. Patient is on ASA 81mg  daily and also takes Motrin 2-3 times a week.    Plan:    EGD/Colonoscopy. I discussed with Dr. Laural Golden

## 2013-08-17 NOTE — Patient Instructions (Signed)
EGD with Dr. Rehman. 

## 2013-08-18 ENCOUNTER — Encounter (HOSPITAL_COMMUNITY): Payer: Self-pay | Admitting: Pharmacy Technician

## 2013-08-21 NOTE — Patient Instructions (Signed)
Natalie Campos  08/21/2013   Your procedure is scheduled on:  08/25/2013  Report to [facility] at  33  AM.  Call this number if you have problems the morning of surgery: [phone]   Remember:   Do not eat food or drink liquids after midnight.   Take these medicines the morning of surgery with A SIP OF WATER: baclofen, diazepam, cymbalta, neurontin, HCTZ, dilaudid, losartan, modafinil, topamax.   Do not wear jewelry, make-up or nail polish.  Do not wear lotions, powders, or perfumes.   Do not shave 48 hours prior to surgery. Men may shave face and neck.  Do not bring valuables to the hospital.  [facility] is not responsible for any belongings or valuables.               Contacts, dentures or bridgework may not be worn into surgery.  Leave suitcase in the car. After surgery it may be brought to your room.  For patients admitted to the hospital, discharge time is determined by your treatment team.               Patients discharged the day of surgery will not be allowed to drive home.  Name and phone number of your driver: family  Special Instructions: N/A   Please read over the following fact sheets that you were given: Pain Booklet, Coughing and Deep Breathing, Surgical Site Infection Prevention, Anesthesia Post-op Instructions and Care and Recovery After Surgery Colonoscopy A colonoscopy is an exam to look at the entire large intestine (colon). This exam can help find problems such as tumors, polyps, inflammation, and areas of bleeding. The exam takes about 1 hour.  LET [facility] CARE PROVIDER KNOW ABOUT:   Any allergies you have.  All medicines you are taking, including vitamins, herbs, eye drops, creams, and over-the-counter medicines.  Previous problems you or members of your family have had with the use of anesthetics.  Any blood disorders you have.  Previous surgeries you have had.  Medical conditions you have. RISKS AND COMPLICATIONS  Generally, this is  a safe procedure. However, as with any procedure, complications can occur. Possible complications include:  Bleeding.  Tearing or rupture of the colon wall.  Reaction to medicines given during the exam.  Infection (rare). BEFORE THE PROCEDURE   Ask your health care provider about changing or stopping your regular medicines.  You may be prescribed an oral bowel prep. This involves drinking a large amount of medicated liquid, starting the day before your procedure. The liquid will cause you to have multiple loose stools until your stool is almost clear or light green. This cleans out your colon in preparation for the procedure.  Do not eat or drink anything else once you have started the bowel prep, unless your health care provider tells you it is safe to do so.  Arrange for someone to drive you home after the procedure. PROCEDURE   You will be given medicine to help you relax (sedative).  You will lie on your side with your knees bent.  A long, flexible tube with a light and camera on the end (colonoscope) will be inserted through the rectum and into the colon. The camera sends video back to a computer screen as it moves through the colon. The colonoscope also releases carbon dioxide gas to inflate the colon. This helps your health care provider see the area better.  During the exam, your health care provider  may take a small tissue sample (biopsy) to be examined under a microscope if any abnormalities are found.  The exam is finished when the entire colon has been viewed. AFTER THE PROCEDURE   Do not drive for 24 hours after the exam.  You may have a small amount of blood in your stool.  You may pass moderate amounts of gas and have mild abdominal cramping or bloating. This is caused by the gas used to inflate your colon during the exam.  Ask when your test results will be ready and how you will get your results. Make sure you get your test results. Document Released: 04/24/2000  Document Revised: 02/15/2013 Document Reviewed: 01/02/2013 [facility] Patient Information 2014 [place]. Esophagogastroduodenoscopy Esophagogastroduodenoscopy (EGD) is a procedure to examine the lining of the esophagus, stomach, and first part of the small intestine (duodenum). A long, flexible, lighted tube with a camera attached (endoscope) is inserted down the throat to view these organs. This procedure is done to detect problems or abnormalities, such as inflammation, bleeding, ulcers, or growths, in order to treat them. The procedure lasts about 5 20 minutes. It is usually an outpatient procedure, but it may need to be performed in emergency cases in the hospital. LET YOUR CAREGIVER KNOW ABOUT:   Allergies to food or medicine.  All medicines you are taking, including vitamins, herbs, eyedrops, and over-the-counter medicines and creams.  Use of steroids (by mouth or creams).  Previous problems you or members of your family have had with the use of anesthetics.  Any blood disorders you have.  Previous surgeries you have had.  Other health problems you have.  Possibility of pregnancy, if this applies. RISKS AND COMPLICATIONS  Generally, EGD is a safe procedure. However, as with any procedure, complications can occur. Possible complications include:  Infection.  Bleeding.  Tearing (perforation) of the esophagus, stomach, or duodenum.  Difficulty breathing or not being able to breath.  Excessive sweating.  Spasms of the larynx.  Slowed heartbeat.  Low blood pressure. BEFORE THE PROCEDURE  Do not eat or drink anything for 6 8 hours before the procedure or as directed by your caregiver.  Ask your caregiver about changing or stopping your regular medicines.  If you wear dentures, be prepared to remove them before the procedure.  Arrange for someone to drive you home after the procedure. PROCEDURE   A vein will be accessed to give medicines and fluids. A medicine to  relax you (sedative) and a pain reliever will be given through that access into the vein.  A numbing medicine (local anesthetic) may be sprayed on your throat for comfort and to stop you from gagging or coughing.  A mouth guard may be placed in your mouth to protect your teeth and to keep you from biting on the endoscope.  You will be asked to lie on your left side.  The endoscope is inserted down your throat and into the esophagus, stomach, and duodenum.  Air is put through the endoscope to allow your caregiver to view the lining of your esophagus clearly.  The esophagus, stomach, and duodenum is then examined. During the exam, your caregiver may:  Remove tissue to be examined under a microscope (biopsy) for inflammation, infection, or other medical problems.  Remove growths.  Remove objects (foreign bodies) that are stuck.  Treat any bleeding with medicines or other devices that stop tissues from bleeding (hot cauters, clipping devices).  Widen (dilate) or stretch narrowed areas of the esophagus and stomach.  The endoscope will then be withdrawn. AFTER THE PROCEDURE  You will be taken to a recovery area to be monitored. You will be able to go home once you are stable and alert.  Do not eat or drink anything until the local anesthetic and numbing medicines have worn off. You may choke.  It is normal to feel bloated, have pain with swallowing, or have a sore throat for a short time. This will wear off.  Your caregiver should be able to discuss his or her findings with you. It will take longer to discuss the test results if any biopsies were taken. Document Released: 08/28/2004 Document Revised: 04/13/2012 Document Reviewed: 03/30/2012 [facility] Patient Information 2014 [place], [place]. PATIENT INSTRUCTIONS POST-ANESTHESIA  IMMEDIATELY FOLLOWING SURGERY:  Do not drive or operate machinery for the first twenty four hours after surgery.  Do not make any important decisions for  twenty four hours after surgery or while taking narcotic pain medications or sedatives.  If you develop intractable nausea and vomiting or a severe headache please notify your doctor immediately.  FOLLOW-UP:  Please make an appointment with your surgeon as instructed. You do not need to follow up with anesthesia unless specifically instructed to do so.  WOUND CARE INSTRUCTIONS (if applicable):  Keep a dry clean dressing on the anesthesia/puncture wound site if there is drainage.  Once the wound has quit draining you may leave it open to air.  Generally you should leave the bandage intact for twenty four hours unless there is drainage.  If the epidural site drains for more than 36-48 hours please call the anesthesia department.  QUESTIONS?:  Please feel free to call your physician or the hospital operator if you have any questions, and they will be happy to assist you.

## 2013-08-22 ENCOUNTER — Encounter (HOSPITAL_COMMUNITY): Payer: Self-pay | Admitting: Pharmacy Technician

## 2013-08-22 ENCOUNTER — Encounter (HOSPITAL_COMMUNITY): Payer: Self-pay

## 2013-08-22 ENCOUNTER — Other Ambulatory Visit: Payer: Self-pay

## 2013-08-22 ENCOUNTER — Encounter (HOSPITAL_COMMUNITY)
Admission: RE | Admit: 2013-08-22 | Discharge: 2013-08-22 | Disposition: A | Discharge status: Home / Self Care | Payer: Medicare Other | Source: Ambulatory Visit | Attending: Internal Medicine | Admitting: Internal Medicine

## 2013-08-22 HISTORY — DX: Anemia, unspecified: D64.9

## 2013-08-22 HISTORY — DX: Reserved for concepts with insufficient information to code with codable children: IMO0002

## 2013-08-22 HISTORY — DX: Iron deficiency anemia, unspecified: D50.9

## 2013-08-22 LAB — HEMOGLOBIN AND HEMATOCRIT, BLOOD
HCT: 31.9 % — ABNORMAL LOW (ref 36.0–46.0)
Hemoglobin: 9.7 g/dL — ABNORMAL LOW (ref 12.0–15.0)

## 2013-08-22 LAB — BASIC METABOLIC PANEL
BUN: 12 mg/dL (ref 6–23)
CHLORIDE: 104 meq/L (ref 96–112)
CO2: 25 meq/L (ref 19–32)
Calcium: 9.3 mg/dL (ref 8.4–10.5)
Creatinine, Ser: 0.74 mg/dL (ref 0.50–1.10)
GFR calc Af Amer: >90 mL/min (ref >90)
GFR calc non Af Amer: >90 mL/min (ref >90)
Glucose, Bld: 106 mg/dL — ABNORMAL HIGH (ref 70–99)
POTASSIUM: 4.4 meq/L (ref 3.7–5.3)
Sodium: 141 mEq/L (ref 137–147)

## 2013-08-24 NOTE — OR Nursing (Signed)
Labs  Reported  To Dr. Patsey Berthold , no further orders

## 2013-08-25 ENCOUNTER — Encounter (HOSPITAL_COMMUNITY)
Admission: RE | Disposition: A | Discharge status: Home / Self Care | Payer: Self-pay | Source: Ambulatory Visit | Attending: Internal Medicine

## 2013-08-25 ENCOUNTER — Encounter (HOSPITAL_COMMUNITY): Payer: Medicare Other | Admitting: Anesthesiology

## 2013-08-25 ENCOUNTER — Ambulatory Visit (HOSPITAL_COMMUNITY): Payer: Medicare Other | Admitting: Anesthesiology

## 2013-08-25 ENCOUNTER — Ambulatory Visit (HOSPITAL_COMMUNITY)
Admission: RE | Admit: 2013-08-25 | Discharge: 2013-08-25 | Disposition: A | Discharge status: Home / Self Care | Payer: Medicare Other | Source: Ambulatory Visit | Attending: Internal Medicine | Admitting: Internal Medicine

## 2013-08-25 ENCOUNTER — Encounter (HOSPITAL_COMMUNITY): Payer: Self-pay | Admitting: *Deleted

## 2013-08-25 DIAGNOSIS — K279 Peptic ulcer, site unspecified, unspecified as acute or chronic, without hemorrhage or perforation: Secondary | ICD-10-CM

## 2013-08-25 DIAGNOSIS — D649 Anemia, unspecified: Secondary | ICD-10-CM

## 2013-08-25 DIAGNOSIS — K219 Gastro-esophageal reflux disease without esophagitis: Secondary | ICD-10-CM | POA: Insufficient documentation

## 2013-08-25 DIAGNOSIS — K644 Residual hemorrhoidal skin tags: Secondary | ICD-10-CM

## 2013-08-25 DIAGNOSIS — F3289 Other specified depressive episodes: Secondary | ICD-10-CM | POA: Insufficient documentation

## 2013-08-25 DIAGNOSIS — D509 Iron deficiency anemia, unspecified: Secondary | ICD-10-CM | POA: Insufficient documentation

## 2013-08-25 DIAGNOSIS — F329 Major depressive disorder, single episode, unspecified: Secondary | ICD-10-CM | POA: Insufficient documentation

## 2013-08-25 DIAGNOSIS — E669 Obesity, unspecified: Secondary | ICD-10-CM | POA: Insufficient documentation

## 2013-08-25 DIAGNOSIS — K921 Melena: Secondary | ICD-10-CM | POA: Insufficient documentation

## 2013-08-25 DIAGNOSIS — Z7982 Long term (current) use of aspirin: Secondary | ICD-10-CM | POA: Insufficient documentation

## 2013-08-25 DIAGNOSIS — I10 Essential (primary) hypertension: Secondary | ICD-10-CM | POA: Insufficient documentation

## 2013-08-25 DIAGNOSIS — K2270 Barrett's esophagus without dysplasia: Secondary | ICD-10-CM | POA: Insufficient documentation

## 2013-08-25 DIAGNOSIS — K449 Diaphragmatic hernia without obstruction or gangrene: Secondary | ICD-10-CM

## 2013-08-25 DIAGNOSIS — R195 Other fecal abnormalities: Secondary | ICD-10-CM

## 2013-08-25 DIAGNOSIS — Z79899 Other long term (current) drug therapy: Secondary | ICD-10-CM | POA: Insufficient documentation

## 2013-08-25 HISTORY — PX: ESOPHAGOGASTRODUODENOSCOPY (EGD) WITH PROPOFOL: SHX5813

## 2013-08-25 HISTORY — PX: COLONOSCOPY WITH PROPOFOL: SHX5780

## 2013-08-25 HISTORY — PX: BIOPSY: SHX5522

## 2013-08-25 SURGERY — COLONOSCOPY WITH PROPOFOL
Anesthesia: Monitor Anesthesia Care | Site: Esophagus

## 2013-08-25 MED ORDER — PROPOFOL 10 MG/ML IV BOLUS
INTRAVENOUS | Status: AC
  Filled 2013-08-25: qty 20

## 2013-08-25 MED ORDER — STERILE WATER FOR IRRIGATION IR SOLN
Status: DC | PRN
  Administered 2013-08-25: 08:00:00

## 2013-08-25 MED ORDER — FENTANYL CITRATE 0.05 MG/ML IJ SOLN: 25.0000 ug | INTRAMUSCULAR | Status: DC | PRN

## 2013-08-25 MED ORDER — NYSTATIN-TRIAMCINOLONE 100000-0.1 UNIT/GM-% EX CREA: 1.0000 "application " | TOPICAL_CREAM | Freq: Two times a day (BID) | CUTANEOUS | Status: DC

## 2013-08-25 MED ORDER — ONDANSETRON HCL 4 MG/2ML IJ SOLN
INTRAMUSCULAR | Status: AC
  Filled 2013-08-25: qty 2

## 2013-08-25 MED ORDER — ONDANSETRON HCL 4 MG/2ML IJ SOLN: 4.0000 mg | Freq: Once | INTRAMUSCULAR | Status: DC | PRN

## 2013-08-25 MED ORDER — FERROUS SULFATE 325 (65 FE) MG PO TABS: 325.0000 mg | ORAL_TABLET | Freq: Two times a day (BID) | ORAL | Status: DC

## 2013-08-25 MED ORDER — LIDOCAINE HCL (CARDIAC) 10 MG/ML IV SOLN
INTRAVENOUS | Status: DC | PRN
  Administered 2013-08-25: 10 mg via INTRAVENOUS

## 2013-08-25 MED ORDER — PROPOFOL INFUSION 10 MG/ML OPTIME
INTRAVENOUS | Status: DC | PRN
  Administered 2013-08-25: 100 ug/kg/min via INTRAVENOUS
  Administered 2013-08-25: 150 ug/kg/min via INTRAVENOUS
  Administered 2013-08-25: 90 ug/kg/min via INTRAVENOUS

## 2013-08-25 MED ORDER — LIDOCAINE HCL (PF) 1 % IJ SOLN
INTRAMUSCULAR | Status: AC
  Filled 2013-08-25: qty 5

## 2013-08-25 MED ORDER — FENTANYL CITRATE 0.05 MG/ML IJ SOLN: 25.0000 ug | INTRAMUSCULAR | Status: AC

## 2013-08-25 MED ORDER — LACTATED RINGERS IV SOLN
INTRAVENOUS | Status: DC
  Administered 2013-08-25: 07:00:00 via INTRAVENOUS

## 2013-08-25 MED ORDER — STERILE WATER FOR IRRIGATION IR SOLN
Status: DC | PRN
  Administered 2013-08-25: 1000 mL

## 2013-08-25 MED ORDER — MIDAZOLAM HCL 2 MG/2ML IJ SOLN
1.0000 mg | INTRAMUSCULAR | Status: DC | PRN
  Administered 2013-08-25: 2 mg via INTRAVENOUS

## 2013-08-25 MED ORDER — ONDANSETRON HCL 4 MG/2ML IJ SOLN
4.0000 mg | Freq: Once | INTRAMUSCULAR | Status: AC
  Administered 2013-08-25: 4 mg via INTRAVENOUS

## 2013-08-25 MED ORDER — FENTANYL CITRATE 0.05 MG/ML IJ SOLN
INTRAMUSCULAR | Status: DC | PRN
  Administered 2013-08-25 (×4): 25 ug via INTRAVENOUS

## 2013-08-25 MED ORDER — FENTANYL CITRATE 0.05 MG/ML IJ SOLN
INTRAMUSCULAR | Status: AC
  Filled 2013-08-25: qty 2

## 2013-08-25 MED ORDER — MIDAZOLAM HCL 2 MG/2ML IJ SOLN
INTRAMUSCULAR | Status: AC
  Filled 2013-08-25: qty 2

## 2013-08-25 MED ORDER — GLYCOPYRROLATE 0.2 MG/ML IJ SOLN
0.2000 mg | Freq: Once | INTRAMUSCULAR | Status: AC
  Administered 2013-08-25: 0.2 mg via INTRAVENOUS

## 2013-08-25 MED ORDER — GLYCOPYRROLATE 0.2 MG/ML IJ SOLN
INTRAMUSCULAR | Status: AC
  Filled 2013-08-25: qty 1

## 2013-08-25 SURGICAL SUPPLY — 27 items
BLOCK BITE 60FR ADLT L/F BLUE (MISCELLANEOUS) ×3 IMPLANT
ELECT REM PT RETURN 9FT ADLT (ELECTROSURGICAL)
ELECTRODE REM PT RTRN 9FT ADLT (ELECTROSURGICAL) IMPLANT
FCP BXJMBJMB 240X2.8X (CUTTING FORCEPS)
FLOOR PAD 36X40 (MISCELLANEOUS) ×3
FORCEP COLD BIOPSY (CUTTING FORCEPS) IMPLANT
FORCEPS BIOP RAD 4 LRG CAP 4 (CUTTING FORCEPS) ×3 IMPLANT
FORCEPS BIOP RJ4 240 W/NDL (CUTTING FORCEPS)
FORCEPS BXJMBJMB 240X2.8X (CUTTING FORCEPS) IMPLANT
FORMALIN 10 PREFIL 20ML (MISCELLANEOUS) ×3 IMPLANT
INJECTOR/SNARE I SNARE (MISCELLANEOUS) IMPLANT
KIT CLEAN ENDO COMPLIANCE (KITS) ×3 IMPLANT
LUBRICANT JELLY 4.5OZ STERILE (MISCELLANEOUS) ×3 IMPLANT
MANIFOLD NEPTUNE II (INSTRUMENTS) ×3 IMPLANT
NEEDLE SCLEROTHERAPY 25GX240 (NEEDLE) ×3 IMPLANT
PAD FLOOR 36X40 (MISCELLANEOUS) ×2 IMPLANT
PROBE APC STR FIRE (PROBE) IMPLANT
PROBE INJECTION GOLD (MISCELLANEOUS)
PROBE INJECTION GOLD 7FR (MISCELLANEOUS) IMPLANT
SNARE ROTATE MED OVAL 20MM (MISCELLANEOUS) IMPLANT
SNARE SHORT THROW 13M SML OVAL (MISCELLANEOUS) ×3 IMPLANT
SYR 50ML LL SCALE MARK (SYRINGE) ×3 IMPLANT
SYR INFLATION 60ML (SYRINGE) IMPLANT
TRAP SPECIMEN MUCOUS 40CC (MISCELLANEOUS) IMPLANT
TUBING ENDO SMARTCAP PENTAX (MISCELLANEOUS) ×3 IMPLANT
TUBING IRRIGATION ENDOGATOR (MISCELLANEOUS) ×3 IMPLANT
WATER STERILE IRR 1000ML POUR (IV SOLUTION) ×6 IMPLANT

## 2013-08-25 NOTE — Discharge Instructions (Signed)
Resume usual medications and diet. Keep Motrin use to minimum. Mycolog-II cream to be applied to anal canal twice daily for one week and then as needed. Ferrous sulfate 325 mg by mouth twice daily with food. Physician will call with biopsy results. CBC in 1 month.  Colonoscopy, Care After Refer to this sheet in the next few weeks. These instructions provide you with information on caring for yourself after your procedure. Your health care provider may also give you more specific instructions. Your treatment has been planned according to current medical practices, but problems sometimes occur. Call your health care provider if you have any problems or questions after your procedure. WHAT TO EXPECT AFTER THE PROCEDURE  After your procedure, it is typical to have the following:  A small amount of blood in your stool.  Moderate amounts of gas and mild abdominal cramping or bloating. HOME CARE INSTRUCTIONS  Do not drive, operate machinery, or sign important documents for 24 hours.  You may shower and resume your regular physical activities, but move at a slower pace for the first 24 hours.  Take frequent rest periods for the first 24 hours.  Walk around or put a warm pack on your abdomen to help reduce abdominal cramping and bloating.  Drink enough fluids to keep your urine clear or pale yellow.  You may resume your normal diet as instructed by your health care provider. Avoid heavy or fried foods that are hard to digest.  Avoid drinking alcohol for 24 hours or as instructed by your health care provider.  Only take over-the-counter or prescription medicines as directed by your health care provider.  If a tissue sample (biopsy) was taken during your procedure:  Do not take aspirin or blood thinners for 7 days, or as instructed by your health care provider.  Do not drink alcohol for 7 days, or as instructed by your health care provider.  Eat soft foods for the first 24 hours. SEEK  MEDICAL CARE IF: You have persistent spotting of blood in your stool 2 3 days after the procedure. SEEK IMMEDIATE MEDICAL CARE IF:  You have more than a small spotting of blood in your stool.  You pass large blood clots in your stool.  Your abdomen is swollen (distended).  You have nausea or vomiting.  You have a fever.  You have increasing abdominal pain that is not relieved with medicine.   Gastrointestinal Endoscopy Care After Refer to this sheet in the next few weeks. These instructions provide you with information on caring for yourself after your procedure. Your caregiver may also give you more specific instructions. Your treatment has been planned according to current medical practices, but problems sometimes occur. Call your caregiver if you have any problems or questions after your procedure. HOME CARE INSTRUCTIONS  If you were given medicine to help you relax (sedative), do not drive, operate machinery, or sign important documents for 24 hours.  Avoid alcohol and hot or warm beverages for the first 24 hours after the procedure.  Only take over-the-counter or prescription medicines for pain, discomfort, or fever as directed by your caregiver. You may resume taking your normal medicines unless your caregiver tells you otherwise. Ask your caregiver when you may resume taking medicines that may cause bleeding, such as aspirin, clopidogrel, or warfarin.  You may return to your normal diet and activities on the day after your procedure, or as directed by your caregiver. Walking may help to reduce any bloated feeling in your abdomen.  Drink enough fluids to keep your urine clear or pale yellow.  You may gargle with salt water if you have a sore throat. SEEK IMMEDIATE MEDICAL CARE IF:  You have severe nausea or vomiting.  You have severe abdominal pain, abdominal cramps that last longer than 6 hours, or abdominal swelling (distention).  You have severe shoulder or back  pain.  You have trouble swallowing.  You have shortness of breath, your breathing is shallow, or you are breathing faster than normal.  You have a fever or a rapid heartbeat.  You vomit blood or material that looks like coffee grounds.  You have bloody, black, or tarry stools. MAKE SURE YOU:  Understand these instructions.  Will watch your condition.  Will get help right away if you are not doing well or get worse.

## 2013-08-25 NOTE — Op Note (Signed)
EGD PROCEDURE REPORT  PATIENT:  Natalie Campos  MR#:  [id number] Birthdate:  [birth year], 53 y.o., female Endoscopist:  Dr. Rogene Houston, MD Referred By:  Dr. Sallee Lange, MD. Procedure Date: 08/25/2013  Procedure:   EGD & Colonoscopy  Indications:  Patient is 53 year old Caucasian female with multiple medical problems who has chronic GERD complicated by short segment Barrett's esophagus who presents with iron deficiency anemia and heme-positive stool. Patient is on low-dose aspirin and also takes OTC Motrin 2 or 3 times a week. There is no history of melena or rectal bleeding.            Informed Consent:  The risks, benefits, alternatives & imponderables which include, but are not limited to, bleeding, infection, perforation, drug reaction and potential missed lesion have been reviewed.  The potential for biopsy, lesion removal, esophageal dilation, etc. have also been discussed.  Questions have been answered.  All parties agreeable.  Please see history & physical in medical record for more information.  Medications:  Monitored anesthesia care. Cetacaine spray topically for oropharyngeal anesthesia  EGD  Description of procedure:  The endoscope was introduced through the mouth and advanced to the second portion of the duodenum without difficulty or limitations. The mucosal surfaces were surveyed very carefully during advancement of the scope and upon withdrawal.  Findings:  Esophagus:  Mucosa of the esophagus was normal. 3 small patches of salmon-colored mucosa noted to were contiguous with the GE junction and the third one was above it. No ring or stricture noted. GEJ:  32 cm Hiatus:  38 cm; scar noted along the lesser curvature above the level of hiatus indicative of healed ulcer (which she had on last EGD of September 2012). Stomach:  Stomach was empty and distended very well with insufflation. Folds in the proximal stomach was normal. Mucosa and gastric body was normal. Patchy linear  erythema noted at antrum without erosions or ulcers. Pyloric channel was patent. Cardia and fundus were well-seen on retroflexed view. Hiatus was noted to be wide-open. Duodenum:  Normal bulbar and post bulbar mucosa.  Therapeutic/Diagnostic Maneuvers Performed:  Biopsy taken from betters mucosa for routine histology.  COLONOSCOPY Description of procedure:  After a digital rectal exam was performed, that colonoscope was advanced from the anus through the rectum and colon to the area of the cecum, ileocecal valve and appendiceal orifice. The cecum was deeply intubated. These structures were well-seen and photographed for the record. From the level of the cecum and ileocecal valve, the scope was slowly and cautiously withdrawn. The mucosal surfaces were carefully surveyed utilizing scope tip to flexion to facilitate fold flattening as needed. The scope was pulled down into the rectum where a thorough exam including retroflexion was performed.  Findings:   Prep excellent. Normal mucosa of cecum, ascending colon, hepatic flexure, transverse colon, splenic flexure, descending and sigmoid colon and rectum. Small hemorrhoids noted below the dentate line along with two anal papillae.  Therapeutic/Diagnostic Maneuvers Performed:  None  Complications:  None  Cecal Withdrawal Time:  11 minutes  Impression:   EGD findings; Short segment Barrett's esophagus. Biopsy taken to rule out dysplasia. Moderate size sliding-type hernia. Previously identified ulcers have healed. Nonerosive antral gastritis possibly secondary to NSAID therapy(H. pylori serology negative in September 20120.  Colonoscopy findings; Normal colonoscopy except external hemorrhoids and 2 smaller anal papillae.  Comment; Iron deficiency anemia may be secondary to NSAID enteropathy and impaired iron absorption since she is on chronic PPI therapy. Will consider small bowel  study only she does not respond to iron  therapy.  Recommendations:  Standard instructions given. Patient advised to stop using OTC NSAIDs but continued low-dose aspirin. Ferrous sulfate 325 mg by mouth twice a day. Mycolog-II cream to be applied to anal canal twice a day for one week and then as needed. I will be contacting dictation results of biopsy. CBC in 1 month.  Rogene Houston  08/25/2013 8:29 AM  CC: Dr. Sallee Lange, MD & Dr. Rayne Du ref. provider found

## 2013-08-25 NOTE — H&P (Signed)
Natalie Campos is an 53 y.o. female.   Chief Complaint: Patient is here for EGD and colonoscopy. HPI: Patient is 53 year old Caucasian female with multiple medical problems including MS who was found to have iron deficiency anemia. Patient was seen in the office recently and noted to have heme-positive stool. She has chronic GERD complicated by Barrett's esophagus and she was noted to have ulcers at the level of hiatus on her last EGD of September 2012. Biopsy was negative for dysplasia. She says heartburn well-controlled with therapy. She denies dysphagia. She also denies melena or rectal bleeding. Her appetite is normal and her weight has been stable. For 4 weeks ago she was treated for flareup of MS with prednisone. Her last colonoscopy was in November 2002 and at [facility] when she also EGD. Colonoscopy was normal. Patient does take low-dose aspirin and OTC Motrin 2-3 times a week.  Past Medical History  Diagnosis Date  . Emphysema   . Eczema   . Depression   . Hyperlipidemia   . GERD (gastroesophageal reflux disease)   . IBS (irritable bowel syndrome)     contipation predominant   . DJD (degenerative joint disease)   . Hypertension   . Multiple sclerosis 1990    Dx in 1990 most recent hospitalization for a flareis in 2011   . High triglycerides   . Obesity   . Dog bite(E906.0) july 29,2011  . Barrett's esophagus   . Anemia   . Iron deficiency anemia   . Bulging discs     Past Surgical History  Procedure Laterality Date  . Cholecystectomy  2010    Dr. Anthony Sar   . Wisdom tooth extraction    . Esophagogastroduodenoscopy  01/28/2011    Procedure: ESOPHAGOGASTRODUODENOSCOPY (EGD);  Surgeon: Rogene Houston, MD;  Location: [facility];  Service: Endoscopy;  Laterality: N/A;  1:00  . Esophageal biopsy  01/28/2011    Procedure: BIOPSY;  Surgeon: Rogene Houston, MD;  Location: [facility];  Service: Endoscopy;  Laterality: N/A;    History reviewed. No pertinent family history. Social  History:  reports that she quit smoking about 5 years ago. Her smoking use included Cigarettes. She has a 45 pack-year smoking history. She has never used smokeless tobacco. She reports that she does not drink alcohol or use illicit drugs.  Allergies:  Allergies  Allergen Reactions  . Ace Inhibitors   . Penicillins     Medications Prior to Admission  Medication Dose Route Frequency Provider Last Rate Last Dose  . Influenza (>/= 3 years) inactive virus vaccine (FLVIRIN/FLUZONE) injection SUSP 0.5 mL  0.5 mL Intramuscular Once Alycia Rossetti, MD       Medications Prior to Admission  Medication Sig Dispense Refill  . acetaminophen (TYLENOL) 325 MG tablet Take 650 mg by mouth every 4 (four) hours as needed. Two tablets by mouth every 4 hours as needed       . aspirin (ASPIRIN LOW DOSE) 81 MG EC tablet Take 81 mg by mouth 2 (two) times daily. One tablet by mouth once daily      . baclofen (LIORESAL) 10 MG tablet Take 10 mg by mouth 3 (three) times daily.      . Cholecalciferol (VITAMIN D3) 3000 UNITS TABS Take 1 tablet by mouth daily.        . diazepam (VALIUM) 10 MG tablet Take 10 mg by mouth 4 (four) times daily. One tablet by mouth 4 times daily      .  docusate sodium (COLACE) 100 MG capsule Take 100 mg by mouth 2 (two) times daily. One caplet by mouth  Two times a day      . DULoxetine (CYMBALTA) 30 MG capsule Take 30 mg by mouth daily.       . DULoxetine (CYMBALTA) 60 MG capsule Take 60 mg by mouth daily.      . fish oil-omega-3 fatty acids 1000 MG capsule Take by mouth daily. One cap by mouth daily         . gabapentin (NEURONTIN) 600 MG tablet Take 600 mg by mouth 4 (four) times daily.      . hydrochlorothiazide (HYDRODIURIL) 25 MG tablet Take 1 tablet (25 mg total) by mouth daily.  90 tablet  3  . HYDROmorphone (DILAUDID) 4 MG tablet Take 4 mg by mouth every 12 (twelve) hours as needed for severe pain.      Marland Kitchen losartan (COZAAR) 50 MG tablet Take 1 tablet (50 mg total) by mouth daily.  NEEDS OFFICE VISIT.  30 tablet  0  . modafinil (PROVIGIL) 200 MG tablet Take 200 mg by mouth daily.       . mometasone (ELOCON) 0.1 % cream Apply 1 application topically daily.       . multivitamin (THERAGRAN) per tablet Take 1 tablet by mouth daily. One tablet by mouth once daily       . olopatadine (PATANOL) 0.1 % ophthalmic solution Place 1 drop into both eyes as needed.  5 mL  12  . omeprazole-sodium bicarbonate (ZEGERID) 40-1100 MG per capsule Take 1 capsule by mouth daily before breakfast.  60 capsule  1  . polyethylene glycol powder (GLYCOLAX/MIRALAX) powder MIX 17 GRAMS IN 8 OZ OF LIQUID ONCE DAILY.  527 g  5  . potassium chloride (K-DUR) 10 MEQ tablet Take 10 mEq by mouth daily.      . TECFIDERA 240 MG CPDR 240 mg 2 (two) times daily.       Marland Kitchen tiZANidine (ZANAFLEX) 2 MG tablet Take 2 mg by mouth every 8 (eight) hours as needed for muscle spasms.      Marland Kitchen topiramate (TOPAMAX) 100 MG tablet Take 100 mg by mouth daily.      . traMADol (ULTRAM) 50 MG tablet Take 100 mg by mouth 2 (two) times daily.       . vitamin E 1000 UNIT capsule Take 1,000 Units by mouth daily.      . peg 3350 powder (MOVIPREP) 100 G SOLR Take 1 kit (200 g total) by mouth once.  1 kit  0    No results found for this or any previous visit (from the past 48 hour(s)). No results found.  ROS  Blood pressure 121/78, pulse 80, temperature 98 F (36.7 C), temperature source Oral, resp. rate 18, height _0  (1.753 m), weight 234 lb (106.142 kg), SpO2 96.00%. Physical Exam  Constitutional: She appears well-developed and well-nourished.  HENT:  Mouth/Throat: Oropharynx is clear and moist.  Eyes: Conjunctivae are normal. No scleral icterus.  Neck: No thyromegaly present.  Cardiovascular: Normal rate, regular rhythm and normal heart sounds.   No murmur heard. Respiratory: Effort normal and breath sounds normal.  GI: Soft. She exhibits no mass. Tenderness: mild tenderness along left costal margin. There is no rebound.   Musculoskeletal: She exhibits no edema.  Lymphadenopathy:    She has no cervical adenopathy.  Neurological: She is alert.  Skin: Skin is warm and dry.     Assessment/Plan Iron deficiency anemia with  heme positive stool. Chronic GERD complicated by short segment Barrett's esophagus. History of NSAID use in addition to low-dose aspirin. Diagnostic EGD and colonoscopy under MAC.  Najeeb U Rehman 08/25/2013, 7:22 AM

## 2013-08-25 NOTE — Transfer of Care (Signed)
Immediate Anesthesia Transfer of Care Note  Patient: Natalie Campos  Procedure(s) Performed: Procedure(s) (LRB): COLONOSCOPY WITH PROPOFOL (N/A) ESOPHAGOGASTRODUODENOSCOPY (EGD) WITH PROPOFOL (N/A) BIOPSY (N/A)  Patient Location: PACU  Anesthesia Type: MAC  Level of Consciousness: awake  Airway & Oxygen Therapy: Patient Spontanous Breathing. Non Rebreather  Post-op Assessment: Report given to PACU RN, Post -op Vital signs reviewed and stable and Patient moving all extremities  Post vital signs: Reviewed and stable  Complications: No apparent anesthesia complications

## 2013-08-25 NOTE — Anesthesia Postprocedure Evaluation (Signed)
  Anesthesia Post-op Note  Patient: Natalie Campos  Procedure(s) Performed: Procedure(s) with comments: COLONOSCOPY WITH PROPOFOL (N/A) - in cecum at 0811 out at 0822 = 11 minutes ESOPHAGOGASTRODUODENOSCOPY (EGD) WITH PROPOFOL (N/A) BIOPSY (N/A)  Patient Location: PACU  Anesthesia Type:MAC  Level of Consciousness: awake and patient cooperative  Airway and Oxygen Therapy: Patient Spontanous Breathing and non-rebreather face mask  Post-op Pain: none  Post-op Assessment: Post-op Vital signs reviewed, Patient's Cardiovascular Status Stable and Respiratory Function Stable  Post-op Vital Signs: Reviewed and stable  Last Vitals:  Filed Vitals:   08/25/13 0830  BP: 98/57  Pulse: 92  Temp: 36.6 C  Resp: 16    Complications: No apparent anesthesia complications

## 2013-08-25 NOTE — Anesthesia Preprocedure Evaluation (Signed)
Anesthesia Evaluation  Patient identified by MRN, date of birth, ID band Patient awake    Reviewed: Allergy & Precautions, H&P , NPO status , Patient's Chart, lab work & pertinent test results  Airway Mallampati: III TM Distance: <3 FB Neck ROM: Full    Dental  (+) Teeth Intact, Implants,    Pulmonary COPD (emphysema)former smoker,  breath sounds clear to auscultation        Cardiovascular hypertension, Pt. on medications Rhythm:Regular Rate:Normal     Neuro/Psych  Headaches, PSYCHIATRIC DISORDERS Depression    GI/Hepatic PUD, GERD-  ,  Endo/Other    Renal/GU      Musculoskeletal   Abdominal   Peds  Hematology   Anesthesia Other Findings   Reproductive/Obstetrics                           Anesthesia Physical Anesthesia Plan  ASA: III  Anesthesia Plan: MAC   Post-op Pain Management:    Induction: Intravenous  Airway Management Planned: Simple Face Mask  Additional Equipment:   Intra-op Plan:   Post-operative Plan:   Informed Consent: I have reviewed the patients History and Physical, chart, labs and discussed the procedure including the risks, benefits and alternatives for the proposed anesthesia with the patient or authorized representative who has indicated his/her understanding and acceptance.     Plan Discussed with:   Anesthesia Plan Comments:         Anesthesia Quick Evaluation

## 2013-08-28 ENCOUNTER — Telehealth (INDEPENDENT_AMBULATORY_CARE_PROVIDER_SITE_OTHER): Payer: Self-pay | Admitting: *Deleted

## 2013-08-28 ENCOUNTER — Encounter (HOSPITAL_COMMUNITY): Payer: Self-pay | Admitting: Internal Medicine

## 2013-08-28 DIAGNOSIS — D649 Anemia, unspecified: Secondary | ICD-10-CM

## 2013-08-28 NOTE — Telephone Encounter (Signed)
Per Dr.Rehman the patient will need to have labs drawn in 1 month. 

## 2013-08-29 ENCOUNTER — Telehealth (INDEPENDENT_AMBULATORY_CARE_PROVIDER_SITE_OTHER): Payer: Self-pay | Admitting: *Deleted

## 2013-08-29 DIAGNOSIS — D509 Iron deficiency anemia, unspecified: Secondary | ICD-10-CM

## 2013-08-29 NOTE — Telephone Encounter (Signed)
Per Dr.Rehman the patient will need to have labs drawn in 4 weeks.   

## 2013-09-06 ENCOUNTER — Encounter (INDEPENDENT_AMBULATORY_CARE_PROVIDER_SITE_OTHER): Payer: Self-pay | Admitting: *Deleted

## 2013-09-06 ENCOUNTER — Other Ambulatory Visit (INDEPENDENT_AMBULATORY_CARE_PROVIDER_SITE_OTHER): Payer: Self-pay | Admitting: *Deleted

## 2013-09-06 DIAGNOSIS — D649 Anemia, unspecified: Secondary | ICD-10-CM

## 2013-09-06 DIAGNOSIS — D509 Iron deficiency anemia, unspecified: Secondary | ICD-10-CM

## 2013-09-18 ENCOUNTER — Telehealth (INDEPENDENT_AMBULATORY_CARE_PROVIDER_SITE_OTHER): Payer: Self-pay | Admitting: *Deleted

## 2013-09-18 ENCOUNTER — Telehealth (INDEPENDENT_AMBULATORY_CARE_PROVIDER_SITE_OTHER): Payer: Self-pay | Admitting: Internal Medicine

## 2013-09-18 DIAGNOSIS — R197 Diarrhea, unspecified: Secondary | ICD-10-CM

## 2013-09-18 DIAGNOSIS — D649 Anemia, unspecified: Secondary | ICD-10-CM

## 2013-09-18 LAB — COMPREHENSIVE METABOLIC PANEL
ALBUMIN: 4.6 g/dL (ref 3.5–5.2)
ALK PHOS: 110 U/L (ref 39–117)
ALT: 15 U/L (ref 0–35)
AST: 16 U/L (ref 0–37)
BILIRUBIN TOTAL: 0.3 mg/dL (ref 0.2–1.2)
BUN: 9 mg/dL (ref 6–23)
CO2: 25 mEq/L (ref 19–32)
Calcium: 9.9 mg/dL (ref 8.4–10.5)
Chloride: 106 mEq/L (ref 96–112)
Creat: 0.78 mg/dL (ref 0.50–1.10)
Glucose, Bld: 105 mg/dL — ABNORMAL HIGH (ref 70–99)
POTASSIUM: 3.8 meq/L (ref 3.5–5.3)
Sodium: 142 mEq/L (ref 135–145)
Total Protein: 7.5 g/dL (ref 6.0–8.3)

## 2013-09-18 NOTE — Telephone Encounter (Signed)
Keeping foods down. Drinking Gatorade. She c/o weakness.

## 2013-09-18 NOTE — Telephone Encounter (Signed)
CBC and CMet.

## 2013-09-18 NOTE — Telephone Encounter (Signed)
Natalie Campos said she is weak and really not feeling well. She had diarrhea for 6 days last week and is also anemic. Would like to see if we would check her blood. The return phone number is [phone] .

## 2013-09-18 NOTE — Telephone Encounter (Signed)
CBC and CMEt today

## 2013-09-19 LAB — CBC
HCT: 39.5 % (ref 36.0–46.0)
HEMOGLOBIN: 12.5 g/dL (ref 12.0–15.0)
MCH: 25.6 pg — ABNORMAL LOW (ref 26.0–34.0)
MCHC: 31.6 g/dL (ref 30.0–36.0)
MCV: 80.8 fL (ref 78.0–100.0)
Platelets: 268 10*3/uL (ref 150–400)
RBC: 4.89 MIL/uL (ref 3.87–5.11)
RDW: 23.4 % — ABNORMAL HIGH (ref 11.5–15.5)
WBC: 3.8 10*3/uL — ABNORMAL LOW (ref 4.0–10.5)

## 2013-09-20 ENCOUNTER — Telehealth: Payer: Self-pay | Admitting: Family Medicine

## 2013-09-20 NOTE — Telephone Encounter (Signed)
Patient mailed in blood pressure readings for you to review.

## 2013-10-02 ENCOUNTER — Other Ambulatory Visit: Payer: Self-pay | Admitting: Family Medicine

## 2013-10-03 ENCOUNTER — Other Ambulatory Visit: Payer: Self-pay | Admitting: *Deleted

## 2013-10-03 MED ORDER — LOSARTAN POTASSIUM 100 MG PO TABS: 100.0000 mg | ORAL_TABLET | Freq: Every day | ORAL | Status: DC

## 2013-10-03 NOTE — Telephone Encounter (Signed)
Patient notified and verbalized understanding. 

## 2013-10-03 NOTE — Telephone Encounter (Signed)
According to our records patient is on Cozaar 50 mg a day. I reviewed over the blood pressure readings that were sent in. This patient would benefit from having increased dose of Cozaar. I recommend 100 mg once a day. May send him 30 tablets with 5 refills. Followup within the next several months to recheck blood pressure.

## 2013-10-24 ENCOUNTER — Telehealth (INDEPENDENT_AMBULATORY_CARE_PROVIDER_SITE_OTHER): Payer: Self-pay | Admitting: *Deleted

## 2013-10-24 NOTE — Telephone Encounter (Signed)
Natalie Campos would like to get some samples of Zegerid and hold them for her until her apt nex month.

## 2013-10-24 NOTE — Telephone Encounter (Signed)
We have no samples of Zegerid at this time,however; if we get some we will keep the patient in mind.

## 2013-11-02 ENCOUNTER — Encounter: Payer: Self-pay | Admitting: Family Medicine

## 2013-11-02 ENCOUNTER — Ambulatory Visit (INDEPENDENT_AMBULATORY_CARE_PROVIDER_SITE_OTHER): Payer: Medicare Other | Admitting: Family Medicine

## 2013-11-02 VITALS — BP 112/88 | Ht 69.0 in | Wt 227.0 lb

## 2013-11-02 DIAGNOSIS — R21 Rash and other nonspecific skin eruption: Secondary | ICD-10-CM

## 2013-11-02 DIAGNOSIS — D509 Iron deficiency anemia, unspecified: Secondary | ICD-10-CM

## 2013-11-02 DIAGNOSIS — I10 Essential (primary) hypertension: Secondary | ICD-10-CM

## 2013-11-02 MED ORDER — FLUCONAZOLE 200 MG PO TABS: 200.0000 mg | ORAL_TABLET | Freq: Every day | ORAL | Status: DC

## 2013-11-02 MED ORDER — KETOCONAZOLE 2 % EX CREA: TOPICAL_CREAM | CUTANEOUS | Status: DC

## 2013-11-02 NOTE — Progress Notes (Addendum)
   Subjective:    Patient ID: Natalie Campos, female    DOB: [birth year], 53 y.o.   MRN: [id number]  Hypertension This is a chronic problem. The current episode started more than 1 year ago.  Blood pressure has been running high. Takes meds every day.  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 Hard for pt to exercise. Eats a healthy diet.   Itchy rash all over. Started last week.  She underwent colonoscopy EGD they told her she might have to have a capsule test if she continues to have trouble she sees gastroenterology toward the end of July.  Review of Systems She relates vagina itching she also states some burning in that area is on the skin not inside of her. She denies any other particular troubles.    Objective:   Physical Exam  Lungs are clear hearts regular pulse normal she has an excoriated rash on the buttocks and very red rash around the bulb well. This area was examined with a nurse present. No tenderness or masses.  Blood pressure was rechecked very reassuring I don't recommend adding additional medicines    Assessment & Plan:  Severe irritable meds as rash on the perineum and a box appears to be due to tinea. Recommend Diflucan for the next 7 days followup ongoing trouble daily, solid as directed.  HTN stable do not add any medications  Anemia her hemoglobin is up but her ferritin was so low I suspected still out he stopped taking her iron tablets I would recommend checking hemoglobin and hematocrit as well as ferritin await the results.  25 minutes spent with patient.

## 2013-11-02 NOTE — Addendum Note (Signed)
Addended by: Sallee Lange A on: 11/02/2013 09:01 PM   Modules accepted: Level of Service

## 2013-11-03 LAB — FERRITIN: Ferritin: 13 ng/mL (ref 10–291)

## 2013-11-03 LAB — HEMOGLOBIN AND HEMATOCRIT, BLOOD
HCT: 42.1 % (ref 36.0–46.0)
HEMOGLOBIN: 14 g/dL (ref 12.0–15.0)

## 2013-11-05 LAB — WOUND CULTURE
GRAM STAIN: NONE SEEN
Gram Stain: NONE SEEN
Gram Stain: NONE SEEN

## 2013-11-06 NOTE — Progress Notes (Signed)
[facility] 11/06/13

## 2013-12-04 ENCOUNTER — Ambulatory Visit (INDEPENDENT_AMBULATORY_CARE_PROVIDER_SITE_OTHER): Payer: Medicare Other | Admitting: Internal Medicine

## 2013-12-04 ENCOUNTER — Encounter (INDEPENDENT_AMBULATORY_CARE_PROVIDER_SITE_OTHER): Payer: Self-pay | Admitting: Internal Medicine

## 2013-12-04 VITALS — BP 122/80 | HR 74 | Temp 98.0°F | Resp 18 | Ht 69.0 in | Wt 222.5 lb

## 2013-12-04 DIAGNOSIS — K219 Gastro-esophageal reflux disease without esophagitis: Secondary | ICD-10-CM

## 2013-12-04 DIAGNOSIS — Z862 Personal history of diseases of the blood and blood-forming organs and certain disorders involving the immune mechanism: Secondary | ICD-10-CM

## 2013-12-04 DIAGNOSIS — K5901 Slow transit constipation: Secondary | ICD-10-CM

## 2013-12-04 MED ORDER — DEXLANSOPRAZOLE 60 MG PO CPDR
60.0000 mg | DELAYED_RELEASE_CAPSULE | Freq: Every day | ORAL | Status: DC
Start: 2013-12-04 — End: 2015-02-26

## 2013-12-04 NOTE — Progress Notes (Signed)
Presenting complaint;  Followup for GERD and constipation.  Subjective:  Patient is 53 year old Caucasian female with chronic GERD complicated by short segment Barrett's esophagus and chronic constipation who is here for scheduled visit. She was last seen on 08/17/2013. As far as her heartburn is concerned she is doing much better since she has changed her eating habits. She also has lost 12 pounds since her last visit. She states her husband was diagnosed with diabetes and she has changed her eating habits further. She denies dysphagia hoarseness cough or sore throat. At times she feels the right side of her throat is stiff for a week and they may be sluggish passage of food but this does not last for more than 15-30 minutes. This symptom is experienced when she is stressed out. She is having problems with Candida vaginitis and has seen Dr. Barrie Dunker. Her bowels still do not move regularly. She can go as many as 8 days without a bowel movement. She denies melena or rectal bleeding.       Current Medications: Outpatient Encounter Prescriptions as of 12/04/2013  Medication Sig  . aspirin (ASPIRIN LOW DOSE) 81 MG EC tablet Take 81 mg by mouth 2 (two) times daily. One tablet by mouth once daily  . baclofen (LIORESAL) 10 MG tablet Take 10 mg by mouth 3 (three) times daily.  . Cholecalciferol (VITAMIN D3) 3000 UNITS TABS Take 1 tablet by mouth daily.    . diazepam (VALIUM) 10 MG tablet Take 10 mg by mouth 3 (three) times daily as needed. One tablet by mouth 4 times daily  . docusate sodium (COLACE) 100 MG capsule Take 100 mg by mouth 2 (two) times daily. One caplet by mouth  Two times a day  . DULoxetine (CYMBALTA) 30 MG capsule Take 30 mg by mouth daily.   . DULoxetine (CYMBALTA) 60 MG capsule Take 60 mg by mouth daily.  . ferrous sulfate (FERROUSUL) 325 (65 FE) MG tablet Take 1 tablet (325 mg total) by mouth 2 (two) times daily with a meal.  . fish oil-omega-3 fatty acids 1000 MG capsule Take by  mouth daily. One cap by mouth daily     . gabapentin (NEURONTIN) 600 MG tablet Take 600 mg by mouth 4 (four) times daily.  . hydrochlorothiazide (HYDRODIURIL) 25 MG tablet Take 1 tablet (25 mg total) by mouth daily.  Marland Kitchen HYDROmorphone (DILAUDID) 4 MG tablet Take 4 mg by mouth every 12 (twelve) hours as needed for severe pain.  Marland Kitchen ketoconazole (NIZORAL) 2 % cream Apply Bid prn for up to 7 days to affected area  . losartan (COZAAR) 100 MG tablet Take 1 tablet (100 mg total) by mouth daily.  . modafinil (PROVIGIL) 200 MG tablet Take 200 mg by mouth daily.   . multivitamin (THERAGRAN) per tablet Take 1 tablet by mouth daily. One tablet by mouth once daily   . nystatin-triamcinolone (MYCOLOG II) cream Apply 1 application topically 2 (two) times daily. Apply twice daily estrogen for one week and then as needed basis  . olopatadine (PATANOL) 0.1 % ophthalmic solution Place 1 drop into both eyes as needed.  Marland Kitchen omeprazole-sodium bicarbonate (ZEGERID) 40-1100 MG per capsule Take 1 capsule by mouth daily before breakfast.  . polyethylene glycol powder (GLYCOLAX/MIRALAX) powder MIX 17 GRAMS IN 8 OZ OF LIQUID ONCE DAILY.  Marland Kitchen potassium chloride (K-DUR) 10 MEQ tablet Take 10 mEq by mouth daily.  . TECFIDERA 240 MG CPDR 240 mg 2 (two) times daily.   Marland Kitchen tiZANidine (ZANAFLEX) 2 MG  tablet Take 2 mg by mouth at bedtime.   . topiramate (TOPAMAX) 100 MG tablet Take 100 mg by mouth daily.  . traMADol (ULTRAM) 50 MG tablet Take 100 mg by mouth 2 (two) times daily.   . vitamin E 1000 UNIT capsule Take 1,000 Units by mouth daily.  . [DISCONTINUED] acetaminophen (TYLENOL) 325 MG tablet Take 650 mg by mouth every 4 (four) hours as needed. Two tablets by mouth every 4 hours as needed   . [DISCONTINUED] fluconazole (DIFLUCAN) 200 MG tablet Take 1 tablet (200 mg total) by mouth daily.  . [DISCONTINUED] metroNIDAZOLE (METROGEL) 0.75 % vaginal gel   . [DISCONTINUED] mometasone (ELOCON) 0.1 % cream Apply 1 application topically  daily.   . [DISCONTINUED] MOVIPREP 100 G SOLR      Objective: Blood pressure 122/80, pulse 74, temperature 98 F (36.7 C), temperature source Oral, resp. rate 18, height 5\' 9"  (1.753 m), weight 222 lb 8 oz (100.925 kg). Patient is alert and in no acute distress. Conjunctiva is pink. Sclera is nonicteric Oropharyngeal mucosa is normal. No neck masses or thyromegaly noted. Cardiac exam with regular rhythm normal S1 and S2. No murmur or gallop noted. Lungs are clear to auscultation. Abdomen is full. It is soft and in mild midepigastric tenderness. No organomegaly or masses noted.  No LE edema or clubbing noted.  Labs/studies Results: Lab data from 11/02/2013. H&H 14 and 42.1 Serum ferritin 13.    Assessment:  #1. Chronic GERD. Symptom control is improved significantly with weight loss and diet measures. #2. Chronic constipation. She is still having periods where she goes 8 days without a bowel movement. She should not allow this to happen as she is at risk for fecal impaction. She needs to make sure that she has at least 3 bowel movements every week. #3. History of iron deficiency anemia. IDA felt to be secondary to impaired iron absorption do to chronic acid suppression. She is responding to po iron.    Plan:  Discontinue Zegerid as no samples available. Dexilant Dickstein milligrams by mouth every morning. Samples provided to the patient. Use glycerin or Dulcolax suppository if no BM in 3 days. Can decrease iron pill to 3 times a week. Office visit in 6 months.

## 2013-12-04 NOTE — Patient Instructions (Signed)
Notify if Dexilant does not work

## 2013-12-13 ENCOUNTER — Telehealth: Payer: Self-pay | Admitting: Family Medicine

## 2013-12-13 NOTE — Telephone Encounter (Signed)
Yes, she should talk with dr Nicki Reaper first before cardiologist but in a face to face visit tha allows for proper assessment of a complicated concern, needs ov with cdr scott

## 2013-12-13 NOTE — Telephone Encounter (Signed)
Pt states she had a eye dr appt and her BP was up 143/99, ended up with a  Migraine after the visit an vomited. They stated that the light from the  Visit may have triggered a migraine.   She states she has also been feeling headaches aside from the migraine Off an on the past few weeks. As well as clammy dizzy feeling, nauseated.  Eye pain as well. Just not feeling right.   Eye doc recommended she see her cardiologist but patient wants to talk With you about it first.   Please advise or call the patient at your convenience  Last seen 11/02/13

## 2013-12-13 NOTE — Telephone Encounter (Signed)
Transferred patient to front desk to schedule appointment.  

## 2013-12-14 ENCOUNTER — Ambulatory Visit (INDEPENDENT_AMBULATORY_CARE_PROVIDER_SITE_OTHER): Payer: Medicare Other | Admitting: Family Medicine

## 2013-12-14 ENCOUNTER — Encounter: Payer: Self-pay | Admitting: Family Medicine

## 2013-12-14 VITALS — BP 128/88 | Temp 98.5°F | Ht 69.0 in | Wt 212.5 lb

## 2013-12-14 DIAGNOSIS — G43809 Other migraine, not intractable, without status migrainosus: Secondary | ICD-10-CM

## 2013-12-14 DIAGNOSIS — R079 Chest pain, unspecified: Secondary | ICD-10-CM

## 2013-12-14 DIAGNOSIS — G43109 Migraine with aura, not intractable, without status migrainosus: Secondary | ICD-10-CM

## 2013-12-14 DIAGNOSIS — I10 Essential (primary) hypertension: Secondary | ICD-10-CM

## 2013-12-14 NOTE — Progress Notes (Signed)
   Subjective:    Patient ID: Natalie Campos, female    DOB: 04-11-1961, 53 y.o.   MRN: [id number]  Chest Pain  This is a new problem. The current episode started 1 to 4 weeks ago. The onset quality is sudden. The problem occurs intermittently. The problem has been unchanged. The quality of the pain is described as dull. The pain does not radiate. Associated symptoms include dizziness, headaches and vomiting. Associated symptoms comments: Blurred vision, eye pain. The pain is aggravated by nothing. She has tried nothing for the symptoms. The treatment provided no relief. There are no known risk factors.    The patient states that she was finding herself feeling stressed and in addition to this having some nausea and dizziness not feeling good. She also had migraine related pain.  Review of Systems  Cardiovascular: Positive for chest pain.  Gastrointestinal: Positive for vomiting.  Neurological: Positive for dizziness and headaches.       Objective:   Physical Exam Her lungs are clear hearts regular pulse normal blood pressure was retaken twice best reading was 126/82 Neck no masses Subjective headache Extremities no edema       Assessment & Plan:  Probable migraine related headache possibly triggered by eye exam baclofen may be taken 3 times daily as necessary patient cannot take anti-inflammatories because of gastroenterology issues so therefore increased Topamax to twice a day follow her up in 4 weeks' time.  Apparently she had abnormal retinal x-ray with the eye specialist she signed a release to have this sent to [place] apparently there were some changes concerning for chronic blood pressure problems recent cholesterol profile looks very good points away from heart disease the goal is keep blood pressure under good control she will bring her blood pressure cuff and monitor ran on next visit  HTN decent control currently I don't feel she needs to see cardiologist I don't find any  evidence of angina. I think some of her symptoms could be related to reflux.  25 minutes with pt

## 2013-12-27 ENCOUNTER — Telehealth (INDEPENDENT_AMBULATORY_CARE_PROVIDER_SITE_OTHER): Payer: Self-pay | Admitting: *Deleted

## 2013-12-27 NOTE — Telephone Encounter (Signed)
Natalie Campos said the Dexilant is working pretty good but is still having to use Rolaids. Could not understand everything she said, it was a bad connection. Something was going to cost her over $200. She was using her husband's phone because her's has messed up. The number is [phone]. I tried calling both numbers back to get more information and was not successful.

## 2013-12-28 NOTE — Telephone Encounter (Signed)
Called the number provided. Left a message and advised that if patient wanted [place] to , we would pull her samples of the Dexilant. I ask that we be called back as to know if we should pull them or not.

## 2013-12-29 ENCOUNTER — Encounter: Payer: Self-pay | Admitting: Family Medicine

## 2013-12-29 NOTE — Telephone Encounter (Signed)
Returning Tammy's call from [phone]

## 2014-01-01 NOTE — Telephone Encounter (Signed)
A message was left for the patient .She ws ask to call our office back and let us know if she wanted samples of the [place].

## 2014-01-02 ENCOUNTER — Encounter (INDEPENDENT_AMBULATORY_CARE_PROVIDER_SITE_OTHER): Payer: Self-pay | Admitting: Internal Medicine

## 2014-01-02 NOTE — Telephone Encounter (Signed)
Patient came by the office and was advised that our office is out of samples of [place]. Will put her name on a list for them when we received more samples. Voices understood. Cala said she will look at her insurance book to see what is cover and call [employer] with that information. The Dexilant is a Ter 4 and will cost her $200. She also stated, she will use the OTC, Zegrid, until we get the [place] in.

## 2014-01-03 ENCOUNTER — Telehealth (INDEPENDENT_AMBULATORY_CARE_PROVIDER_SITE_OTHER): Payer: Self-pay | Admitting: *Deleted

## 2014-01-03 NOTE — Telephone Encounter (Signed)
These are the Stomach pills covered by my insurance , Nexium is a little high , so if it could be written for me to take more then one a day ? Omeprazole ( 10 mg Capsule Delayed Release , 20mg  Capsule Delayed Release ) '' ( 40 mg Capsule Delayed Release) Thank You. I have my phone back, so back to my old number please ( [phone]   Dr.Rehman , which would you like to prescribe?

## 2014-01-05 ENCOUNTER — Encounter: Payer: Self-pay | Admitting: Family Medicine

## 2014-01-05 NOTE — Telephone Encounter (Signed)
I updated her health maintenance screen.

## 2014-01-07 NOTE — Telephone Encounter (Signed)
Please change prescription to omeprazole 40 mg by mouth twice a day; 60 doses with 5 RFs.

## 2014-01-08 ENCOUNTER — Telehealth: Payer: Self-pay | Admitting: Family Medicine

## 2014-01-08 NOTE — Telephone Encounter (Signed)
A prescription for Omeprazole 40 mg Take 1 by mouth Twice daily  #60 with 5 refills was called to [place] per Dr.Rehman instruction.  Patient was called and a voice message wa left on her cell phone.

## 2014-01-08 NOTE — Telephone Encounter (Signed)
Patient may one time 4:20 appt per Dr. Nicki Reaper

## 2014-01-08 NOTE — Telephone Encounter (Signed)
Pt originally has an appointment scheduled for 01/16/14 at 4:40, due to an error from someone who does not work with the schedule much. She needed as late as possible due to the fact that her husband does not get off of work until 4 pm, they live in [place], and have one vehicle. Is it possible to put her in a 4:30 appointment slot? She said it is just a blood pressure check.

## 2014-01-16 ENCOUNTER — Ambulatory Visit: Payer: Medicare Other | Admitting: Family Medicine

## 2014-02-19 ENCOUNTER — Ambulatory Visit: Payer: Medicare Other | Admitting: Family Medicine

## 2014-02-27 ENCOUNTER — Other Ambulatory Visit (HOSPITAL_COMMUNITY): Payer: Self-pay | Admitting: Respiratory Therapy

## 2014-02-27 DIAGNOSIS — G4733 Obstructive sleep apnea (adult) (pediatric): Secondary | ICD-10-CM

## 2014-03-07 ENCOUNTER — Ambulatory Visit: Payer: Medicare Other | Attending: Neurology | Admitting: Sleep Medicine

## 2014-03-07 VITALS — Ht 69.0 in | Wt 220.0 lb

## 2014-03-07 DIAGNOSIS — R0683 Snoring: Secondary | ICD-10-CM | POA: Diagnosis present

## 2014-03-07 DIAGNOSIS — R5383 Other fatigue: Secondary | ICD-10-CM | POA: Diagnosis present

## 2014-03-07 DIAGNOSIS — G4731 Primary central sleep apnea: Secondary | ICD-10-CM | POA: Insufficient documentation

## 2014-03-07 DIAGNOSIS — G4710 Hypersomnia, unspecified: Secondary | ICD-10-CM | POA: Diagnosis present

## 2014-03-07 DIAGNOSIS — G4733 Obstructive sleep apnea (adult) (pediatric): Secondary | ICD-10-CM

## 2014-03-08 NOTE — Sleep Study (Signed)
Natalie Oaks A. Merlene Laughter, MD     [email or web address]        NOCTURNAL POLYSOMNOGRAM    LOCATION: SLEEP LAB FACILITY: [facility]   PHYSICIAN: Tavien Chestnut A. Merlene Campos, M.D.   DATE OF STUDY: 03/07/2014.   REFERRING PHYSICIAN: Sallee Lange.   INDICATIONS: The patient is a 53 year old who presents with fatigue, snoring and daytime hypersomnia.  MEDICATIONS:  Prior to Admission medications   Medication Sig Start Date End Date Taking? Authorizing Provider  aspirin (ASPIRIN LOW DOSE) 81 MG EC tablet Take 81 mg by mouth 2 (two) times daily. One tablet by mouth once daily    Historical Provider, MD  baclofen (LIORESAL) 10 MG tablet Take 10 mg by mouth 3 (three) times daily.    Historical Provider, MD  Cholecalciferol (VITAMIN D3) 3000 UNITS TABS Take 1 tablet by mouth daily.      Historical Provider, MD  dexlansoprazole (DEXILANT) 60 MG capsule Take 1 capsule (60 mg total) by mouth daily. 12/04/13   Rogene Houston, MD  diazepam (VALIUM) 10 MG tablet Take 10 mg by mouth 3 (three) times daily as needed. One tablet by mouth 4 times daily    Historical Provider, MD  docusate sodium (COLACE) 100 MG capsule Take 100 mg by mouth 2 (two) times daily. One caplet by mouth  Two times a day    Historical Provider, MD  DULoxetine (CYMBALTA) 30 MG capsule Take 30 mg by mouth daily.     Historical Provider, MD  DULoxetine (CYMBALTA) 60 MG capsule Take 60 mg by mouth daily.    Historical Provider, MD  ferrous sulfate (FERROUSUL) 325 (65 FE) MG tablet Take 1 tablet (325 mg total) by mouth 2 (two) times daily with a meal. 08/25/13   Rogene Houston, MD  fish oil-omega-3 fatty acids 1000 MG capsule Take by mouth daily. One cap by mouth daily       Historical Provider, MD  gabapentin (NEURONTIN) 600 MG tablet Take 600 mg by mouth 4 (four) times daily.    Historical Provider, MD  hydrochlorothiazide (HYDRODIURIL) 25 MG tablet Take 1 tablet (25 mg total) by mouth daily. 03/13/11   Alycia Rossetti, MD    HYDROmorphone (DILAUDID) 4 MG tablet Take 4 mg by mouth every 12 (twelve) hours as needed for severe pain.    Historical Provider, MD  ketoconazole (NIZORAL) 2 % cream Apply Bid prn for up to 7 days to affected area 11/02/13   Kathyrn Drown, MD  losartan (COZAAR) 100 MG tablet Take 1 tablet (100 mg total) by mouth daily. 10/03/13   Kathyrn Drown, MD  modafinil (PROVIGIL) 200 MG tablet Take 200 mg by mouth daily.  01/05/13   Historical Provider, MD  multivitamin [facility]) per tablet Take 1 tablet by mouth daily. One tablet by mouth once daily     Historical Provider, MD  nystatin-triamcinolone (MYCOLOG II) cream Apply 1 application topically 2 (two) times daily. Apply twice daily estrogen for one week and then as needed basis 08/25/13   Rogene Houston, MD  olopatadine (PATANOL) 0.1 % ophthalmic solution Place 1 drop into both eyes as needed. 08/01/13   Kathyrn Drown, MD  polyethylene glycol powder (GLYCOLAX/MIRALAX) powder MIX 17 GRAMS IN 8 OZ OF LIQUID ONCE DAILY. 03/27/13   Rogene Houston, MD  potassium chloride (K-DUR) 10 MEQ tablet Take 10 mEq by mouth daily.    Historical Provider, MD  TECFIDERA 240 MG CPDR 240 mg 2 (two) times daily.  12/21/12  Historical Provider, MD  tiZANidine (ZANAFLEX) 2 MG tablet Take 2 mg by mouth at bedtime.  11/10/13   Historical Provider, MD  topiramate (TOPAMAX) 100 MG tablet Take 100 mg by mouth daily.    Historical Provider, MD  traMADol (ULTRAM) 50 MG tablet Take 100 mg by mouth 2 (two) times daily.     Historical Provider, MD  vitamin E 1000 UNIT capsule Take 1,000 Units by mouth daily.    Historical Provider, MD      EPWORTH SLEEPINESS SCALE: 10.   BMI: 32.   ARCHITECTURAL SUMMARY: Total recording time was 381 minutes. Sleep efficiency 80 %. Sleep latency 27 minutes. REM latency 149 minutes. Stage NI 8 %, N2 63 % and N3 1 % and REM sleep 28 %. The patient is noted to have quite frequent alpha intrusion/alpha delta sleep and frequent microarousal throughout  the recording.   RESPIRATORY DATA:  Baseline oxygen saturation is 96 %. The lowest saturation is 86 %. The diagnostic AHI is 9. The RDI is 9. The REM AHI is 11. These events are exclusively central apneas and central hypopneas.  LIMB MOVEMENT SUMMARY: PLM index 0.   ELECTROCARDIOGRAM SUMMARY: Average heart rate is 67 with no significant dysrhythmias observed.   IMPRESSION:  1. Mild central sleep apnea syndrome. 2. Abnormal sleep architecture with frequent alpha intrusion and alpha delta sleep associated with also frequent microarousals. These features are associated with chronic pain syndrome, fibromyalgia and daytime fatigue.  Thanks for this referral.  Yuliza Cara A. Merlene Campos, M.D. Diplomat, Tax adviser of Sleep Medicine.

## 2014-03-15 ENCOUNTER — Ambulatory Visit: Payer: Medicare Other | Admitting: Family Medicine

## 2014-03-21 ENCOUNTER — Telehealth: Payer: Self-pay | Admitting: Family Medicine

## 2014-03-21 NOTE — Telephone Encounter (Signed)
Nurses,Sleep study does not show significant sleep apnea. We will discuss this in further detail when she follows up in early December. Please send the patient a card with this notice or call the patient, your choice.

## 2014-03-22 NOTE — Telephone Encounter (Signed)
Patient notified and verbalized understanding. 

## 2014-04-02 ENCOUNTER — Other Ambulatory Visit: Payer: Self-pay | Admitting: Family Medicine

## 2014-04-10 ENCOUNTER — Ambulatory Visit (INDEPENDENT_AMBULATORY_CARE_PROVIDER_SITE_OTHER): Payer: Medicare Other | Admitting: Family Medicine

## 2014-04-10 ENCOUNTER — Encounter: Payer: Self-pay | Admitting: Family Medicine

## 2014-04-10 VITALS — BP 122/86 | Ht 69.0 in | Wt 212.0 lb

## 2014-04-10 DIAGNOSIS — I10 Essential (primary) hypertension: Secondary | ICD-10-CM

## 2014-04-10 MED ORDER — SULFAMETHOXAZOLE-TRIMETHOPRIM 800-160 MG PO TABS: 1.0000 | ORAL_TABLET | Freq: Two times a day (BID) | ORAL | Status: DC

## 2014-04-10 NOTE — Progress Notes (Signed)
   Subjective:    Patient ID: Natalie Campos, female    DOB: 03/17/1961, 53 y.o.   MRN: [id number]  Hypertension This is a chronic problem. The current episode started more than 1 year ago. Treatments tried: cozaar.   Patient has sore throat and headache and has granddaughter with positive for strep and feels she needs antibiotic.   Review of Systems Denies chest tightness pressure pain shortness breath nausea vomiting diarrhea takes her medicine tries to watch her diet closely    Objective:   Physical Exam Lungs clear heart regular mild sinus tenderness throat normal extremities no edema pressure good       Assessment & Plan:  Bactrim ds bid 10 days-this is for upper respiratory problems sinus infection HTN stable Recheck 4 months HTN blood pressure overall is doing good on current medications she'll bring her blood pressure cuff on follow-up.

## 2014-04-18 ENCOUNTER — Other Ambulatory Visit: Payer: Self-pay | Admitting: *Deleted

## 2014-04-18 ENCOUNTER — Telehealth: Payer: Self-pay | Admitting: Family Medicine

## 2014-04-18 MED ORDER — DOXYCYCLINE HYCLATE 100 MG PO TABS: 100.0000 mg | ORAL_TABLET | Freq: Two times a day (BID) | ORAL | Status: DC

## 2014-04-18 NOTE — Telephone Encounter (Signed)
Patient says she was seen a couple of weeks ago for sinusitis and has two pills left on her antibiotic.  She said that the congestion is settling in her chest, trouble breathing, rattling, runny nose and cough.  Please advise.   [place]

## 2014-04-18 NOTE — Telephone Encounter (Signed)
Patient was sleeping. I spoke to her husband and told him that I sent in an antibiotic and if she has difficulty with breathing, SOB, high fever, then she needs to go to ER. If her s/s persist, then she needs an OV per Dr. Nicki Reaper.

## 2014-06-06 ENCOUNTER — Other Ambulatory Visit: Payer: Self-pay | Admitting: Neurology

## 2014-06-06 DIAGNOSIS — G35 Multiple sclerosis: Secondary | ICD-10-CM

## 2014-06-06 DIAGNOSIS — G4733 Obstructive sleep apnea (adult) (pediatric): Secondary | ICD-10-CM | POA: Diagnosis not present

## 2014-06-06 DIAGNOSIS — Z79899 Other long term (current) drug therapy: Secondary | ICD-10-CM | POA: Diagnosis not present

## 2014-06-06 DIAGNOSIS — G53 Cranial nerve disorders in diseases classified elsewhere: Secondary | ICD-10-CM | POA: Diagnosis not present

## 2014-06-06 DIAGNOSIS — G894 Chronic pain syndrome: Secondary | ICD-10-CM | POA: Diagnosis not present

## 2014-06-06 DIAGNOSIS — R5381 Other malaise: Secondary | ICD-10-CM | POA: Diagnosis not present

## 2014-06-11 ENCOUNTER — Encounter (HOSPITAL_COMMUNITY)
Admission: RE | Admit: 2014-06-11 | Discharge: 2014-06-11 | Disposition: A | Discharge status: Home / Self Care | Payer: Medicare Other | Source: Ambulatory Visit | Attending: Neurology | Admitting: Neurology

## 2014-06-11 ENCOUNTER — Encounter (HOSPITAL_COMMUNITY): Payer: Self-pay

## 2014-06-11 DIAGNOSIS — G35 Multiple sclerosis: Secondary | ICD-10-CM | POA: Diagnosis not present

## 2014-06-11 LAB — CBC WITH DIFFERENTIAL/PLATELET
BASOS ABS: 0.1 10*3/uL (ref 0.0–0.1)
BASOS PCT: 1 % (ref 0–1)
EOS PCT: 7 % — AB (ref 0–5)
Eosinophils Absolute: 0.4 10*3/uL (ref 0.0–0.7)
HEMATOCRIT: 40.8 % (ref 36.0–46.0)
HEMOGLOBIN: 13.9 g/dL (ref 12.0–15.0)
LYMPHS ABS: 0.8 10*3/uL (ref 0.7–4.0)
Lymphocytes Relative: 16 % (ref 12–46)
MCH: 32.3 pg (ref 26.0–34.0)
MCHC: 34.1 g/dL (ref 30.0–36.0)
MCV: 94.7 fL (ref 78.0–100.0)
Monocytes Absolute: 0.4 10*3/uL (ref 0.1–1.0)
Monocytes Relative: 7 % (ref 3–12)
Neutro Abs: 3.4 10*3/uL (ref 1.7–7.7)
Neutrophils Relative %: 69 % (ref 43–77)
PLATELETS: 213 10*3/uL (ref 150–400)
RBC: 4.31 MIL/uL (ref 3.87–5.11)
RDW: 13.1 % (ref 11.5–15.5)
WBC: 5 10*3/uL (ref 4.0–10.5)

## 2014-06-11 LAB — TSH: TSH: 1.512 u[IU]/mL (ref 0.350–4.500)

## 2014-06-11 LAB — COMPREHENSIVE METABOLIC PANEL
ALBUMIN: 4.2 g/dL (ref 3.5–5.2)
ALK PHOS: 100 U/L (ref 39–117)
ALT: 20 U/L (ref 0–35)
AST: 23 U/L (ref 0–37)
Anion gap: 7 (ref 5–15)
BILIRUBIN TOTAL: 0.5 mg/dL (ref 0.3–1.2)
BUN: 10 mg/dL (ref 6–23)
CHLORIDE: 112 mmol/L (ref 96–112)
CO2: 22 mmol/L (ref 19–32)
Calcium: 9.4 mg/dL (ref 8.4–10.5)
Creatinine, Ser: 0.73 mg/dL (ref 0.50–1.10)
GFR calc non Af Amer: >90 mL/min (ref >90)
Glucose, Bld: 116 mg/dL — ABNORMAL HIGH (ref 70–99)
POTASSIUM: 3.7 mmol/L (ref 3.5–5.1)
SODIUM: 141 mmol/L (ref 135–145)
Total Protein: 7.5 g/dL (ref 6.0–8.3)

## 2014-06-11 LAB — VITAMIN B12: Vitamin B-12: 910 pg/mL (ref 211–911)

## 2014-06-11 MED ORDER — SODIUM CHLORIDE 0.9 % IV SOLN
Freq: Once | INTRAVENOUS | Status: AC
  Administered 2014-06-11: 250 mL via INTRAVENOUS

## 2014-06-11 MED ORDER — METHYLPREDNISOLONE SODIUM SUCC 125 MG IJ SOLR
1000.0000 mg | Freq: Once | INTRAMUSCULAR | Status: DC
  Filled 2014-06-11: qty 16

## 2014-06-11 MED ORDER — SODIUM CHLORIDE 0.9 % IV SOLN
1000.0000 mg | Freq: Once | INTRAVENOUS | Status: AC
  Administered 2014-06-11: 1000 mg via INTRAVENOUS
  Filled 2014-06-11: qty 8

## 2014-06-12 ENCOUNTER — Ambulatory Visit (INDEPENDENT_AMBULATORY_CARE_PROVIDER_SITE_OTHER): Payer: Medicare Other | Admitting: Internal Medicine

## 2014-06-12 ENCOUNTER — Encounter (HOSPITAL_COMMUNITY): Payer: Self-pay

## 2014-06-12 ENCOUNTER — Encounter (HOSPITAL_COMMUNITY)
Admission: RE | Admit: 2014-06-12 | Discharge: 2014-06-12 | Disposition: A | Discharge status: Home / Self Care | Payer: Medicare Other | Source: Ambulatory Visit | Attending: Neurology | Admitting: Neurology

## 2014-06-12 DIAGNOSIS — G35 Multiple sclerosis: Secondary | ICD-10-CM | POA: Diagnosis not present

## 2014-06-12 LAB — VITAMIN D 25 HYDROXY (VIT D DEFICIENCY, FRACTURES): Vit D, 25-Hydroxy: 31.8 ng/mL (ref 30.0–100.0)

## 2014-06-12 MED ORDER — SODIUM CHLORIDE 0.9 % IV SOLN
1000.0000 mg | Freq: Once | INTRAVENOUS | Status: AC
  Administered 2014-06-12: 1000 mg via INTRAVENOUS
  Filled 2014-06-12: qty 8

## 2014-06-12 MED ORDER — SODIUM CHLORIDE 0.9 % IV SOLN
INTRAVENOUS | Status: DC
  Administered 2014-06-12: 250 mL via INTRAVENOUS

## 2014-06-12 MED ORDER — METHYLPREDNISOLONE SODIUM SUCC 125 MG IJ SOLR: 1000.0000 mg | Freq: Once | INTRAMUSCULAR | Status: DC

## 2014-06-12 NOTE — Progress Notes (Signed)
Results for ELVERA, ALMARIO (MRN [id number]) as of 06/12/2014 07:43  Labs drawn 06/11/2014 prior to Solumedrol infusion  Ref. Range 12/14/2013 08:59 06/11/2014 11:55  Sodium Latest Range: 135-145 mmol/L  141  Potassium Latest Range: 3.5-5.1 mmol/L  3.7  Chloride Latest Range: 96-112 mmol/L  112  CO2 Latest Range: 19-32 mmol/L  22  BUN Latest Range: 6-23 mg/dL  10  Creatinine Latest Range: 0.50-1.10 mg/dL  0.73  Calcium Latest Range: 8.4-10.5 mg/dL  9.4  GFR calc non Af Amer Latest Range: >90 mL/min  >90  GFR calc Af Amer Latest Range: >90 mL/min  >90  Glucose Latest Range: 70-99 mg/dL  116 (H)  Anion gap Latest Range: 5-15   7  Alkaline Phosphatase Latest Range: 39-117 U/L  100  Albumin Latest Range: 3.5-5.2 g/dL  4.2  AST Latest Range: 0-37 U/L  23  ALT Latest Range: 0-35 U/L  20  Total Protein Latest Range: 6.0-8.3 g/dL  7.5  Total Bilirubin Latest Range: 0.3-1.2 mg/dL  0.5  Vit D, 25-Hydroxy Latest Range: 30.0-100.0 ng/mL  31.8  Vitamin B-12 Latest Range: 211-911 pg/mL  910  WBC Latest Range: 4.0-10.5 K/uL  5.0  RBC Latest Range: 3.87-5.11 MIL/uL  4.31  Hemoglobin Latest Range: 12.0-15.0 g/dL  13.9  HCT Latest Range: 36.0-46.0 %  40.8  MCV Latest Range: 78.0-100.0 fL  94.7  MCH Latest Range: 26.0-34.0 pg  32.3  MCHC Latest Range: 30.0-36.0 g/dL  34.1  RDW Latest Range: 11.5-15.5 %  13.1  Platelets Latest Range: 150-400 K/uL  213  Neutrophils Relative % Latest Range: 43-77 %  69  Lymphocytes Relative Latest Range: 12-46 %  16  Monocytes Relative Latest Range: 3-12 %  7  Eosinophils Relative Latest Range: 0-5 %  7 (H)  Basophils Relative Latest Range: 0-1 %  1  NEUT# Latest Range: 1.7-7.7 K/uL  3.4  Lymphocytes Absolute Latest Range: 0.7-4.0 K/uL  0.8  Monocytes Absolute Latest Range: 0.1-1.0 K/uL  0.4  Eosinophils Absolute Latest Range: 0.0-0.7 K/uL  0.4  Basophils Absolute Latest Range: 0.0-0.1 K/uL  0.1  TSH Latest Range: 0.350-4.500 uIU/mL  1.512  EKG No range found [place]

## 2014-06-13 ENCOUNTER — Ambulatory Visit (HOSPITAL_COMMUNITY)
Admission: RE | Admit: 2014-06-13 | Discharge: 2014-06-13 | Disposition: A | Discharge status: Home / Self Care | Payer: Medicare Other | Source: Ambulatory Visit | Attending: Neurology | Admitting: Neurology

## 2014-06-13 ENCOUNTER — Encounter (HOSPITAL_COMMUNITY): Payer: Self-pay

## 2014-06-13 ENCOUNTER — Encounter (HOSPITAL_COMMUNITY)
Admission: RE | Admit: 2014-06-13 | Discharge: 2014-06-13 | Disposition: A | Discharge status: Home / Self Care | Payer: Medicare Other | Source: Ambulatory Visit | Attending: Neurology | Admitting: Neurology

## 2014-06-13 DIAGNOSIS — R200 Anesthesia of skin: Secondary | ICD-10-CM | POA: Diagnosis not present

## 2014-06-13 DIAGNOSIS — R29898 Other symptoms and signs involving the musculoskeletal system: Secondary | ICD-10-CM | POA: Diagnosis not present

## 2014-06-13 DIAGNOSIS — G35 Multiple sclerosis: Secondary | ICD-10-CM

## 2014-06-13 DIAGNOSIS — M6281 Muscle weakness (generalized): Secondary | ICD-10-CM | POA: Diagnosis not present

## 2014-06-13 LAB — METHYLMALONIC ACID, SERUM: METHYLMALONIC ACID, QUANTITATIVE: 115 nmol/L (ref 87–318)

## 2014-06-13 MED ORDER — SODIUM CHLORIDE 0.9 % IV SOLN
1000.0000 mg | Freq: Once | INTRAVENOUS | Status: AC
  Administered 2014-06-13: 1000 mg via INTRAVENOUS
  Filled 2014-06-13: qty 8

## 2014-06-13 MED ORDER — SODIUM CHLORIDE 0.9 % IV SOLN
INTRAVENOUS | Status: DC
  Administered 2014-06-13: 250 mL via INTRAVENOUS

## 2014-06-13 MED ORDER — GADOBENATE DIMEGLUMINE 529 MG/ML IV SOLN
20.0000 mL | Freq: Once | INTRAVENOUS | Status: AC | PRN
  Administered 2014-06-13: 20 mL via INTRAVENOUS

## 2014-06-13 NOTE — Progress Notes (Signed)
Pt has completed her daily  solumedrol infusion x 3 days tolerated well. After the completion of today's infusion pt is scheduled for her brain MRI. Pt was sent to radiology after infusion.

## 2014-06-14 ENCOUNTER — Other Ambulatory Visit (INDEPENDENT_AMBULATORY_CARE_PROVIDER_SITE_OTHER): Payer: Self-pay | Admitting: Internal Medicine

## 2014-06-14 NOTE — Progress Notes (Signed)
Results for Natalie Campos, Natalie Campos (MRN [id number]) as of 06/14/2014 11:09  This is the remainder of the lab results received today.  Ref. Range 06/11/2014 11:55  Methylmalonic Acid, Quantitative Latest Range: 87-318 nmol/L 115  Vit D, 25-Hydroxy Latest Range: 30.0-100.0 ng/mL 31.8  Vitamin B-12 Latest Range: 211-911 pg/mL 910

## 2014-06-26 ENCOUNTER — Telehealth: Payer: Self-pay | Admitting: Family Medicine

## 2014-06-26 DIAGNOSIS — E785 Hyperlipidemia, unspecified: Secondary | ICD-10-CM

## 2014-06-26 DIAGNOSIS — R739 Hyperglycemia, unspecified: Secondary | ICD-10-CM

## 2014-06-26 NOTE — Telephone Encounter (Signed)
Last labs ordered by DR. Scott 3/27 cbc, TIBC, ferritin, lipid, bmp, tsh, free t4

## 2014-06-26 NOTE — Telephone Encounter (Signed)
Pt is needing blood work orders to be sent over for her appt. On 07/10/14. Last labs were vit d,vit d 1,vit b12,methylmelonic acid,tsh,cmet,cbc 06/11/14

## 2014-06-27 NOTE — Telephone Encounter (Signed)
BW orders are in. Pt notified.

## 2014-06-27 NOTE — Telephone Encounter (Signed)
Lipid, hemoglobin A1c-reason-hyperlipidemia, hyperglycemia

## 2014-07-06 ENCOUNTER — Other Ambulatory Visit: Payer: Self-pay | Admitting: Family Medicine

## 2014-07-06 DIAGNOSIS — R7309 Other abnormal glucose: Secondary | ICD-10-CM | POA: Diagnosis not present

## 2014-07-06 DIAGNOSIS — R739 Hyperglycemia, unspecified: Secondary | ICD-10-CM | POA: Diagnosis not present

## 2014-07-06 DIAGNOSIS — E785 Hyperlipidemia, unspecified: Secondary | ICD-10-CM | POA: Diagnosis not present

## 2014-07-06 LAB — LIPID PANEL
Cholesterol: 214 mg/dL — ABNORMAL HIGH (ref 0–200)
HDL: 71 mg/dL (ref >=46)
LDL Cholesterol: 106 mg/dL — ABNORMAL HIGH (ref 0–99)
Total CHOL/HDL Ratio: 3 Ratio
Triglycerides: 183 mg/dL — ABNORMAL HIGH (ref <150)
VLDL: 37 mg/dL (ref 0–40)

## 2014-07-06 LAB — HEMOGLOBIN A1C
HEMOGLOBIN A1C: 5.2 % (ref <5.7)
MEAN PLASMA GLUCOSE: 103 mg/dL (ref <117)

## 2014-07-10 ENCOUNTER — Ambulatory Visit: Payer: Medicare Other | Admitting: Family Medicine

## 2014-07-30 ENCOUNTER — Ambulatory Visit (INDEPENDENT_AMBULATORY_CARE_PROVIDER_SITE_OTHER): Payer: Medicare Other | Admitting: Family Medicine

## 2014-07-30 ENCOUNTER — Encounter: Payer: Self-pay | Admitting: Family Medicine

## 2014-07-30 VITALS — BP 128/90 | Ht 69.0 in | Wt 226.0 lb

## 2014-07-30 DIAGNOSIS — E785 Hyperlipidemia, unspecified: Secondary | ICD-10-CM | POA: Diagnosis not present

## 2014-07-30 DIAGNOSIS — R350 Frequency of micturition: Secondary | ICD-10-CM

## 2014-07-30 DIAGNOSIS — J3489 Other specified disorders of nose and nasal sinuses: Secondary | ICD-10-CM | POA: Diagnosis not present

## 2014-07-30 DIAGNOSIS — I10 Essential (primary) hypertension: Secondary | ICD-10-CM | POA: Diagnosis not present

## 2014-07-30 LAB — POCT URINALYSIS DIPSTICK
SPEC GRAV UA: 1.025
pH, UA: 5

## 2014-07-30 MED ORDER — TRIAMCINOLONE ACETONIDE 0.1 % EX CREA: 1.0000 "application " | TOPICAL_CREAM | Freq: Two times a day (BID) | CUTANEOUS | Status: DC

## 2014-07-30 NOTE — Progress Notes (Signed)
   Subjective:    Patient ID: Natalie Campos, female    DOB: 1960-12-02, 54 y.o.   MRN: [id number]  HPIADD check up.   Pt brought in blood pressure machine. 137/99 on pt's machine. Nurse's reading 128/90. She had a recent review by insurance agency who told her her blood pressure need to be followed closely.  Skin dry and  itching around breast area. Using eurcerin cream. She gets a rash on the breasts on the abdomen on her back on her arms dry skin itches intensely worse in the winter she is try humidifier but it scares her at times  Sinus symptoms. Sinus pressure, headache, runny nose. Started recently. She denies any infection. No mucoid drainage.  Blood pressure under decent control today. Takes her medicine tries to watch diet  Reflux under decent control takes her medicine    Review of Systems  Constitutional: Negative for activity change, appetite change and fatigue.  HENT: Negative for congestion.   Respiratory: Negative for cough.   Cardiovascular: Negative for chest pain.  Gastrointestinal: Negative for abdominal pain.  Endocrine: Negative for polydipsia and polyphagia.  Neurological: Negative for weakness.  Psychiatric/Behavioral: Negative for confusion.   Blood pressure was checked with our cuff and then her machine changed Her Machine to Large Cuff Got Better Reading    Objective:   Physical Exam  Constitutional: She appears well-nourished. No distress.  Cardiovascular: Normal rate, regular rhythm and normal heart sounds.   No murmur heard. Pulmonary/Chest: Effort normal and breath sounds normal. No respiratory distress.  Musculoskeletal: She exhibits no edema.  Lymphadenopathy:    She has no cervical adenopathy.  Neurological: She is alert. She exhibits normal muscle tone.  Psychiatric: Her behavior is normal.  Vitals reviewed.         Assessment & Plan:  HTN decent control blood pressure readings with our machine and large cuff look good  Eczema dry  skin lotion steroid cream twice a day when necessary  Viral URI persist may need antibiotics called in  25 minutes spent with patient greater than half in discussion of multiple health problems

## 2014-07-30 NOTE — Patient Instructions (Signed)
Try Loratadine 10 mg one daily for allergies ( generic of Claritin)  If worse over the next week then call may need an antibiotic

## 2014-08-30 DIAGNOSIS — R269 Unspecified abnormalities of gait and mobility: Secondary | ICD-10-CM | POA: Diagnosis not present

## 2014-08-30 DIAGNOSIS — G894 Chronic pain syndrome: Secondary | ICD-10-CM | POA: Diagnosis not present

## 2014-08-30 DIAGNOSIS — G35 Multiple sclerosis: Secondary | ICD-10-CM | POA: Diagnosis not present

## 2014-08-30 DIAGNOSIS — R5381 Other malaise: Secondary | ICD-10-CM | POA: Diagnosis not present

## 2014-09-10 ENCOUNTER — Ambulatory Visit (INDEPENDENT_AMBULATORY_CARE_PROVIDER_SITE_OTHER): Payer: Medicare Other | Admitting: Internal Medicine

## 2014-10-08 ENCOUNTER — Other Ambulatory Visit: Payer: Self-pay | Admitting: Family Medicine

## 2014-10-10 ENCOUNTER — Encounter (INDEPENDENT_AMBULATORY_CARE_PROVIDER_SITE_OTHER): Payer: Self-pay | Admitting: *Deleted

## 2014-11-28 DIAGNOSIS — Z79899 Other long term (current) drug therapy: Secondary | ICD-10-CM | POA: Diagnosis not present

## 2014-11-28 DIAGNOSIS — G894 Chronic pain syndrome: Secondary | ICD-10-CM | POA: Diagnosis not present

## 2014-11-28 DIAGNOSIS — R5381 Other malaise: Secondary | ICD-10-CM | POA: Diagnosis not present

## 2014-11-28 DIAGNOSIS — G35 Multiple sclerosis: Secondary | ICD-10-CM | POA: Diagnosis not present

## 2014-12-03 DIAGNOSIS — Z01419 Encounter for gynecological examination (general) (routine) without abnormal findings: Secondary | ICD-10-CM | POA: Diagnosis not present

## 2014-12-20 DIAGNOSIS — Z1231 Encounter for screening mammogram for malignant neoplasm of breast: Secondary | ICD-10-CM | POA: Diagnosis not present

## 2015-01-10 ENCOUNTER — Other Ambulatory Visit (INDEPENDENT_AMBULATORY_CARE_PROVIDER_SITE_OTHER): Payer: Self-pay | Admitting: Internal Medicine

## 2015-01-21 ENCOUNTER — Other Ambulatory Visit: Payer: Self-pay | Admitting: Family Medicine

## 2015-01-29 ENCOUNTER — Ambulatory Visit (INDEPENDENT_AMBULATORY_CARE_PROVIDER_SITE_OTHER): Payer: Medicare Other | Admitting: Family Medicine

## 2015-01-29 ENCOUNTER — Encounter: Payer: Self-pay | Admitting: Family Medicine

## 2015-01-29 VITALS — BP 120/82 | Ht 69.0 in | Wt 228.5 lb

## 2015-01-29 DIAGNOSIS — M1712 Unilateral primary osteoarthritis, left knee: Secondary | ICD-10-CM | POA: Diagnosis not present

## 2015-01-29 DIAGNOSIS — N312 Flaccid neuropathic bladder, not elsewhere classified: Secondary | ICD-10-CM

## 2015-01-29 DIAGNOSIS — I10 Essential (primary) hypertension: Secondary | ICD-10-CM | POA: Diagnosis not present

## 2015-01-29 DIAGNOSIS — M5440 Lumbago with sciatica, unspecified side: Secondary | ICD-10-CM

## 2015-01-29 DIAGNOSIS — E785 Hyperlipidemia, unspecified: Secondary | ICD-10-CM

## 2015-01-29 MED ORDER — AMLODIPINE BESYLATE 5 MG PO TABS: 5.0000 mg | ORAL_TABLET | Freq: Every day | ORAL | Status: DC

## 2015-01-29 NOTE — Progress Notes (Signed)
   Subjective:    Patient ID: Natalie Campos, female    DOB: Aug 05, 1960, 54 y.o.   MRN: [id number]  Hypertension This is a chronic problem. The current episode started more than 1 year ago. The problem has been gradually improving since onset. There are no associated agents to hypertension. There are no known risk factors for coronary artery disease. Treatments tried: hctz. The current treatment provides moderate improvement. There are no compliance problems.    Patient states that she is having left knee pain/swelling. This has been present for 1 week now.  Patient has MS states she is not had a flare up of this She states her moods are stable She also states that occasionally she has muscle spasms associated with her symptoms Also relates that her reflux under decent control Patient denies misusing or abusing her medications Patient relates because of the progression of her symptoms in her back as well as her left knee she is unable to do many things around the house has difficult time attending to the needs of her family Greater than 25 minutes spent with patient discussing multiple different issues   Review of Systems Patient relates difficult time emptying her bladder she states there are times where she leaks without even thinking about being but other times when she once to urinate she can she does have MS which can cause difficulty with this She also has low back pain with sciatica down the right leg she is had increased pain discomfort difficult time getting around this been going on for months despite conservative measures of stretching and anti-inflammatory  She also has significant left knee pain with left knee swelling and discomfort stiffness and pain    Objective:   Physical Exam  Neck no masses lungs are clear no crackles heart is regular pulse normal extremities no edema skin warm dry patient does have osteopenia arthritis of the left knee. Patient also has subjective  discomfort in her back with sciatica. Patient also relates that she has a very difficult time ambulating because of the pain in her back and her left knee.      Assessment & Plan:  Orthopedics referral for left knee osteoarthritis will need fluid drained off and injection possibly Severe low back pain with sciatica down the right leg persistent for several months progressively getting worse patient would like to seek opinion from Dr. Carloyn Manner Hypertension subpar control add amlodipine take away diuretic Probable atonic bladder referral to urology for further evaluation Depression stable continue current medications 25 minutes spent with patient greater than half in discussion of multiple issues

## 2015-02-05 ENCOUNTER — Encounter: Payer: Self-pay | Admitting: Family Medicine

## 2015-02-07 DIAGNOSIS — G35 Multiple sclerosis: Secondary | ICD-10-CM | POA: Diagnosis not present

## 2015-02-07 DIAGNOSIS — R5381 Other malaise: Secondary | ICD-10-CM | POA: Diagnosis not present

## 2015-02-11 DIAGNOSIS — M25562 Pain in left knee: Secondary | ICD-10-CM | POA: Diagnosis not present

## 2015-02-14 ENCOUNTER — Other Ambulatory Visit (INDEPENDENT_AMBULATORY_CARE_PROVIDER_SITE_OTHER): Payer: Self-pay | Admitting: Internal Medicine

## 2015-02-25 ENCOUNTER — Ambulatory Visit (INDEPENDENT_AMBULATORY_CARE_PROVIDER_SITE_OTHER): Payer: Medicare Other | Admitting: Internal Medicine

## 2015-02-26 ENCOUNTER — Ambulatory Visit (INDEPENDENT_AMBULATORY_CARE_PROVIDER_SITE_OTHER): Payer: Medicare Other | Admitting: Internal Medicine

## 2015-02-26 ENCOUNTER — Encounter (INDEPENDENT_AMBULATORY_CARE_PROVIDER_SITE_OTHER): Payer: Self-pay | Admitting: *Deleted

## 2015-02-26 ENCOUNTER — Encounter (INDEPENDENT_AMBULATORY_CARE_PROVIDER_SITE_OTHER): Payer: Self-pay | Admitting: Internal Medicine

## 2015-02-26 VITALS — BP 128/78 | HR 72 | Temp 98.7°F | Resp 18 | Ht 69.0 in | Wt 227.9 lb

## 2015-02-26 DIAGNOSIS — K219 Gastro-esophageal reflux disease without esophagitis: Secondary | ICD-10-CM | POA: Diagnosis not present

## 2015-02-26 DIAGNOSIS — R1310 Dysphagia, unspecified: Secondary | ICD-10-CM

## 2015-02-26 DIAGNOSIS — K2270 Barrett's esophagus without dysplasia: Secondary | ICD-10-CM | POA: Diagnosis not present

## 2015-02-26 DIAGNOSIS — R109 Unspecified abdominal pain: Secondary | ICD-10-CM

## 2015-02-26 DIAGNOSIS — K5900 Constipation, unspecified: Secondary | ICD-10-CM

## 2015-02-26 MED ORDER — DEXLANSOPRAZOLE 60 MG PO CPDR: 60.0000 mg | DELAYED_RELEASE_CAPSULE | Freq: Every day | ORAL | Status: DC

## 2015-02-26 NOTE — Progress Notes (Signed)
Presenting complaint;  Follow-up for GERD abdominal pain and constipation.  Subjective:  Patient is 54 year old Caucasian female who is here for scheduled visit. She was last seen in July 2015. She states she was doing well as for as her heartburn is concerned until she ran out of the prescription over week ago. Since then she's been having intractable heartburn and regurgitation. She has been using OTC omeprazole but it is not helping. She says pharmacist told her that we did not fill her prescription. However no request never came to her office. She is also complaining of dysphagia and points to suprasternal area and throat as site of bolus obstruction. She states food eventually goes down. She also complains of being constipated. She complains of sharp pain across her lower abdomen and she also complains of pain in left upper quadrant. She stop using polyethylene glycol because it causes diarrhea. She denies fever chills melena or rectal bleeding. She says she is not taking meloxicam anymore.  She also complains of urinary incontinence and planning to see urologist. She was using wedge pillow but started to have shoulder pain and therefore she is just using 2 pillows now. She denies nausea vomiting or hematemesis. Her weight has been stable since her last visit. She takes Dilaudid twice daily.    Current Medications: Outpatient Encounter Prescriptions as of 02/26/2015  Medication Sig  . amLODipine (NORVASC) 5 MG tablet Take 1 tablet (5 mg total) by mouth daily.  Marland Kitchen amphetamine-dextroamphetamine (ADDERALL) 10 MG tablet Take 10 mg by mouth 2 (two) times daily with a meal.  . aspirin (ASPIRIN LOW DOSE) 81 MG EC tablet Take 81 mg by mouth 2 (two) times daily. One tablet by mouth once daily  . baclofen (LIORESAL) 10 MG tablet Take 10 mg by mouth 3 (three) times daily.  . Cholecalciferol (VITAMIN D3) 3000 UNITS TABS Take 1 tablet by mouth daily.    Marland Kitchen dexlansoprazole (DEXILANT) 60 MG capsule  Take 1 capsule (60 mg total) by mouth daily.  . diazepam (VALIUM) 10 MG tablet Take 10 mg by mouth 3 (three) times daily as needed. One tablet by mouth 4 times daily  . docusate sodium (COLACE) 100 MG capsule Take 300 mg by mouth daily. One caplet by mouth  Two times a day  . DULoxetine (CYMBALTA) 30 MG capsule Take 30 mg by mouth daily.   . DULoxetine (CYMBALTA) 60 MG capsule Take 60 mg by mouth daily.  . ferrous sulfate (FERROUSUL) 325 (65 FE) MG tablet Take 1 tablet (325 mg total) by mouth 2 (two) times daily with a meal.  . fish oil-omega-3 fatty acids 1000 MG capsule Take by mouth daily. One cap by mouth daily     . gabapentin (NEURONTIN) 600 MG tablet Take 600 mg by mouth 3 (three) times daily.   Marland Kitchen HYDROmorphone (DILAUDID) 4 MG tablet Take 4 mg by mouth every 12 (twelve) hours as needed for severe pain.  Marland Kitchen ketoconazole (NIZORAL) 2 % cream Apply Bid prn for up to 7 days to affected area  . losartan (COZAAR) 100 MG tablet TAKE (1) TABLET BY MOUTH ONCE DAILY.  . multivitamin (THERAGRAN) per tablet Take 1 tablet by mouth daily. One tablet by mouth once daily   . nystatin-triamcinolone (MYCOLOG II) cream Apply 1 application topically 2 (two) times daily. Apply twice daily estrogen for one week and then as needed basis  . olopatadine (PATANOL) 0.1 % ophthalmic solution PLACE 1 DROP INTO BOTH EYES AS NEEDED.  Marland Kitchen omeprazole (PRILOSEC) 40  MG capsule TAKE 1 CAPSULE BY MOUTH ONCE A DAY.  Marland Kitchen polyethylene glycol powder (GLYCOLAX/MIRALAX) powder MIX 17 GRAMS IN 8 OZ OF LIQUID ONCE DAILY.  Marland Kitchen potassium chloride (K-DUR) 10 MEQ tablet Take 10 mEq by mouth daily.  . TECFIDERA 240 MG CPDR 240 mg 2 (two) times daily.   Marland Kitchen tiZANidine (ZANAFLEX) 2 MG tablet Take 2 mg by mouth at bedtime.   . topiramate (TOPAMAX) 100 MG tablet Take 100 mg by mouth daily.  . traMADol (ULTRAM) 50 MG tablet Take 100 mg by mouth 2 (two) times daily.   Marland Kitchen triamcinolone cream (KENALOG) 0.1 % Apply 1 application topically 2 (two) times  daily.  . vitamin E 1000 UNIT capsule Take 1,000 Units by mouth daily.  . [DISCONTINUED] meloxicam (MOBIC) 15 MG tablet Take 15 mg by mouth daily.    Facility-Administered Encounter Medications as of 02/26/2015  Medication  . Influenza (>/= 3 years) inactive virus vaccine (FLVIRIN/FLUZONE) injection SUSP 0.5 mL     Objective: Blood pressure 128/78, pulse 72, temperature 98.7 F (37.1 C), temperature source Oral, resp. rate 18, height 5\' 9"  (1.753 m), weight 227 lb 14.4 oz (103.375 kg). Patient is alert and in no acute distress. Conjunctiva is pink. Sclera is nonicteric Oropharyngeal mucosa is normal. No neck masses or thyromegaly noted. Cardiac exam with regular rhythm normal S1 and S2. No murmur or gallop noted. Lungs are clear to auscultation. Abdomen is full. Bowel sounds are normal. On palpation abdomen is soft with mild tenderness in hypogastric area as well as LUQ. No guarding or rebound noted. No organomegaly or masses. No LE edema or clubbing noted.    Assessment:  #1. GERD complicated by short segment Barrett's esophagus. Symptoms every lab soft therapy. Patient advised to call office immediately if she has any problems refilling her medication in future. #2. Dysphagia. Suspect she has oropharyngeal dysphagia given history of MS. Will first evaluated with barium study prior to referral. #3. Chronic abdominal pain with constipation felt to be due to IBS and may also have narcotic bowel syndrome. Abdominal exam is unremarkable. Doubt that she has diverticulitis. #4. Chronic constipation felt to be multifactorial. Last colonoscopy was in April 2015 and was normal other than external hemorrhoids and 2 small anal papillae.   Plan:  Dexilant 60 mg by mouth every morning. New prescription issued. Discontinue omeprazole. Meloxicam deleted from her list as she is not taking this medication. Patient advised to take polyethylene glycol daily but she can titrate the dose so that  bowels move daily or every other day. Barium pill esophagogram. She will call if abdominal pain gets worse or she develops fever. Office visit in 6 months.

## 2015-02-26 NOTE — Patient Instructions (Signed)
Remember to take polyethylene glycol or MiraLAX every day.  Patient will call with results of barium study and further recommendations.

## 2015-02-27 DIAGNOSIS — R269 Unspecified abnormalities of gait and mobility: Secondary | ICD-10-CM | POA: Diagnosis not present

## 2015-02-27 DIAGNOSIS — G894 Chronic pain syndrome: Secondary | ICD-10-CM | POA: Diagnosis not present

## 2015-02-27 DIAGNOSIS — G35 Multiple sclerosis: Secondary | ICD-10-CM | POA: Diagnosis not present

## 2015-02-27 DIAGNOSIS — Z79899 Other long term (current) drug therapy: Secondary | ICD-10-CM | POA: Diagnosis not present

## 2015-03-08 ENCOUNTER — Other Ambulatory Visit (INDEPENDENT_AMBULATORY_CARE_PROVIDER_SITE_OTHER): Payer: Self-pay | Admitting: Internal Medicine

## 2015-03-12 ENCOUNTER — Ambulatory Visit (INDEPENDENT_AMBULATORY_CARE_PROVIDER_SITE_OTHER): Payer: Medicare Other | Admitting: Family Medicine

## 2015-03-12 ENCOUNTER — Encounter: Payer: Self-pay | Admitting: Family Medicine

## 2015-03-12 VITALS — BP 138/86 | Ht 69.0 in | Wt 225.6 lb

## 2015-03-12 DIAGNOSIS — R5383 Other fatigue: Secondary | ICD-10-CM | POA: Diagnosis not present

## 2015-03-12 DIAGNOSIS — I10 Essential (primary) hypertension: Secondary | ICD-10-CM

## 2015-03-12 DIAGNOSIS — R635 Abnormal weight gain: Secondary | ICD-10-CM | POA: Diagnosis not present

## 2015-03-12 NOTE — Progress Notes (Signed)
   Subjective:    Patient ID: Natalie Campos, female    DOB: 06-27-1960, 54 y.o.   MRN: [id number]  Hypertension This is a chronic problem. The current episode started more than 1 year ago. Risk factors for coronary artery disease include post-menopausal state. Treatments tried: norvasc. There are no compliance problems.    Patient taking norvasc at night due to possible drowsiness.  Patient relates at times she feels very drowsy during the day she relates difficult time staying awake at times in addition to this husband states at times she seems drowsy.  Patient had sleep study in the past which showed mild central sleep apnea but not severe enough to warrant CPAP.  Patient has MS she is under the care of Dr.[place] Review of Systems See above. Patient denies falls denies injury. States she's been doing well with eating denies shortness of breath chest pressure    Objective:   Physical Exam Lungs clear hearts regular blood pressure recheck was in a good range extremities no edema skin warm dry neurologic grossly normal  25 minutes was spent with patient discussing both her blood pressure and drowsiness and medication regimen greater than half was spent in discussion Patient frustrated about fatigue and weight gain. We will check thyroid function and metabolic 7 I encouraged her to eat healthy and stay as physically active as possible.    Assessment & Plan:  Blood pressure decent control continue current measures patient's blood pressure was checked sitting and standing no orthostatic hypotension  Drowsiness-patient is not drowsy today but she is on multiple muscle relaxers as well as narcotic pain medication. Patient states she tries to take her medicine responsibly. I told the patient that he would be in her best interest to work with her neurologist to try to lessen the amount of muscle relaxers she is on to see if this would help reduce drowsiness during the day and potentially reduce  the risk of accidental overdose/death. I sympathize with her situation having MS and muscle dystonia but hopefully the amount or strength of medication could be safely reduced working with her neurologist in order to lessen her own risk. Her and her husband seem to understand this. They gave me permission to send a copy of this transcription to her neurologist.

## 2015-03-15 ENCOUNTER — Telehealth: Payer: Self-pay | Admitting: Family Medicine

## 2015-03-15 ENCOUNTER — Ambulatory Visit (HOSPITAL_COMMUNITY)
Admission: RE | Admit: 2015-03-15 | Discharge: 2015-03-15 | Disposition: A | Discharge status: Home / Self Care | Payer: Medicare Other | Source: Ambulatory Visit | Attending: Internal Medicine | Admitting: Internal Medicine

## 2015-03-15 ENCOUNTER — Other Ambulatory Visit: Payer: Self-pay | Admitting: Family Medicine

## 2015-03-15 DIAGNOSIS — K449 Diaphragmatic hernia without obstruction or gangrene: Secondary | ICD-10-CM | POA: Insufficient documentation

## 2015-03-15 DIAGNOSIS — K2270 Barrett's esophagus without dysplasia: Secondary | ICD-10-CM | POA: Insufficient documentation

## 2015-03-15 DIAGNOSIS — I10 Essential (primary) hypertension: Secondary | ICD-10-CM | POA: Diagnosis not present

## 2015-03-15 DIAGNOSIS — R1310 Dysphagia, unspecified: Secondary | ICD-10-CM | POA: Diagnosis not present

## 2015-03-15 DIAGNOSIS — R5383 Other fatigue: Secondary | ICD-10-CM | POA: Diagnosis not present

## 2015-03-15 DIAGNOSIS — R635 Abnormal weight gain: Secondary | ICD-10-CM | POA: Diagnosis not present

## 2015-03-15 NOTE — Telephone Encounter (Signed)
Put in folder at nurses station

## 2015-03-15 NOTE — Telephone Encounter (Signed)
done

## 2015-03-15 NOTE — Telephone Encounter (Signed)
Pt dropped off the information where she received her flu and pneumonia vaccine yesterday at the pharmacy. Pt would like for the information to be entered into her chart.

## 2015-03-16 LAB — BASIC METABOLIC PANEL
BUN/Creatinine Ratio: 14 (ref 9–23)
BUN: 11 mg/dL (ref 6–24)
CO2: 18 mmol/L (ref 18–29)
CREATININE: 0.77 mg/dL (ref 0.57–1.00)
Calcium: 9.8 mg/dL (ref 8.7–10.2)
Chloride: 106 mmol/L (ref 97–106)
GFR calc Af Amer: 100 mL/min/{1.73_m2} (ref >59)
GFR, EST NON AFRICAN AMERICAN: 87 mL/min/{1.73_m2} (ref >59)
Glucose: 99 mg/dL (ref 65–99)
Potassium: 3.9 mmol/L (ref 3.5–5.2)
Sodium: 145 mmol/L — ABNORMAL HIGH (ref 136–144)

## 2015-03-16 LAB — TSH: TSH: 1.63 u[IU]/mL (ref 0.450–4.500)

## 2015-03-18 DIAGNOSIS — M25562 Pain in left knee: Secondary | ICD-10-CM | POA: Diagnosis not present

## 2015-05-01 ENCOUNTER — Ambulatory Visit (INDEPENDENT_AMBULATORY_CARE_PROVIDER_SITE_OTHER): Payer: Medicare Other | Admitting: Urology

## 2015-05-01 DIAGNOSIS — N898 Other specified noninflammatory disorders of vagina: Secondary | ICD-10-CM | POA: Diagnosis not present

## 2015-05-01 DIAGNOSIS — N3281 Overactive bladder: Secondary | ICD-10-CM

## 2015-05-01 DIAGNOSIS — N312 Flaccid neuropathic bladder, not elsewhere classified: Secondary | ICD-10-CM | POA: Diagnosis not present

## 2015-05-29 DIAGNOSIS — G894 Chronic pain syndrome: Secondary | ICD-10-CM | POA: Diagnosis not present

## 2015-05-29 DIAGNOSIS — G35 Multiple sclerosis: Secondary | ICD-10-CM | POA: Diagnosis not present

## 2015-05-29 DIAGNOSIS — G249 Dystonia, unspecified: Secondary | ICD-10-CM | POA: Diagnosis not present

## 2015-05-29 DIAGNOSIS — Z79899 Other long term (current) drug therapy: Secondary | ICD-10-CM | POA: Diagnosis not present

## 2015-06-07 ENCOUNTER — Encounter (INDEPENDENT_AMBULATORY_CARE_PROVIDER_SITE_OTHER): Payer: Self-pay | Admitting: Internal Medicine

## 2015-06-26 ENCOUNTER — Ambulatory Visit: Payer: Medicare Other | Admitting: Urology

## 2015-07-19 ENCOUNTER — Other Ambulatory Visit (INDEPENDENT_AMBULATORY_CARE_PROVIDER_SITE_OTHER): Payer: Self-pay | Admitting: Internal Medicine

## 2015-07-29 DIAGNOSIS — G35 Multiple sclerosis: Secondary | ICD-10-CM | POA: Diagnosis not present

## 2015-07-29 DIAGNOSIS — G894 Chronic pain syndrome: Secondary | ICD-10-CM | POA: Diagnosis not present

## 2015-07-29 DIAGNOSIS — R5383 Other fatigue: Secondary | ICD-10-CM | POA: Diagnosis not present

## 2015-07-29 DIAGNOSIS — G249 Dystonia, unspecified: Secondary | ICD-10-CM | POA: Diagnosis not present

## 2015-07-29 DIAGNOSIS — R252 Cramp and spasm: Secondary | ICD-10-CM | POA: Diagnosis not present

## 2015-07-29 DIAGNOSIS — Z79899 Other long term (current) drug therapy: Secondary | ICD-10-CM | POA: Diagnosis not present

## 2015-09-11 ENCOUNTER — Ambulatory Visit (INDEPENDENT_AMBULATORY_CARE_PROVIDER_SITE_OTHER): Payer: Medicare Other | Admitting: Internal Medicine

## 2015-09-11 ENCOUNTER — Encounter (INDEPENDENT_AMBULATORY_CARE_PROVIDER_SITE_OTHER): Payer: Self-pay | Admitting: Internal Medicine

## 2015-09-11 VITALS — BP 104/62 | HR 84 | Temp 97.7°F | Ht 69.0 in | Wt 221.4 lb

## 2015-09-11 DIAGNOSIS — K219 Gastro-esophageal reflux disease without esophagitis: Secondary | ICD-10-CM

## 2015-09-11 DIAGNOSIS — R197 Diarrhea, unspecified: Secondary | ICD-10-CM | POA: Diagnosis not present

## 2015-09-11 DIAGNOSIS — Z79899 Other long term (current) drug therapy: Secondary | ICD-10-CM | POA: Diagnosis not present

## 2015-09-11 DIAGNOSIS — E538 Deficiency of other specified B group vitamins: Secondary | ICD-10-CM | POA: Diagnosis not present

## 2015-09-11 DIAGNOSIS — M818 Other osteoporosis without current pathological fracture: Secondary | ICD-10-CM | POA: Diagnosis not present

## 2015-09-11 DIAGNOSIS — R5383 Other fatigue: Secondary | ICD-10-CM | POA: Diagnosis not present

## 2015-09-11 DIAGNOSIS — E559 Vitamin D deficiency, unspecified: Secondary | ICD-10-CM | POA: Diagnosis not present

## 2015-09-11 NOTE — Patient Instructions (Addendum)
Stop the Colace. When loose stools resolved start Colace once a day Take Benefiber daily. Continue the Dexilant.  OV 6 months

## 2015-09-11 NOTE — Progress Notes (Signed)
Subjective:    Patient ID: Natalie Campos, female    DOB: 08-21-1960, 55 y.o.   MRN: [id number]  HPI Here today for f/u. She was last seen by Dr. Laural Golden in October of 2016. Hx of GERD and constipation.  Her GERD is controlled with Dexilant. She says she has gone from IBS to diarrhea. She tells me she has started to have diarrhea. She has had diarrhea for a couple of months. Sometimes it will go for 6 days and then start. She will eat and then have to go to the BR. She is not having any constipation at this time. She stopped the Miralax when the constipation started. She [place] having 2-3 stools a day. She has lost about 5 1/2 pounds since her office visit.  Appetite is okay. She says she does not eat much. No dysphagia.    03/15/2015 DG Esophagram:   IMPRESSION: 1. Moderate hiatal hernia. 2. Mild-to-moderate esophageal dysmotility, likely presbyesophagus.     08/25/2013 EGD & Colonoscopy  Indications: Patient is 55 year old Caucasian female with multiple medical problems who has chronic GERD complicated by short segment Barrett's esophagus who presents with iron deficiency anemia and heme-positive stool. Patient is on low-dose aspirin and also takes OTC Motrin 2 or 3 times a week. There is no history of melena or rectal bleeding.  EGD findings; Short segment Barrett's esophagus. Biopsy taken to rule out dysplasia. Moderate size sliding-type hernia. Previously identified ulcers have healed. Nonerosive antral gastritis possibly secondary to NSAID therapy(H. pylori serology negative in September 20120.  Colonoscopy findings; Normal colonoscopy except external hemorrhoids and 2 smaller anal papillae.   Esophageal biopsy reveals Barrett's without dysplasia Results reviewed with patient. H&H in 4 weeks Report to PCP        Review of Systems Past Medical History    Diagnosis Date  . Emphysema   . Eczema   . Depression   . Hyperlipidemia   . GERD (gastroesophageal reflux disease)   . IBS (irritable bowel syndrome)     contipation predominant   . DJD (degenerative joint disease)   . Hypertension   . Multiple sclerosis ([place]) 1990    Dx in 1990 most recent hospitalization for a flareis in 2011   . High triglycerides   . Obesity   . Dog bite(E906.0) july 29,2011  . Barrett's esophagus   . Anemia   . Iron deficiency anemia   . Bulging discs     Past Surgical History  Procedure Laterality Date  . Cholecystectomy  2010    Dr. Anthony Sar   . Wisdom tooth extraction    . Esophagogastroduodenoscopy  01/28/2011    Procedure: ESOPHAGOGASTRODUODENOSCOPY (EGD);  Surgeon: Rogene Houston, MD;  Location: [facility];  Service: Endoscopy;  Laterality: N/A;  1:00  . Biopsy  01/28/2011    Procedure: BIOPSY;  Surgeon: Rogene Houston, MD;  Location: [facility];  Service: Endoscopy;  Laterality: N/A;  . Colonoscopy with propofol N/A 08/25/2013    Procedure: COLONOSCOPY WITH PROPOFOL;  Surgeon: Rogene Houston, MD;  Location: [facility];  Service: Endoscopy;  Laterality: N/A;  in cecum at 0811 out at 0822 = 11 minutes  . Esophagogastroduodenoscopy (egd) with propofol N/A 08/25/2013    Procedure: ESOPHAGOGASTRODUODENOSCOPY (EGD) WITH PROPOFOL;  Surgeon: Rogene Houston, MD;  Location: [facility];  Service: Endoscopy;  Laterality: N/A;  . Biopsy N/A 08/25/2013    Procedure: BIOPSY;  Surgeon: Rogene Houston, MD;  Location: [facility];  Service: Endoscopy;  Laterality: N/A;    Allergies  Allergen Reactions  . Ace Inhibitors   . Penicillins     Current Outpatient Prescriptions on File Prior to Visit  Medication Sig Dispense Refill  . amLODipine (NORVASC) 5 MG tablet Take 1 tablet (5 mg total) by mouth daily. 30 tablet 11  . amphetamine-dextroamphetamine (ADDERALL) 10 MG tablet Take 10 mg by mouth 2 (two) times daily with a meal.    . aspirin (ASPIRIN LOW DOSE) 81 MG EC  tablet Take 81 mg by mouth 2 (two) times daily. One tablet by mouth once daily    . baclofen (LIORESAL) 10 MG tablet Take 10 mg by mouth 2 (two) times daily.     . Cholecalciferol (VITAMIN D3) 3000 UNITS TABS Take 1 tablet by mouth daily.      Marland Kitchen DEXILANT 60 MG capsule TAKE 1 CAPSULE BY MOUTH ONCE A DAY. 30 capsule 5  . diazepam (VALIUM) 10 MG tablet Take 10 mg by mouth 3 (three) times daily as needed. One tablet by mouth 4 times daily    . docusate sodium (COLACE) 100 MG capsule Take 300 mg by mouth daily. One caplet by mouth  Two times a day    . DULoxetine (CYMBALTA) 30 MG capsule Take 30 mg by mouth daily.     . ferrous sulfate (FERROUSUL) 325 (65 FE) MG tablet Take 1 tablet (325 mg total) by mouth 2 (two) times daily with a meal.  3  . fish oil-omega-3 fatty acids 1000 MG capsule Take by mouth daily. One cap by mouth daily       . gabapentin (NEURONTIN) 600 MG tablet Take 600 mg by mouth 3 (three) times daily.     Marland Kitchen HYDROmorphone (DILAUDID) 4 MG tablet Take 4 mg by mouth every 12 (twelve) hours as needed for severe pain.    Marland Kitchen ketoconazole (NIZORAL) 2 % cream Apply Bid prn for up to 7 days to affected area 60 g 4  . losartan (COZAAR) 100 MG tablet TAKE 1 TABLET BY MOUTH ONCE DAILY. PATIENT NEEDS TO BE SEEN. 30 tablet 5  . multivitamin (THERAGRAN) per tablet Take 1 tablet by mouth daily. One tablet by mouth once daily     . nystatin-triamcinolone (MYCOLOG II) cream Apply 1 application topically 2 (two) times daily. Apply twice daily estrogen for one week and then as needed basis 30 g 0  . olopatadine (PATANOL) 0.1 % ophthalmic solution PLACE 1 DROP INTO BOTH EYES AS NEEDED. 5 mL 0  . polyethylene glycol powder (GLYCOLAX/MIRALAX) powder MIX 1 CAPFUL IN 8 OZ. OF JUICE OR WATER AND DRINK ONCE DAILY FOR CONSTIPATION. 510 g 5  . potassium chloride (K-DUR) 10 MEQ tablet Take 10 mEq by mouth daily.    . TECFIDERA 240 MG CPDR 240 mg 2 (two) times daily.     Marland Kitchen topiramate (TOPAMAX) 100 MG tablet Take 100  mg by mouth daily.    . traMADol (ULTRAM) 50 MG tablet Take 100 mg by mouth 2 (two) times daily.     Marland Kitchen triamcinolone cream (KENALOG) 0.1 % Apply 1 application topically 2 (two) times daily. 240 g 4  . vitamin E 1000 UNIT capsule Take 1,000 Units by mouth daily.     Current Facility-Administered Medications on File Prior to Visit  Medication Dose Route Frequency Provider Last Rate Last Dose  . Influenza (>/= 3 years) inactive virus vaccine (FLVIRIN/FLUZONE) injection SUSP 0.5 mL  0.5 mL Intramuscular Once Alycia Rossetti, MD  Objective:   Physical ExamBlood pressure 104/62, pulse 84, temperature 97.7 F (36.5 C), height 5\' 9"  (1.753 m), weight 221 lb 6.4 oz (100.426 kg). Alert and oriented. Skin warm and dry. Oral mucosa is moist.   . Sclera anicteric, conjunctivae is pink. Thyroid not enlarged. No cervical lymphadenopathy. Lungs clear. Heart regular rate and rhythm.  Abdomen is soft. Bowel sounds are positive. No hepatomegaly. No abdominal masses felt. No tenderness.  No edema to lower extremities.         Assessment & Plan:    GERD complicated by short segment Barrett's esophagus. GERD controlled with Dexilant.    Diarrhea intermittent: Will start her on Benefiber. May take Imodium once a day. Stop the Colace for now till the diarrhea resolved. May take Colace once a day and gradually up to 2 a day once stools are firmer.

## 2015-09-23 ENCOUNTER — Other Ambulatory Visit: Payer: Self-pay | Admitting: Family Medicine

## 2015-10-15 ENCOUNTER — Ambulatory Visit (INDEPENDENT_AMBULATORY_CARE_PROVIDER_SITE_OTHER): Payer: Medicare Other | Admitting: Family Medicine

## 2015-10-15 ENCOUNTER — Encounter: Payer: Self-pay | Admitting: Family Medicine

## 2015-10-15 VITALS — BP 124/88 | Ht 69.0 in | Wt 211.0 lb

## 2015-10-15 DIAGNOSIS — Z139 Encounter for screening, unspecified: Secondary | ICD-10-CM

## 2015-10-15 DIAGNOSIS — J019 Acute sinusitis, unspecified: Secondary | ICD-10-CM

## 2015-10-15 DIAGNOSIS — I10 Essential (primary) hypertension: Secondary | ICD-10-CM

## 2015-10-15 MED ORDER — DOXYCYCLINE HYCLATE 100 MG PO CAPS: 100.0000 mg | ORAL_CAPSULE | Freq: Two times a day (BID) | ORAL | Status: DC

## 2015-10-15 NOTE — Progress Notes (Signed)
   Subjective:    Patient ID: Natalie Campos, female    DOB: 05-31-1960, 55 y.o.   MRN: [id number]  Hypertension This is a chronic problem. The current episode started more than 1 year ago. There are no compliance problems.    Patient has concerns of possible sinus infection. Onset Friday.  She denies any chest tightness pressure pain shortness breath nausea vomiting diarrhea. She does relate significant sinus pressure pain and discomfort in the maxillary sinuses  Review of Systems See above    Objective:   Physical Exam Lungs are clear hearts regular pulse normal mild sinus tenderness       Assessment & Plan:  Blood pressure good control continue current measures refills given  Sinusitis antibiotics prescribed warning signs discussed follow-up if problems  In this chronic pain followed by specialist  Patient states specialist did lab work she does not want additional lab work but does agree to a hepatitis C antibody screening she was going to sign a release so that recent blood work could be sent to [place] for review

## 2015-10-16 ENCOUNTER — Other Ambulatory Visit: Payer: Self-pay | Admitting: Family Medicine

## 2015-10-16 LAB — HEPATITIS C ANTIBODY: Hep C Virus Ab: <0.1 s/co ratio (ref 0.0–0.9)

## 2015-10-18 ENCOUNTER — Telehealth: Payer: Self-pay | Admitting: Family Medicine

## 2015-10-18 MED ORDER — LOSARTAN POTASSIUM 100 MG PO TABS: ORAL_TABLET | ORAL | Status: DC

## 2015-10-18 NOTE — Telephone Encounter (Signed)
Pt was seen for a med check the other day and is needing a refill on her losartan (COZAAR) 100 MG tablet.      Natalie Campos

## 2015-10-18 NOTE — Telephone Encounter (Signed)
Notified patient refill sent to pharmacy.  

## 2015-10-23 ENCOUNTER — Other Ambulatory Visit: Payer: Self-pay | Admitting: *Deleted

## 2015-10-23 ENCOUNTER — Telehealth: Payer: Self-pay | Admitting: Family Medicine

## 2015-10-23 DIAGNOSIS — Z79899 Other long term (current) drug therapy: Secondary | ICD-10-CM | POA: Diagnosis not present

## 2015-10-23 DIAGNOSIS — R269 Unspecified abnormalities of gait and mobility: Secondary | ICD-10-CM | POA: Diagnosis not present

## 2015-10-23 DIAGNOSIS — R5381 Other malaise: Secondary | ICD-10-CM | POA: Diagnosis not present

## 2015-10-23 DIAGNOSIS — G35 Multiple sclerosis: Secondary | ICD-10-CM | POA: Diagnosis not present

## 2015-10-23 DIAGNOSIS — G894 Chronic pain syndrome: Secondary | ICD-10-CM | POA: Diagnosis not present

## 2015-10-23 MED ORDER — OLOPATADINE HCL 0.1 % OP SOLN: OPHTHALMIC | Status: DC

## 2015-10-23 NOTE — Telephone Encounter (Signed)
Pt wants to know if she can get some patanol eye drops for her eye  Allergies   [facility] she would like it with refills please

## 2015-10-23 NOTE — Telephone Encounter (Signed)
Ok per dr Richardson Landry. Refills sent to pharm. Pt notified on voicemail.

## 2015-10-24 ENCOUNTER — Telehealth: Payer: Self-pay | Admitting: Family Medicine

## 2015-10-24 DIAGNOSIS — D229 Melanocytic nevi, unspecified: Secondary | ICD-10-CM

## 2015-10-24 NOTE — Telephone Encounter (Signed)
Ok lets do 

## 2015-10-24 NOTE — Telephone Encounter (Signed)
Referral put in. Pt notified.  

## 2015-10-24 NOTE — Telephone Encounter (Signed)
Pt aware Dr is not in the office

## 2015-10-24 NOTE — Telephone Encounter (Signed)
Pt states that she showed you a mole on her right leg  About a year ago, she states that Dr Merlene Laughter saw it yesterday And advised her to go to dermatologist to have it accessed.   Referral to Dr Nevada Crane if we can   Please advise

## 2015-10-28 ENCOUNTER — Encounter: Payer: Self-pay | Admitting: Family Medicine

## 2015-11-21 DIAGNOSIS — L570 Actinic keratosis: Secondary | ICD-10-CM | POA: Diagnosis not present

## 2015-11-21 DIAGNOSIS — L308 Other specified dermatitis: Secondary | ICD-10-CM | POA: Diagnosis not present

## 2015-11-21 DIAGNOSIS — X32XXXA Exposure to sunlight, initial encounter: Secondary | ICD-10-CM | POA: Diagnosis not present

## 2015-11-21 DIAGNOSIS — L820 Inflamed seborrheic keratosis: Secondary | ICD-10-CM | POA: Diagnosis not present

## 2015-12-11 ENCOUNTER — Encounter: Payer: Self-pay | Admitting: Family Medicine

## 2015-12-31 DIAGNOSIS — Z1231 Encounter for screening mammogram for malignant neoplasm of breast: Secondary | ICD-10-CM | POA: Diagnosis not present

## 2016-01-13 ENCOUNTER — Other Ambulatory Visit (INDEPENDENT_AMBULATORY_CARE_PROVIDER_SITE_OTHER): Payer: Self-pay | Admitting: Internal Medicine

## 2016-01-14 ENCOUNTER — Other Ambulatory Visit: Payer: Self-pay | Admitting: Neurology

## 2016-01-14 DIAGNOSIS — R5381 Other malaise: Secondary | ICD-10-CM | POA: Diagnosis not present

## 2016-01-14 DIAGNOSIS — Z79899 Other long term (current) drug therapy: Secondary | ICD-10-CM | POA: Diagnosis not present

## 2016-01-14 DIAGNOSIS — G894 Chronic pain syndrome: Secondary | ICD-10-CM | POA: Diagnosis not present

## 2016-01-14 DIAGNOSIS — G35 Multiple sclerosis: Secondary | ICD-10-CM | POA: Diagnosis not present

## 2016-01-30 DIAGNOSIS — Z79899 Other long term (current) drug therapy: Secondary | ICD-10-CM | POA: Diagnosis not present

## 2016-01-30 DIAGNOSIS — G35 Multiple sclerosis: Secondary | ICD-10-CM | POA: Diagnosis not present

## 2016-02-03 ENCOUNTER — Ambulatory Visit (HOSPITAL_COMMUNITY)
Admission: RE | Admit: 2016-02-03 | Discharge: 2016-02-03 | Disposition: A | Discharge status: Home / Self Care | Payer: Medicare Other | Source: Ambulatory Visit | Attending: Neurology | Admitting: Neurology

## 2016-02-03 DIAGNOSIS — R410 Disorientation, unspecified: Secondary | ICD-10-CM | POA: Diagnosis not present

## 2016-02-03 DIAGNOSIS — G35 Multiple sclerosis: Secondary | ICD-10-CM | POA: Diagnosis not present

## 2016-02-03 MED ORDER — GADOBENATE DIMEGLUMINE 529 MG/ML IV SOLN
20.0000 mL | Freq: Once | INTRAVENOUS | Status: AC | PRN
  Administered 2016-02-03: 20 mL via INTRAVENOUS

## 2016-02-09 ENCOUNTER — Other Ambulatory Visit: Payer: Self-pay | Admitting: Family Medicine

## 2016-03-16 ENCOUNTER — Ambulatory Visit (INDEPENDENT_AMBULATORY_CARE_PROVIDER_SITE_OTHER): Payer: Medicare Other | Admitting: Internal Medicine

## 2016-03-16 ENCOUNTER — Ambulatory Visit: Payer: Medicare Other | Admitting: Family Medicine

## 2016-03-23 ENCOUNTER — Ambulatory Visit (INDEPENDENT_AMBULATORY_CARE_PROVIDER_SITE_OTHER): Payer: Medicare Other | Admitting: Internal Medicine

## 2016-03-23 ENCOUNTER — Encounter (INDEPENDENT_AMBULATORY_CARE_PROVIDER_SITE_OTHER): Payer: Self-pay | Admitting: Internal Medicine

## 2016-03-23 VITALS — BP 118/72 | HR 72 | Temp 99.0°F | Ht 69.0 in | Wt 213.4 lb

## 2016-03-23 DIAGNOSIS — K219 Gastro-esophageal reflux disease without esophagitis: Secondary | ICD-10-CM

## 2016-03-23 DIAGNOSIS — K2270 Barrett's esophagus without dysplasia: Secondary | ICD-10-CM

## 2016-03-23 DIAGNOSIS — K581 Irritable bowel syndrome with constipation: Secondary | ICD-10-CM

## 2016-03-23 NOTE — Patient Instructions (Signed)
OV 6 months with Dr. Laural Golden. Continue present medications.

## 2016-03-23 NOTE — Progress Notes (Signed)
   Subjective:    Patient ID: Natalie Campos, female    DOB: [birth year], 55 y.o.   MRN: [id number]  HPI  Here today for f/u. She was last seen in May by me. Marland Kitchen Hx of GERD.  Hx of GERD complicated by Short segment Barrett's esophagus.  He last colonoscopy was in 2015.  Hx of  dysphagia and underwent a DG Esophagus 03/15/2015 which revealed moderate hiatal hernia. Mild to moderate esophageal dysmotility, likely presbyesophagus.  She tells me her appetite is okay. She snacks during the day. Last weight in May was 221. Today her weight is 213.4lb She usually has a BM about every 2-3 days. Take Dilaudid daily. Take Miralax daily and stool softeners. No melena or BRRB Her acid reflux is controlled with Dexilant.  No NSAIDS except ASA 81mg  x 2 daily.        08/25/2013 EGD & Colonoscopy  Indications: Patient is 55 year old Caucasian female with multiple medical problems who has chronic GERD complicated by short segment Barrett's esophagus who presents with iron deficiency anemia and heme-positive stool. Patient is on low-dose aspirin and also takes OTC Motrin 2 or 3 times a week. There is no history of melena or rectal bleeding.  EGD findings; Short segment Barrett's esophagus. Biopsy taken to rule out dysplasia. Moderate size sliding-type hernia. Previously identified ulcers have healed. Nonerosive antral gastritis possibly secondary to NSAID therapy(H. pylori serology negative in September 20120.  Colonoscopy findings; Normal colonoscopy except external hemorrhoids and 2 smaller anal papillae.   Esophageal biopsy reveals Barrett's without dysplasia Results reviewed with patient.   Review of Systems     Objective:   Physical Exam Blood pressure 118/72, pulse 72, temperature 99 F (37.2 C), height 5\' 9"  (1.753 m), weight 213 lb 6.4 oz (96.8 kg). Alert and  oriented. Skin warm and dry. Oral mucosa is moist.   . Sclera anicteric, conjunctivae is pink. Thyroid not enlarged. No cervical lymphadenopathy. Lungs clear. Heart regular rate and rhythm.  Abdomen is soft. Bowel sounds are positive. No hepatomegaly. No abdominal masses felt. No tenderness.  No edema to lower extremities. Patient is alert and oriented.        Assessment & Plan:  GERD: complicated by short segment Barrett's esophagus. Acid reflux controlled with Dexilant.  Chronic abdominal pain with constipation felt to be due to IBS- constipation.  Continue the Stool softener and Miralax.

## 2016-04-08 DIAGNOSIS — Z79891 Long term (current) use of opiate analgesic: Secondary | ICD-10-CM | POA: Diagnosis not present

## 2016-04-08 DIAGNOSIS — G35 Multiple sclerosis: Secondary | ICD-10-CM | POA: Diagnosis not present

## 2016-04-08 DIAGNOSIS — R269 Unspecified abnormalities of gait and mobility: Secondary | ICD-10-CM | POA: Diagnosis not present

## 2016-04-08 DIAGNOSIS — G894 Chronic pain syndrome: Secondary | ICD-10-CM | POA: Diagnosis not present

## 2016-04-08 DIAGNOSIS — R5381 Other malaise: Secondary | ICD-10-CM | POA: Diagnosis not present

## 2016-04-08 DIAGNOSIS — Z79899 Other long term (current) drug therapy: Secondary | ICD-10-CM | POA: Diagnosis not present

## 2016-04-09 DIAGNOSIS — H4720 Unspecified optic atrophy: Secondary | ICD-10-CM | POA: Diagnosis not present

## 2016-04-09 DIAGNOSIS — H469 Unspecified optic neuritis: Secondary | ICD-10-CM | POA: Diagnosis not present

## 2016-04-15 ENCOUNTER — Ambulatory Visit: Payer: Medicare Other | Admitting: Family Medicine

## 2016-05-26 DIAGNOSIS — M546 Pain in thoracic spine: Secondary | ICD-10-CM | POA: Diagnosis not present

## 2016-05-26 DIAGNOSIS — M5126 Other intervertebral disc displacement, lumbar region: Secondary | ICD-10-CM | POA: Diagnosis not present

## 2016-05-26 DIAGNOSIS — M549 Dorsalgia, unspecified: Secondary | ICD-10-CM | POA: Diagnosis not present

## 2016-05-26 DIAGNOSIS — M47816 Spondylosis without myelopathy or radiculopathy, lumbar region: Secondary | ICD-10-CM | POA: Diagnosis not present

## 2016-05-26 DIAGNOSIS — M419 Scoliosis, unspecified: Secondary | ICD-10-CM | POA: Diagnosis not present

## 2016-06-01 ENCOUNTER — Other Ambulatory Visit: Payer: Self-pay | Admitting: Family Medicine

## 2016-06-04 DIAGNOSIS — M5440 Lumbago with sciatica, unspecified side: Secondary | ICD-10-CM | POA: Diagnosis not present

## 2016-06-04 DIAGNOSIS — M48061 Spinal stenosis, lumbar region without neurogenic claudication: Secondary | ICD-10-CM | POA: Diagnosis not present

## 2016-06-08 ENCOUNTER — Other Ambulatory Visit: Payer: Self-pay | Admitting: Family Medicine

## 2016-06-08 DIAGNOSIS — M47816 Spondylosis without myelopathy or radiculopathy, lumbar region: Secondary | ICD-10-CM | POA: Diagnosis not present

## 2016-06-08 DIAGNOSIS — M5440 Lumbago with sciatica, unspecified side: Secondary | ICD-10-CM | POA: Diagnosis not present

## 2016-06-08 DIAGNOSIS — M48062 Spinal stenosis, lumbar region with neurogenic claudication: Secondary | ICD-10-CM | POA: Diagnosis not present

## 2016-06-08 DIAGNOSIS — M5126 Other intervertebral disc displacement, lumbar region: Secondary | ICD-10-CM | POA: Diagnosis not present

## 2016-06-08 DIAGNOSIS — M5136 Other intervertebral disc degeneration, lumbar region: Secondary | ICD-10-CM | POA: Diagnosis not present

## 2016-06-15 ENCOUNTER — Telehealth (INDEPENDENT_AMBULATORY_CARE_PROVIDER_SITE_OTHER): Payer: Self-pay | Admitting: Internal Medicine

## 2016-06-15 NOTE — Telephone Encounter (Signed)
Patient called, stated that the price of Dexilant has gone up.  She'd like to see if there is something else Dr. Laural Golden can prescribe.  [phone]

## 2016-06-16 NOTE — Telephone Encounter (Signed)
I apologize, I forgot to include, if he can prescribe something else, she'd like it called to [place].

## 2016-06-17 NOTE — Telephone Encounter (Signed)
Patient will need to see what PPI's her Insurance will cover , let us know and Dr.Rehman will make a decision on what is best for her.

## 2016-06-18 DIAGNOSIS — M48061 Spinal stenosis, lumbar region without neurogenic claudication: Secondary | ICD-10-CM | POA: Diagnosis not present

## 2016-06-18 DIAGNOSIS — M5136 Other intervertebral disc degeneration, lumbar region: Secondary | ICD-10-CM | POA: Diagnosis not present

## 2016-06-18 DIAGNOSIS — M4726 Other spondylosis with radiculopathy, lumbar region: Secondary | ICD-10-CM | POA: Diagnosis not present

## 2016-06-18 NOTE — Telephone Encounter (Signed)
Patient was informed.

## 2016-07-13 ENCOUNTER — Ambulatory Visit (INDEPENDENT_AMBULATORY_CARE_PROVIDER_SITE_OTHER): Payer: Medicare Other | Admitting: Family Medicine

## 2016-07-13 ENCOUNTER — Encounter: Payer: Self-pay | Admitting: Family Medicine

## 2016-07-13 VITALS — BP 122/86 | Ht 69.0 in | Wt 216.2 lb

## 2016-07-13 DIAGNOSIS — J00 Acute nasopharyngitis [common cold]: Secondary | ICD-10-CM | POA: Diagnosis not present

## 2016-07-13 DIAGNOSIS — R5383 Other fatigue: Secondary | ICD-10-CM

## 2016-07-13 DIAGNOSIS — I10 Essential (primary) hypertension: Secondary | ICD-10-CM

## 2016-07-13 MED ORDER — FLUCONAZOLE 150 MG PO TABS: 150.0000 mg | ORAL_TABLET | Freq: Once | ORAL | 4 refills | Status: AC

## 2016-07-13 MED ORDER — DOXYCYCLINE HYCLATE 100 MG PO TABS: 100.0000 mg | ORAL_TABLET | Freq: Two times a day (BID) | ORAL | 0 refills | Status: DC

## 2016-07-13 NOTE — Progress Notes (Signed)
   Subjective:    Patient ID: Natalie Campos, female    DOB: [birth year], 58 y.o.   MRN: [id number]  Sinusitis  This is a new problem. The current episode started 1 to 4 weeks ago. The problem is unchanged. There has been no fever. Associated symptoms include ear pain, headaches and neck pain. (Runny nose, dizziness) Past treatments include oral decongestants. The treatment provided no relief.  Vaginal Itching  This is a new problem. The current episode started 1 to 4 weeks ago. The problem occurs intermittently. The problem has been unchanged. The pain is moderate. Associated symptoms include headaches and rash. Associated symptoms comments: Vaginal burning . Nothing aggravates the symptoms. She has tried nothing for the symptoms. The treatment provided no relief.  Patient has not been feeling good over the past couple weeks she had an injection in her back for a nerve impingement is seen to help a little bit for that but since then with headache some body aches no fevers no vomiting no diarrhea no wheezing or difficulty breathing they've been checking her blood pressures sometimes stop sometimes it's   Review of Systems  HENT: Positive for ear pain.   Musculoskeletal: Positive for neck pain.  Skin: Positive for rash.  Neurological: Positive for headaches.       Objective:   Physical Exam Eardrums normal throat is normal neck no masses lungs are clear hearts regular blood pressure was checked several times best reading 134/90 her machine read 140/93  Mild sinus tenderness to percussion     Assessment & Plan:  Possible sinusitis antibiotics prescribed warning signs discussed follow-up problems Probable yeast infection Diflucan as directed Headaches possibly associated with injection on her back we will get what procedure was done from Dr. Sherwood Gambler him review it Fatigue tiredness body aches could well be related to her MS will check some baseline lab work at the patient follow-up within 2  weeks

## 2016-07-15 DIAGNOSIS — I10 Essential (primary) hypertension: Secondary | ICD-10-CM | POA: Diagnosis not present

## 2016-07-15 DIAGNOSIS — R5383 Other fatigue: Secondary | ICD-10-CM | POA: Diagnosis not present

## 2016-07-16 LAB — CBC WITH DIFFERENTIAL/PLATELET
BASOS: 1 %
Basophils Absolute: 0 10*3/uL (ref 0.0–0.2)
EOS (ABSOLUTE): 0.3 10*3/uL (ref 0.0–0.4)
EOS: 8 %
HEMATOCRIT: 47.3 % — AB (ref 34.0–46.6)
HEMOGLOBIN: 16 g/dL — AB (ref 11.1–15.9)
Immature Grans (Abs): 0 10*3/uL (ref 0.0–0.1)
Immature Granulocytes: 0 %
LYMPHS ABS: 0.9 10*3/uL (ref 0.7–3.1)
Lymphs: 20 %
MCH: 32.6 pg (ref 26.6–33.0)
MCHC: 33.8 g/dL (ref 31.5–35.7)
MCV: 96 fL (ref 79–97)
MONOCYTES: 7 %
Monocytes Absolute: 0.3 10*3/uL (ref 0.1–0.9)
NEUTROS ABS: 2.8 10*3/uL (ref 1.4–7.0)
Neutrophils: 64 %
Platelets: 205 10*3/uL (ref 150–379)
RBC: 4.91 x10E6/uL (ref 3.77–5.28)
RDW: 13.2 % (ref 12.3–15.4)
WBC: 4.4 10*3/uL (ref 3.4–10.8)

## 2016-07-16 LAB — HEPATIC FUNCTION PANEL
ALK PHOS: 100 IU/L (ref 39–117)
ALT: 10 IU/L (ref 0–32)
AST: 16 IU/L (ref 0–40)
Albumin: 5 g/dL (ref 3.5–5.5)
BILIRUBIN, DIRECT: 0.16 mg/dL (ref 0.00–0.40)
Bilirubin Total: 0.6 mg/dL (ref 0.0–1.2)
Total Protein: 7.9 g/dL (ref 6.0–8.5)

## 2016-07-16 LAB — BASIC METABOLIC PANEL
BUN / CREAT RATIO: 14 (ref 9–23)
BUN: 11 mg/dL (ref 6–24)
CO2: 23 mmol/L (ref 18–29)
CREATININE: 0.78 mg/dL (ref 0.57–1.00)
Calcium: 10.1 mg/dL (ref 8.7–10.2)
Chloride: 104 mmol/L (ref 96–106)
GFR calc Af Amer: 98 mL/min/{1.73_m2} (ref >59)
GFR, EST NON AFRICAN AMERICAN: 85 mL/min/{1.73_m2} (ref >59)
Glucose: 103 mg/dL — ABNORMAL HIGH (ref 65–99)
Potassium: 4.2 mmol/L (ref 3.5–5.2)
SODIUM: 143 mmol/L (ref 134–144)

## 2016-07-16 LAB — TSH: TSH: 3.32 u[IU]/mL (ref 0.450–4.500)

## 2016-07-21 DIAGNOSIS — G894 Chronic pain syndrome: Secondary | ICD-10-CM | POA: Diagnosis not present

## 2016-07-21 DIAGNOSIS — G35 Multiple sclerosis: Secondary | ICD-10-CM | POA: Diagnosis not present

## 2016-07-21 DIAGNOSIS — Z79899 Other long term (current) drug therapy: Secondary | ICD-10-CM | POA: Diagnosis not present

## 2016-07-21 DIAGNOSIS — R5381 Other malaise: Secondary | ICD-10-CM | POA: Diagnosis not present

## 2016-07-21 DIAGNOSIS — R269 Unspecified abnormalities of gait and mobility: Secondary | ICD-10-CM | POA: Diagnosis not present

## 2016-07-28 ENCOUNTER — Ambulatory Visit: Payer: Medicare Other | Admitting: Family Medicine

## 2016-07-28 ENCOUNTER — Telehealth: Payer: Self-pay | Admitting: Family Medicine

## 2016-07-28 NOTE — Telephone Encounter (Signed)
Patient had an appointment today for a 2 week follow up on her blood pressure.  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  There is nothing but same day slots open the remainder of the week.  Can we work her in a same day slot?  She said her blood pressure is doing good.

## 2016-07-28 NOTE — Telephone Encounter (Signed)
Patient advised she is doing well and her blood pressure is running well. Patient scheduled follow up office visit in 3 weeks.

## 2016-07-28 NOTE — Telephone Encounter (Signed)
If she says her blood pressure is good and if she overall is feeling good I don't have a problem with rescheduling her to follow-up in 3 weeks time sooner if any other issues

## 2016-08-04 DIAGNOSIS — M5126 Other intervertebral disc displacement, lumbar region: Secondary | ICD-10-CM | POA: Diagnosis not present

## 2016-08-04 DIAGNOSIS — M48062 Spinal stenosis, lumbar region with neurogenic claudication: Secondary | ICD-10-CM | POA: Diagnosis not present

## 2016-08-04 DIAGNOSIS — M419 Scoliosis, unspecified: Secondary | ICD-10-CM | POA: Diagnosis not present

## 2016-08-04 DIAGNOSIS — M47816 Spondylosis without myelopathy or radiculopathy, lumbar region: Secondary | ICD-10-CM | POA: Diagnosis not present

## 2016-08-04 DIAGNOSIS — M5136 Other intervertebral disc degeneration, lumbar region: Secondary | ICD-10-CM | POA: Diagnosis not present

## 2016-08-05 ENCOUNTER — Telehealth (INDEPENDENT_AMBULATORY_CARE_PROVIDER_SITE_OTHER): Payer: Self-pay | Admitting: Internal Medicine

## 2016-08-05 NOTE — Telephone Encounter (Signed)
Patient called, stated that Dr. Laural Golden told her to look up a prescription that's on her formulary list and he'd call it in for her.  Omeprazole comes in 10mg , 20mg  and 40mg .  This is in her tier.  She'd like this sent to [facility] in [place].   [phone]

## 2016-08-06 ENCOUNTER — Other Ambulatory Visit (INDEPENDENT_AMBULATORY_CARE_PROVIDER_SITE_OTHER): Payer: Self-pay | Admitting: *Deleted

## 2016-08-06 MED ORDER — OMEPRAZOLE 40 MG PO CPDR: 40.0000 mg | DELAYED_RELEASE_CAPSULE | Freq: Every day | ORAL | 3 refills | Status: DC

## 2016-08-06 NOTE — Telephone Encounter (Signed)
Per Dr.Rehman - A rx for Omeprazole 40 mg - take 1 by mouth daily #90 with 3 refills has been e-scribed to the patient's pharmacy.

## 2016-08-10 ENCOUNTER — Other Ambulatory Visit: Payer: Self-pay | Admitting: Family Medicine

## 2016-08-18 ENCOUNTER — Encounter: Payer: Self-pay | Admitting: Family Medicine

## 2016-08-18 ENCOUNTER — Ambulatory Visit (INDEPENDENT_AMBULATORY_CARE_PROVIDER_SITE_OTHER): Payer: Medicare Other | Admitting: Family Medicine

## 2016-08-18 VITALS — BP 130/86 | Ht 69.0 in | Wt 214.5 lb

## 2016-08-18 DIAGNOSIS — I10 Essential (primary) hypertension: Secondary | ICD-10-CM | POA: Diagnosis not present

## 2016-08-18 NOTE — Progress Notes (Signed)
   Subjective:    Patient ID: Natalie Campos, female    DOB: 1961/05/10, 56 y.o.   MRN: [id number]  Hypertension  This is a chronic problem. The current episode started more than 1 year ago. Pertinent negatives include no chest pain, headaches or shortness of breath. There are no compliance problems.   Patient comes in today more worried about her husband them worried about herself she does have MS she is followed by specialist she is been having symptoms that wax and wane. She denies any high fever but she does not have a lot of weakness in discomforts and pain  Patient in today for 1 month recheck on hypertension.  Review of Systems  Constitutional: Negative for activity change, fatigue and fever.  Respiratory: Negative for cough and shortness of breath.   Cardiovascular: Negative for chest pain and leg swelling.  Neurological: Negative for headaches.       Objective:   Physical Exam  Constitutional: She appears well-nourished. No distress.  Cardiovascular: Normal rate, regular rhythm and normal heart sounds.   No murmur heard. Pulmonary/Chest: Effort normal and breath sounds normal. No respiratory distress.  Musculoskeletal: She exhibits no edema.  Lymphadenopathy:    She has no cervical adenopathy.  Neurological: She is alert. She exhibits normal muscle tone.  Psychiatric: Her behavior is normal.  Vitals reviewed.  I have advised her to make sure her husband does his lab work in 3 months with follow-up office visit       Assessment & Plan:  HTN- Patient was seen today as part of a visit regarding hypertension. The importance of healthy diet and regular physical activity was discussed. The importance of compliance with medications discussed. Ideal goal is to keep blood pressure low elevated levels certainly below 579/72 when possible. The patient was counseled that keeping blood pressure under control lessen his risk of heart attack, stroke, kidney failure, and early death. The  importance of regular follow-ups was discussed with the patient. Low-salt diet such as DASH recommended. Regular physical activity was recommended as well. Patient was advised to keep regular follow-ups.  Her blood pressure overall good control continue current measures encourage patient to watch diet difficult for her to lose weight follow-up again in 6 months

## 2016-09-07 ENCOUNTER — Other Ambulatory Visit (INDEPENDENT_AMBULATORY_CARE_PROVIDER_SITE_OTHER): Payer: Self-pay | Admitting: Internal Medicine

## 2016-09-22 ENCOUNTER — Ambulatory Visit (INDEPENDENT_AMBULATORY_CARE_PROVIDER_SITE_OTHER): Payer: Medicare Other | Admitting: Internal Medicine

## 2016-09-22 ENCOUNTER — Encounter (INDEPENDENT_AMBULATORY_CARE_PROVIDER_SITE_OTHER): Payer: Self-pay | Admitting: Internal Medicine

## 2016-09-22 VITALS — BP 112/76 | HR 68 | Temp 98.6°F | Resp 18 | Ht 69.0 in | Wt 210.2 lb

## 2016-09-22 DIAGNOSIS — K219 Gastro-esophageal reflux disease without esophagitis: Secondary | ICD-10-CM | POA: Diagnosis not present

## 2016-09-22 DIAGNOSIS — K5900 Constipation, unspecified: Secondary | ICD-10-CM

## 2016-09-22 DIAGNOSIS — K2270 Barrett's esophagus without dysplasia: Secondary | ICD-10-CM

## 2016-09-22 MED ORDER — POLYETHYLENE GLYCOL 3350 17 GM/SCOOP PO POWD: 25.5000 g | Freq: Every day | ORAL | 11 refills | Status: DC

## 2016-09-22 NOTE — Patient Instructions (Signed)
Can take Colace all at bedtime. Can use Dulcolax suppository on as-needed basis.

## 2016-09-22 NOTE — Progress Notes (Signed)
Presenting complaint;  Follow-up for chronic GERD and constipation.  Subjective:  Patient is 56 year old Caucasian female who is here for scheduled visit. She was last seen 6 months ago. She states she is doing well as for as her GERD symptoms are concerned. She does not remember the last time she had heartburn. She also denies esophagia throat symptoms or frequent regurgitation. However she does not feel that she is having good results with MiraLAX. She states is not working as well as it did in the past. She generally has 3 bowel movements per week. Once a month or so she has a day when she has multiple bowel movements when she feels her IBS acts up. She is presently on high-protein low-carb diet and trying to lose weight. She tries to read salads and fruits every day. He has lost 3 pounds since her last visit.   Current Medications: Outpatient Encounter Prescriptions as of 09/22/2016  Medication Sig  . amLODipine (NORVASC) 5 MG tablet TAKE (1) TABLET BY MOUTH ONCE DAILY.  Marland Kitchen aspirin (ASPIRIN LOW DOSE) 81 MG EC tablet Take 81 mg by mouth 2 (two) times daily. One tablet by mouth once daily  . baclofen (LIORESAL) 10 MG tablet Take 10 mg by mouth 2 (two) times daily.   . Cholecalciferol (VITAMIN D3) 3000 UNITS TABS Take 1 tablet by mouth daily.    . diazepam (VALIUM) 10 MG tablet Take 10 mg by mouth 3 (three) times daily as needed. One tablet by mouth 4 times daily  . docusate sodium (COLACE) 100 MG capsule Take 300 mg by mouth daily. One caplet by mouth  Two times a day  . DULoxetine (CYMBALTA) 30 MG capsule Take 30 mg by mouth daily. + 60 mg daily.  . ferrous sulfate (FERROUSUL) 325 (65 FE) MG tablet Take 1 tablet (325 mg total) by mouth 2 (two) times daily with a meal.  . fish oil-omega-3 fatty acids 1000 MG capsule Take by mouth daily. One cap by mouth daily     . gabapentin (NEURONTIN) 600 MG tablet Take 600 mg by mouth 3 (three) times daily.   Marland Kitchen HYDROmorphone (DILAUDID) 4 MG tablet Take 4  mg by mouth every 12 (twelve) hours as needed for severe pain.  Marland Kitchen ketoconazole (NIZORAL) 2 % cream Apply Bid prn for up to 7 days to affected area  . losartan (COZAAR) 100 MG tablet TAKE 1 TABLET ONCE DAILY.  . modafinil (PROVIGIL) 200 MG tablet   . multivitamin (THERAGRAN) per tablet Take 1 tablet by mouth daily. One tablet by mouth once daily   . nystatin-triamcinolone (MYCOLOG II) cream Apply 1 application topically 2 (two) times daily. Apply twice daily estrogen for one week and then as needed basis (Patient taking differently: Apply 1 application topically as needed. Apply twice daily estrogen for one week and then as needed basis)  . olopatadine (PATANOL) 0.1 % ophthalmic solution PLACE 1 DROP INTO BOTH EYES AS NEEDED.  Marland Kitchen omeprazole (PRILOSEC) 40 MG capsule Take 1 capsule (40 mg total) by mouth daily.  Marland Kitchen oxybutynin (DITROPAN-XL) 5 MG 24 hr tablet Take 5 mg by mouth at bedtime.  . polyethylene glycol powder (GLYCOLAX/MIRALAX) powder MIX 1 CAPFUL IN 8 OZ. OF JUICE OR WATER AND DRINK ONCE DAILY FOR CONSTIPATION.  Marland Kitchen potassium chloride (K-DUR) 10 MEQ tablet Take 10 mEq by mouth daily.  . TECFIDERA 240 MG CPDR 240 mg 2 (two) times daily.   Marland Kitchen topiramate (TOPAMAX) 100 MG tablet Take 100 mg by mouth daily.  Marland Kitchen  traMADol (ULTRAM) 50 MG tablet Take 100 mg by mouth 2 (two) times daily.   Marland Kitchen triamcinolone cream (KENALOG) 0.1 % Apply 1 application topically 2 (two) times daily.  . vitamin E 1000 UNIT capsule Take 1,000 Units by mouth daily.  . [DISCONTINUED] DEXILANT 60 MG capsule TAKE 1 CAPSULE BY MOUTH ONCE A DAY. (Patient not taking: Reported on 09/22/2016)   Facility-Administered Encounter Medications as of 09/22/2016  Medication  . Influenza (>/= 3 years) inactive virus vaccine (FLVIRIN/FLUZONE) injection SUSP 0.5 mL     Objective: Blood pressure 112/76, pulse 68, temperature 98.6 F (37 C), temperature source Oral, resp. rate 18, height 5\' 9"  (1.753 m), weight 210 lb 3.2 oz (95.3 kg). Patient is  alert and in no acute distress. Conjunctiva is pink. Sclera is nonicteric Oropharyngeal mucosa is normal. No neck masses or thyromegaly noted. Cardiac exam with regular rhythm normal S1 and S2. No murmur or gallop noted. Lungs are clear to auscultation. AbdomenIs full. On palpation is soft with mild tenderness at LLQ. No organomegaly or masses. No LE edema or clubbing noted.   Assessment:  #1. Chronic GERD complicated by short segment Barrett's esophagus. Symptoms are well controlled with therapy. She will need to continue PPI indefinitely. Last EGD was in April 2015. Will consider another EGD in 2 years. #2.  Chronic constipation. She is not having desired results with MiraLAX. Will try higher dose before switching to other medications. She is up-to-date on screening colonoscopy. Last exam was in April 2015.   Plan:  Patient advised to take Colace 20 mg by mouth daily at bedtime rather than splitting the dose. Increase polyethylene glycol to 23.5 g by mouth daily at bedtime. Can use Dulcolax suppository on as-needed basis or if she goes 2 days without bowel movement. Office visit in one year.

## 2016-10-19 ENCOUNTER — Other Ambulatory Visit: Payer: Self-pay | Admitting: Neurology

## 2016-10-19 DIAGNOSIS — G894 Chronic pain syndrome: Secondary | ICD-10-CM | POA: Diagnosis not present

## 2016-10-19 DIAGNOSIS — R269 Unspecified abnormalities of gait and mobility: Secondary | ICD-10-CM | POA: Diagnosis not present

## 2016-10-19 DIAGNOSIS — G35 Multiple sclerosis: Secondary | ICD-10-CM | POA: Diagnosis not present

## 2016-10-19 DIAGNOSIS — R5381 Other malaise: Secondary | ICD-10-CM | POA: Diagnosis not present

## 2016-10-19 DIAGNOSIS — Z79899 Other long term (current) drug therapy: Secondary | ICD-10-CM | POA: Diagnosis not present

## 2016-10-20 ENCOUNTER — Encounter (HOSPITAL_COMMUNITY): Payer: Self-pay

## 2016-10-20 ENCOUNTER — Encounter (HOSPITAL_COMMUNITY)
Admission: RE | Admit: 2016-10-20 | Discharge: 2016-10-20 | Disposition: A | Discharge status: Home / Self Care | Payer: Medicare Other | Source: Ambulatory Visit | Attending: Neurology | Admitting: Neurology

## 2016-10-20 DIAGNOSIS — G35 Multiple sclerosis: Secondary | ICD-10-CM | POA: Diagnosis not present

## 2016-10-20 MED ORDER — SODIUM CHLORIDE 0.9 % IV SOLN
1000.0000 mg | Freq: Once | INTRAVENOUS | Status: AC
  Administered 2016-10-20: 1000 mg via INTRAVENOUS
  Filled 2016-10-20: qty 8

## 2016-10-20 MED ORDER — SODIUM CHLORIDE 0.9 % IV SOLN
Freq: Once | INTRAVENOUS | Status: AC
  Administered 2016-10-20: 250 mL via INTRAVENOUS

## 2016-10-21 ENCOUNTER — Encounter (HOSPITAL_COMMUNITY)
Admission: RE | Admit: 2016-10-21 | Discharge: 2016-10-21 | Disposition: A | Discharge status: Home / Self Care | Payer: Medicare Other | Source: Ambulatory Visit | Attending: Neurology | Admitting: Neurology

## 2016-10-21 ENCOUNTER — Encounter (HOSPITAL_COMMUNITY): Payer: Self-pay

## 2016-10-21 DIAGNOSIS — Z79899 Other long term (current) drug therapy: Secondary | ICD-10-CM | POA: Diagnosis not present

## 2016-10-21 DIAGNOSIS — G35 Multiple sclerosis: Secondary | ICD-10-CM | POA: Diagnosis not present

## 2016-10-21 MED ORDER — METHYLPREDNISOLONE SODIUM SUCC 1000 MG IJ SOLR
1000.0000 mg | Freq: Every day | INTRAMUSCULAR | Status: DC
  Administered 2016-10-21: 1000 mg via INTRAVENOUS
  Filled 2016-10-21: qty 8

## 2016-10-21 MED ORDER — SODIUM CHLORIDE 0.9% FLUSH
INTRAVENOUS | Status: AC
  Filled 2016-10-21: qty 10

## 2016-10-21 MED ORDER — SODIUM CHLORIDE 0.9 % IV SOLN
Freq: Once | INTRAVENOUS | Status: AC
  Administered 2016-10-21: 12:00:00 via INTRAVENOUS

## 2016-10-22 ENCOUNTER — Encounter (HOSPITAL_COMMUNITY)
Admission: RE | Admit: 2016-10-22 | Discharge: 2016-10-22 | Disposition: A | Discharge status: Home / Self Care | Payer: Medicare Other | Source: Ambulatory Visit | Attending: Neurology | Admitting: Neurology

## 2016-10-22 ENCOUNTER — Encounter (HOSPITAL_COMMUNITY): Payer: Self-pay

## 2016-10-22 DIAGNOSIS — G35 Multiple sclerosis: Secondary | ICD-10-CM | POA: Diagnosis not present

## 2016-10-22 MED ORDER — SODIUM CHLORIDE 0.9 % IV SOLN
Freq: Once | INTRAVENOUS | Status: AC
  Administered 2016-10-22: 250 mL via INTRAVENOUS

## 2016-10-22 MED ORDER — SODIUM CHLORIDE 0.9 % IV SOLN
1000.0000 mg | Freq: Once | INTRAVENOUS | Status: AC
  Administered 2016-10-22: 1000 mg via INTRAVENOUS
  Filled 2016-10-22: qty 8

## 2016-10-23 ENCOUNTER — Encounter (HOSPITAL_COMMUNITY): Payer: Self-pay

## 2016-10-23 ENCOUNTER — Encounter (HOSPITAL_COMMUNITY)
Admission: RE | Admit: 2016-10-23 | Discharge: 2016-10-23 | Disposition: A | Discharge status: Home / Self Care | Payer: Medicare Other | Source: Ambulatory Visit | Attending: Neurology | Admitting: Neurology

## 2016-10-23 DIAGNOSIS — G35 Multiple sclerosis: Secondary | ICD-10-CM | POA: Diagnosis not present

## 2016-10-23 MED ORDER — SODIUM CHLORIDE 0.9 % IV SOLN
INTRAVENOUS | Status: DC
  Administered 2016-10-23: 250 mL via INTRAVENOUS

## 2016-10-23 MED ORDER — SODIUM CHLORIDE 0.9 % IV SOLN
1000.0000 mg | Freq: Once | INTRAVENOUS | Status: AC
  Administered 2016-10-23: 1000 mg via INTRAVENOUS
  Filled 2016-10-23: qty 8

## 2016-10-29 ENCOUNTER — Encounter (HOSPITAL_COMMUNITY): Payer: Medicare Other

## 2016-10-30 ENCOUNTER — Ambulatory Visit (HOSPITAL_COMMUNITY)
Admission: RE | Admit: 2016-10-30 | Discharge: 2016-10-30 | Disposition: A | Discharge status: Home / Self Care | Payer: Medicare Other | Source: Ambulatory Visit | Attending: Neurology | Admitting: Neurology

## 2016-10-30 DIAGNOSIS — G35 Multiple sclerosis: Secondary | ICD-10-CM | POA: Diagnosis not present

## 2016-10-30 DIAGNOSIS — R269 Unspecified abnormalities of gait and mobility: Secondary | ICD-10-CM | POA: Diagnosis not present

## 2016-10-30 DIAGNOSIS — M4802 Spinal stenosis, cervical region: Secondary | ICD-10-CM | POA: Insufficient documentation

## 2016-10-30 DIAGNOSIS — M50322 Other cervical disc degeneration at C5-C6 level: Secondary | ICD-10-CM | POA: Insufficient documentation

## 2016-10-30 DIAGNOSIS — M50223 Other cervical disc displacement at C6-C7 level: Secondary | ICD-10-CM | POA: Diagnosis not present

## 2016-10-30 DIAGNOSIS — R51 Headache: Secondary | ICD-10-CM | POA: Diagnosis not present

## 2016-10-30 LAB — POCT I-STAT CREATININE: CREATININE: 0.7 mg/dL (ref 0.44–1.00)

## 2016-10-30 MED ORDER — GADOBENATE DIMEGLUMINE 529 MG/ML IV SOLN
20.0000 mL | Freq: Once | INTRAVENOUS | Status: AC | PRN
  Administered 2016-10-30: 20 mL via INTRAVENOUS

## 2016-12-16 DIAGNOSIS — G35 Multiple sclerosis: Secondary | ICD-10-CM | POA: Diagnosis not present

## 2016-12-16 DIAGNOSIS — R5381 Other malaise: Secondary | ICD-10-CM | POA: Diagnosis not present

## 2016-12-16 DIAGNOSIS — Z79899 Other long term (current) drug therapy: Secondary | ICD-10-CM | POA: Diagnosis not present

## 2016-12-16 DIAGNOSIS — G894 Chronic pain syndrome: Secondary | ICD-10-CM | POA: Diagnosis not present

## 2016-12-16 DIAGNOSIS — R269 Unspecified abnormalities of gait and mobility: Secondary | ICD-10-CM | POA: Diagnosis not present

## 2017-01-16 ENCOUNTER — Other Ambulatory Visit: Payer: Self-pay | Admitting: Family Medicine

## 2017-02-04 DIAGNOSIS — Z79899 Other long term (current) drug therapy: Secondary | ICD-10-CM | POA: Diagnosis not present

## 2017-02-17 ENCOUNTER — Ambulatory Visit: Payer: Medicare Other | Admitting: Family Medicine

## 2017-03-03 ENCOUNTER — Encounter (HOSPITAL_COMMUNITY)
Admission: RE | Admit: 2017-03-03 | Discharge: 2017-03-03 | Disposition: A | Discharge status: Home / Self Care | Payer: Medicare Other | Source: Ambulatory Visit | Attending: Neurology | Admitting: Neurology

## 2017-03-03 DIAGNOSIS — G35 Multiple sclerosis: Secondary | ICD-10-CM | POA: Insufficient documentation

## 2017-03-03 MED ORDER — SODIUM CHLORIDE 0.9 % IV SOLN
500.0000 mg | Freq: Once | INTRAVENOUS | Status: AC
  Administered 2017-03-03: 500 mg via INTRAVENOUS
  Filled 2017-03-03: qty 4

## 2017-03-03 MED ORDER — SODIUM CHLORIDE 0.9 % IV SOLN
300.0000 mg | Freq: Once | INTRAVENOUS | Status: AC
  Administered 2017-03-03: 300 mg via INTRAVENOUS
  Filled 2017-03-03: qty 10

## 2017-03-03 MED ORDER — DIPHENHYDRAMINE HCL 25 MG PO CAPS
50.0000 mg | ORAL_CAPSULE | Freq: Once | ORAL | Status: AC
  Administered 2017-03-03: 50 mg via ORAL
  Filled 2017-03-03: qty 2

## 2017-03-03 NOTE — Discharge Instructions (Signed)
Ocrelizumab injection What is this medicine? OCRELIZUMAB (ok re LIZ ue mab) treats multiple sclerosis. It helps to decrease the number of multiple sclerosis relapses. It is not a cure. This medicine may be used for other purposes; ask your health care provider or pharmacist if you have questions. COMMON BRAND NAME(S): OCREVUS What should I tell my health care provider before I take this medicine? They need to know if you have any of these conditions: -cancer -hepatitis B infection -other infection (especially a virus infection such as chickenpox, cold sores, or herpes) -an unusual or allergic reaction to ocrelizumab, other medicines, foods, dyes or preservatives -pregnant or trying to get pregnant -breast-feeding How should I use this medicine? This medicine is for infusion into a vein. It is given by a health care professional in a hospital or clinic setting. Talk to your pediatrician regarding the use of this medicine in children. Special care may be needed. Overdosage: If you think you have taken too much of this medicine contact a poison control center or emergency room at once. NOTE: This medicine is only for you. Do not share this medicine with others. What if I miss a dose? Keep appointments for follow-up doses as directed. It is important not to miss your dose. Call your doctor or health care professional if you are unable to keep an appointment. What may interact with this medicine? -alemtuzumab -daclizumab -dimethyl fumarate -fingolimod -glatiramer -interferon beta -live virus vaccines -mitoxantrone -natalizumab -peginterferon beta -rituximab -steroid medicines like prednisone or cortisone -teriflunomide This list may not describe all possible interactions. Give your health care provider a list of all the medicines, herbs, non-prescription drugs, or dietary supplements you use. Also tell them if you smoke, drink alcohol, or use illegal drugs. Some items may interact with  your medicine. What should I watch for while using this medicine? Tell your doctor or healthcare professional if your symptoms do not start to get better or if they get worse. This medicine can cause serious allergic reactions. To reduce your risk you may need to take medicine before treatment with this medicine. Take your medicine as directed. Women should inform their doctor if they wish to become pregnant or think they might be pregnant. There is a potential for serious side effects to an unborn child. Talk to your health care professional or pharmacist for more information. Female patients should use effective birth control methods while receiving this medicine and for 6 months after the last dose. Call your doctor or health care professional for advice if you get a fever, chills or sore throat, or other symptoms of a cold or flu. Do not treat yourself. This drug decreases your body's ability to fight infections. Try to avoid being around people who are sick. If you have a hepatitis B infection or a history of a hepatitis B infection, talk to your doctor. The symptoms of hepatitis B may get worse if you take this medicine. In some patients, this medicine may cause a serious brain infection that may cause death. If you have any problems seeing, thinking, speaking, walking, or standing, tell your doctor right away. If you cannot reach your doctor, urgently seek other source of medical care. This medicine can decrease the response to a vaccine. If you need to get vaccinated, tell your healthcare professional if you have received this medicine. Extra booster doses may be needed. Talk to your doctor to see if a different vaccination schedule is needed. Talk to your doctor about your risk of cancer.   You may be more at risk for certain types of cancers if you take this medicine. What side effects may I notice from receiving this medicine? Side effects that you should report to your doctor or health care  professional as soon as possible: -allergic reactions like skin rash, itching or hives, swelling of the face, lips, or tongue -breathing problems -facial flushing -fast, irregular heartbeat -lump or soreness in the breast -signs and symptoms of herpes such as cold sore, shingles, or genital sores -signs and symptoms of infection like fever or chills, cough, sore throat, pain or trouble passing urine -signs and symptoms of low blood pressure like dizziness; feeling faint or lightheaded, falls; unusually weak or tired -signs and symptoms of progressive multifocal leukoencephalopathy (PML) like changes in vision; clumsiness; confusion; personality changes; weakness on one side of the body -swelling of the ankles, feet, hands Side effects that usually do not require medical attention (report these to your doctor or health care professional if they continue or are bothersome): -back pain -depressed mood -diarrhea -pain, redness, or irritation at site where injected This list may not describe all possible side effects. Call your doctor for medical advice about side effects. You may report side effects to FDA at 1-800-FDA-1088. Where should I keep my medicine? This drug is given in a hospital or clinic and will not be stored at home. NOTE: This sheet is a summary. It may not cover all possible information. If you have questions about this medicine, talk to your doctor, pharmacist, or health care provider.  2018 Elsevier/Gold Standard (2015-08-13 09:40:25)  

## 2017-03-04 NOTE — Progress Notes (Signed)
Pt complained of itching on her back and back of her head. Redness and small raised bumps noted. Infusion paused, Dr. Merlene Laughter called. New order received for an additional dose of Solumedrol and to continue infusion at lower rate. Pt tolerated infusion at 114ml/hr. Will make note in the pt chart for next time. Upon discharge from clinic, pt educated to take additional benadryl if itching returns or worsens and if there are any respiratory issues to call 911 or come to ED.

## 2017-03-15 DIAGNOSIS — Z1231 Encounter for screening mammogram for malignant neoplasm of breast: Secondary | ICD-10-CM | POA: Diagnosis not present

## 2017-03-17 ENCOUNTER — Encounter (HOSPITAL_COMMUNITY)
Admission: RE | Admit: 2017-03-17 | Discharge: 2017-03-17 | Disposition: A | Discharge status: Home / Self Care | Payer: Medicare Other | Source: Ambulatory Visit | Attending: Neurology | Admitting: Neurology

## 2017-03-17 DIAGNOSIS — G35 Multiple sclerosis: Secondary | ICD-10-CM | POA: Insufficient documentation

## 2017-03-17 MED ORDER — DIPHENHYDRAMINE HCL 25 MG PO CAPS
50.0000 mg | ORAL_CAPSULE | Freq: Once | ORAL | Status: AC
  Administered 2017-03-17: 50 mg via ORAL
  Filled 2017-03-17: qty 2

## 2017-03-17 MED ORDER — SODIUM CHLORIDE 0.9 % IV SOLN
1000.0000 mg | Freq: Once | INTRAVENOUS | Status: AC
  Administered 2017-03-17: 1000 mg via INTRAVENOUS
  Filled 2017-03-17: qty 8

## 2017-03-17 MED ORDER — SODIUM CHLORIDE 0.9 % IV SOLN
300.0000 mg | Freq: Once | INTRAVENOUS | Status: AC
  Administered 2017-03-17: 300 mg via INTRAVENOUS
  Filled 2017-03-17: qty 10

## 2017-03-17 MED ORDER — SODIUM CHLORIDE 0.9 % IV SOLN
Freq: Once | INTRAVENOUS | Status: AC
  Administered 2017-03-17: 250 mL via INTRAVENOUS

## 2017-03-24 ENCOUNTER — Other Ambulatory Visit: Payer: Self-pay | Admitting: Family Medicine

## 2017-03-29 DIAGNOSIS — Z79899 Other long term (current) drug therapy: Secondary | ICD-10-CM | POA: Diagnosis not present

## 2017-03-29 DIAGNOSIS — R5381 Other malaise: Secondary | ICD-10-CM | POA: Diagnosis not present

## 2017-03-29 DIAGNOSIS — G894 Chronic pain syndrome: Secondary | ICD-10-CM | POA: Diagnosis not present

## 2017-03-29 DIAGNOSIS — G35 Multiple sclerosis: Secondary | ICD-10-CM | POA: Diagnosis not present

## 2017-04-26 ENCOUNTER — Other Ambulatory Visit: Payer: Self-pay | Admitting: Family Medicine

## 2017-04-27 ENCOUNTER — Other Ambulatory Visit: Payer: Self-pay | Admitting: *Deleted

## 2017-04-27 MED ORDER — AMLODIPINE BESYLATE 5 MG PO TABS: ORAL_TABLET | ORAL | 0 refills | Status: DC

## 2017-05-06 ENCOUNTER — Ambulatory Visit: Payer: Medicare Other | Admitting: Family Medicine

## 2017-05-06 ENCOUNTER — Encounter: Payer: Self-pay | Admitting: Family Medicine

## 2017-05-06 VITALS — BP 122/82 | Ht 69.0 in | Wt 218.0 lb

## 2017-05-06 DIAGNOSIS — E7849 Other hyperlipidemia: Secondary | ICD-10-CM

## 2017-05-06 DIAGNOSIS — T7840XA Allergy, unspecified, initial encounter: Secondary | ICD-10-CM

## 2017-05-06 DIAGNOSIS — I10 Essential (primary) hypertension: Secondary | ICD-10-CM

## 2017-05-06 MED ORDER — AMLODIPINE BESYLATE 5 MG PO TABS: ORAL_TABLET | ORAL | 0 refills | Status: DC

## 2017-05-06 MED ORDER — AMLODIPINE BESYLATE 5 MG PO TABS: ORAL_TABLET | ORAL | 1 refills | Status: DC

## 2017-05-06 MED ORDER — PREDNISONE 20 MG PO TABS: ORAL_TABLET | ORAL | 0 refills | Status: DC

## 2017-05-06 NOTE — Progress Notes (Signed)
   Subjective:    Patient ID: Natalie Campos, female    DOB: [birth year], 58 y.o.   MRN: [id number]   Hypertension   This is a chronic problem. The current episode started more than 1 year ago. Pertinent negatives include no chest pain, headaches or shortness of breath. Risk factors for coronary artery disease include post-menopausal state. Treatments tried: norvasc.  There are no compliance problems.    Patient has been going through a lot her MS has been flaring up causing significant troubles Patient also relates a red rash that occurred on her upper back around her neck itches to some degree  Review of Systems  Constitutional: Negative for activity change, fatigue and fever.  HENT: Negative for congestion.   Respiratory: Negative for cough, chest tightness and shortness of breath.   Cardiovascular: Negative for chest pain and leg swelling.  Gastrointestinal: Negative for abdominal pain.  Skin: Negative for color change.  Neurological: Negative for headaches.  Psychiatric/Behavioral: Negative for behavioral problems.       Objective:   Physical Exam  Constitutional: She appears well-developed and well-nourished. No distress.  HENT:  Head: Normocephalic and atraumatic.  Eyes: Right eye exhibits no discharge. Left eye exhibits no discharge.  Neck: No tracheal deviation present.  Cardiovascular: Normal rate, regular rhythm and normal heart sounds.  No murmur heard. Pulmonary/Chest: Effort normal and breath sounds normal. No respiratory distress. She has no wheezes. She has no rales.  Musculoskeletal: She exhibits no edema.  Lymphadenopathy:    She has no cervical adenopathy.  Neurological: She is alert. She exhibits normal muscle tone.  Skin: Skin is warm and dry. Rash noted. There is erythema.  Psychiatric: Her behavior is normal.  Vitals reviewed.  1 patient has erythematous rash around her neck and upper back itches to some degree confluence noted  I find no evidence of  throat or lung involvement it is hard to know if this is a side effect to the medication she was on or possible allergic reaction     Assessment & Plan:  Hyperlipidemia check lipid profile previous labs showed HDL very good LDL mildly elevated healthy diet recommended  Follow-up with specialist for MS  Significant erythema around the neck with rash could be related to the medication she is on from her specialist- Ocrevu I recommend Benadryl and prednisone  If this area worsens over the next few days follow-up with your specialist for further opinion

## 2017-05-31 ENCOUNTER — Other Ambulatory Visit: Payer: Self-pay | Admitting: Family Medicine

## 2017-06-02 ENCOUNTER — Other Ambulatory Visit: Payer: Self-pay | Admitting: *Deleted

## 2017-06-02 MED ORDER — LOSARTAN POTASSIUM 100 MG PO TABS: 100.0000 mg | ORAL_TABLET | Freq: Every day | ORAL | 5 refills | Status: DC

## 2017-06-23 DIAGNOSIS — Z79891 Long term (current) use of opiate analgesic: Secondary | ICD-10-CM | POA: Diagnosis not present

## 2017-06-23 DIAGNOSIS — G35 Multiple sclerosis: Secondary | ICD-10-CM | POA: Diagnosis not present

## 2017-06-23 DIAGNOSIS — Z79899 Other long term (current) drug therapy: Secondary | ICD-10-CM | POA: Diagnosis not present

## 2017-06-23 DIAGNOSIS — R5381 Other malaise: Secondary | ICD-10-CM | POA: Diagnosis not present

## 2017-06-23 DIAGNOSIS — G894 Chronic pain syndrome: Secondary | ICD-10-CM | POA: Diagnosis not present

## 2017-07-12 ENCOUNTER — Encounter (HOSPITAL_COMMUNITY)
Admission: RE | Admit: 2017-07-12 | Discharge: 2017-07-12 | Disposition: A | Discharge status: Home / Self Care | Payer: Medicare Other | Source: Ambulatory Visit | Attending: Neurology | Admitting: Neurology

## 2017-07-12 DIAGNOSIS — G35 Multiple sclerosis: Secondary | ICD-10-CM | POA: Insufficient documentation

## 2017-07-12 MED ORDER — SODIUM CHLORIDE 0.9 % IV SOLN
INTRAVENOUS | Status: DC
  Administered 2017-07-12: 250 mL via INTRAVENOUS

## 2017-07-12 MED ORDER — SODIUM CHLORIDE 0.9 % IV SOLN
1000.0000 mg | Freq: Once | INTRAVENOUS | Status: AC
  Administered 2017-07-12: 1000 mg via INTRAVENOUS
  Filled 2017-07-12: qty 8

## 2017-07-13 ENCOUNTER — Encounter (HOSPITAL_COMMUNITY): Payer: Self-pay

## 2017-07-13 ENCOUNTER — Encounter (HOSPITAL_COMMUNITY)
Admission: RE | Admit: 2017-07-13 | Discharge: 2017-07-13 | Disposition: A | Discharge status: Home / Self Care | Payer: Medicare Other | Source: Ambulatory Visit | Attending: Neurology | Admitting: Neurology

## 2017-07-13 DIAGNOSIS — G35 Multiple sclerosis: Secondary | ICD-10-CM | POA: Diagnosis not present

## 2017-07-13 MED ORDER — SODIUM CHLORIDE 0.9 % IV SOLN
1000.0000 mg | Freq: Every day | INTRAVENOUS | Status: DC
  Administered 2017-07-13: 1000 mg via INTRAVENOUS
  Filled 2017-07-13: qty 8

## 2017-07-13 MED ORDER — SODIUM CHLORIDE 0.9 % IV SOLN
INTRAVENOUS | Status: DC
  Administered 2017-07-13: 12:00:00 via INTRAVENOUS

## 2017-07-13 NOTE — Progress Notes (Signed)
Patient tolerated infusion without any issues.  IV flushed and clamped.  To be infused tomorrow,

## 2017-07-14 ENCOUNTER — Encounter (HOSPITAL_COMMUNITY)
Admission: RE | Admit: 2017-07-14 | Discharge: 2017-07-14 | Disposition: A | Discharge status: Home / Self Care | Payer: Medicare Other | Source: Ambulatory Visit | Attending: Neurology | Admitting: Neurology

## 2017-07-14 DIAGNOSIS — G35 Multiple sclerosis: Secondary | ICD-10-CM | POA: Diagnosis not present

## 2017-07-14 MED ORDER — SODIUM CHLORIDE 0.9 % IV SOLN
Freq: Once | INTRAVENOUS | Status: AC
  Administered 2017-07-14: 250 mL via INTRAVENOUS

## 2017-07-14 MED ORDER — SODIUM CHLORIDE 0.9 % IV SOLN
1000.0000 mg | Freq: Once | INTRAVENOUS | Status: AC
  Administered 2017-07-14: 1000 mg via INTRAVENOUS
  Filled 2017-07-14: qty 8

## 2017-07-20 DIAGNOSIS — Z029 Encounter for administrative examinations, unspecified: Secondary | ICD-10-CM

## 2017-08-10 ENCOUNTER — Other Ambulatory Visit: Payer: Self-pay | Admitting: Family Medicine

## 2017-08-11 ENCOUNTER — Other Ambulatory Visit (HOSPITAL_COMMUNITY)
Admission: RE | Admit: 2017-08-11 | Discharge: 2017-08-11 | Disposition: A | Discharge status: Home / Self Care | Payer: Medicare Other | Source: Ambulatory Visit | Attending: *Deleted | Admitting: *Deleted

## 2017-08-11 ENCOUNTER — Ambulatory Visit (HOSPITAL_COMMUNITY)
Admission: RE | Admit: 2017-08-11 | Discharge: 2017-08-11 | Disposition: A | Discharge status: Home / Self Care | Payer: Medicare Other | Source: Ambulatory Visit | Attending: Family Medicine | Admitting: Family Medicine

## 2017-08-11 ENCOUNTER — Encounter: Payer: Self-pay | Admitting: Family Medicine

## 2017-08-11 ENCOUNTER — Ambulatory Visit (INDEPENDENT_AMBULATORY_CARE_PROVIDER_SITE_OTHER): Payer: Medicare Other | Admitting: Family Medicine

## 2017-08-11 VITALS — BP 122/88 | Temp 97.7°F | Ht 69.0 in | Wt 229.0 lb

## 2017-08-11 DIAGNOSIS — R0602 Shortness of breath: Secondary | ICD-10-CM | POA: Diagnosis not present

## 2017-08-11 DIAGNOSIS — K449 Diaphragmatic hernia without obstruction or gangrene: Secondary | ICD-10-CM | POA: Diagnosis not present

## 2017-08-11 DIAGNOSIS — R0600 Dyspnea, unspecified: Secondary | ICD-10-CM | POA: Insufficient documentation

## 2017-08-11 DIAGNOSIS — J988 Other specified respiratory disorders: Secondary | ICD-10-CM

## 2017-08-11 DIAGNOSIS — R079 Chest pain, unspecified: Secondary | ICD-10-CM | POA: Diagnosis not present

## 2017-08-11 LAB — CBC WITH DIFFERENTIAL/PLATELET
Basophils Absolute: 0.1 10*3/uL (ref 0.0–0.1)
Basophils Relative: 1 %
EOS ABS: 0.3 10*3/uL (ref 0.0–0.7)
EOS PCT: 7 %
HCT: 41.9 % (ref 36.0–46.0)
Hemoglobin: 14.2 g/dL (ref 12.0–15.0)
LYMPHS ABS: 0.8 10*3/uL (ref 0.7–4.0)
Lymphocytes Relative: 17 %
MCH: 31.1 pg (ref 26.0–34.0)
MCHC: 33.9 g/dL (ref 30.0–36.0)
MCV: 91.7 fL (ref 78.0–100.0)
MONO ABS: 0.3 10*3/uL (ref 0.1–1.0)
Monocytes Relative: 7 %
Neutro Abs: 3.2 10*3/uL (ref 1.7–7.7)
Neutrophils Relative %: 68 %
PLATELETS: 237 10*3/uL (ref 150–400)
RBC: 4.57 MIL/uL (ref 3.87–5.11)
RDW: 12.4 % (ref 11.5–15.5)
WBC: 4.7 10*3/uL (ref 4.0–10.5)

## 2017-08-11 LAB — D-DIMER, QUANTITATIVE: D-Dimer, Quant: <0.27 ug/mL-FEU (ref 0.00–0.50)

## 2017-08-11 MED ORDER — DOXYCYCLINE HYCLATE 100 MG PO TABS: 100.0000 mg | ORAL_TABLET | Freq: Two times a day (BID) | ORAL | 0 refills | Status: DC

## 2017-08-11 NOTE — Progress Notes (Signed)
   Subjective:    Patient ID: Natalie Campos, female    DOB: Sep 10, 1960, 57 y.o.   MRN: [id number]  HPI Patient is here today with possible URI.Patient states she has had chest pain,breathing,dizziness,chest congestion,productive cough, Wheezing,Bilateral ear pain,headache, sore throat, Vomiting, body aches. She at times feels unable to get a good breath.Chest pain three days ago with some abd pain.She states these symptoms for 7 days now. Has been taking pepto,Ibuprofen.States she has been dxd with MS in the past. Patient relates having some intermittent body aches head congestion drainage coughing not feeling good also some chest pains and feeling short of breath at times his chest pains are sharp chest pains with inspiration  Review of Systems  Constitutional: Negative for activity change and fever.  HENT: Positive for congestion and rhinorrhea. Negative for ear pain.   Eyes: Negative for discharge.  Respiratory: Positive for cough. Negative for shortness of breath and wheezing.   Cardiovascular: Negative for chest pain.       Objective:   Physical Exam  Constitutional: She appears well-developed.  HENT:  Head: Normocephalic.  Right Ear: External ear normal.  Left Ear: External ear normal.  Nose: Nose normal.  Mouth/Throat: Oropharynx is clear and moist. No oropharyngeal exudate.  Eyes: Right eye exhibits no discharge. Left eye exhibits no discharge.  Neck: Neck supple. No tracheal deviation present.  Cardiovascular: Normal rate and normal heart sounds.  No murmur heard. Pulmonary/Chest: Effort normal and breath sounds normal. She has no wheezes. She has no rales.  Lymphadenopathy:    She has no cervical adenopathy.  Skin: Skin is warm and dry.  Nursing note and vitals reviewed.         Assessment & Plan:  Patient at risk of blood clots We will need to check chest x-ray also check d-dimer At risk of pneumonia Results came back d-dimer was negative chest x-ray  negative Doxycycline prescribed for 7 days Patient was talked to if she gets worse over the course of the next couple weeks or the next several days to follow-up

## 2017-08-12 ENCOUNTER — Encounter: Payer: Self-pay | Admitting: Family Medicine

## 2017-08-12 DIAGNOSIS — K449 Diaphragmatic hernia without obstruction or gangrene: Secondary | ICD-10-CM | POA: Insufficient documentation

## 2017-08-23 ENCOUNTER — Telehealth: Payer: Self-pay | Admitting: Family Medicine

## 2017-08-23 NOTE — Telephone Encounter (Signed)
Patient states has yeast infection and needing something called in she was last seen 4/3. She states cant drive due to MS episode right now.She uses [facility]

## 2017-08-23 NOTE — Telephone Encounter (Signed)
Left message to return call to get symptoms °

## 2017-08-24 ENCOUNTER — Other Ambulatory Visit: Payer: Self-pay | Admitting: *Deleted

## 2017-08-24 MED ORDER — FLUCONAZOLE 150 MG PO TABS: ORAL_TABLET | ORAL | 0 refills | Status: DC

## 2017-08-24 NOTE — Telephone Encounter (Signed)
Vaginal itching, recent antibiotic use. Diflucan 150mg  #2 one po 3 days apart per dr Nicki Reaper. Pt notified med sent to pharm.

## 2017-08-24 NOTE — Telephone Encounter (Signed)
Left message to return call 

## 2017-09-09 DIAGNOSIS — H4720 Unspecified optic atrophy: Secondary | ICD-10-CM | POA: Diagnosis not present

## 2017-09-10 NOTE — Progress Notes (Signed)
Message left on Carolyn's phone from [place] re: Orie Fisherman and her Ocrevus medication, and to see about coordinating the shipment for her infusion. I contacted [facility] and Jonelle Sidle stated that there was Ocrevus in stock.  I then contacted [facility] and talked to [place]. Order [id number]. Darla stated that the pt had been approved for this med and that the med was free, and she wanted to make sure that we would accept this free med for the pt. I informed her that she would have to talk to the pharmacy. I then talked with Mickel Baas in our pharmacy and explained what was happening. I gave Mickel Baas the phone [phone], the order # and she stated that she would have Izora Gala to F/U.

## 2017-09-13 DIAGNOSIS — G35 Multiple sclerosis: Secondary | ICD-10-CM | POA: Diagnosis not present

## 2017-09-13 DIAGNOSIS — R5381 Other malaise: Secondary | ICD-10-CM | POA: Diagnosis not present

## 2017-09-13 DIAGNOSIS — G249 Dystonia, unspecified: Secondary | ICD-10-CM | POA: Diagnosis not present

## 2017-09-13 DIAGNOSIS — Z79899 Other long term (current) drug therapy: Secondary | ICD-10-CM | POA: Diagnosis not present

## 2017-09-14 ENCOUNTER — Encounter (HOSPITAL_COMMUNITY): Payer: Medicare Other

## 2017-09-14 ENCOUNTER — Encounter (HOSPITAL_COMMUNITY)
Admission: RE | Admit: 2017-09-14 | Discharge: 2017-09-14 | Disposition: A | Discharge status: Home / Self Care | Payer: Medicare Other | Source: Ambulatory Visit | Attending: Neurology | Admitting: Neurology

## 2017-09-14 DIAGNOSIS — G35 Multiple sclerosis: Secondary | ICD-10-CM | POA: Insufficient documentation

## 2017-09-14 MED ORDER — METHYLPREDNISOLONE SODIUM SUCC 125 MG IJ SOLR
100.0000 mg | Freq: Once | INTRAMUSCULAR | Status: AC
Start: 2017-09-14 — End: 2017-09-14
  Administered 2017-09-14: 100 mg via INTRAVENOUS
  Filled 2017-09-14: qty 2

## 2017-09-14 MED ORDER — SODIUM CHLORIDE 0.9 % IV SOLN
INTRAVENOUS | Status: DC
Start: 2017-09-14 — End: 2017-09-15
  Administered 2017-09-14: 250 mL via INTRAVENOUS

## 2017-09-14 MED ORDER — ACETAMINOPHEN 325 MG PO TABS
650.0000 mg | ORAL_TABLET | Freq: Once | ORAL | Status: AC
  Administered 2017-09-14: 650 mg via ORAL
  Filled 2017-09-14: qty 2

## 2017-09-14 MED ORDER — SODIUM CHLORIDE 0.9 % IV SOLN
600.0000 mg | Freq: Once | INTRAVENOUS | Status: AC
  Administered 2017-09-14: 600 mg via INTRAVENOUS
  Filled 2017-09-14: qty 20

## 2017-09-14 MED ORDER — DIPHENHYDRAMINE HCL 50 MG/ML IJ SOLN
25.0000 mg | Freq: Once | INTRAMUSCULAR | Status: AC
  Administered 2017-09-14: 25 mg via INTRAVENOUS
  Filled 2017-09-14: qty 1

## 2017-09-14 NOTE — Progress Notes (Signed)
Pt request IV rate not to be increased above 120 ml/hr.

## 2017-09-21 ENCOUNTER — Ambulatory Visit (INDEPENDENT_AMBULATORY_CARE_PROVIDER_SITE_OTHER): Payer: Medicare Other | Admitting: Internal Medicine

## 2017-10-15 ENCOUNTER — Encounter (HOSPITAL_COMMUNITY): Payer: Medicare Other

## 2017-10-26 ENCOUNTER — Encounter (INDEPENDENT_AMBULATORY_CARE_PROVIDER_SITE_OTHER): Payer: Self-pay | Admitting: Internal Medicine

## 2017-10-26 ENCOUNTER — Ambulatory Visit (INDEPENDENT_AMBULATORY_CARE_PROVIDER_SITE_OTHER): Payer: Medicare Other | Admitting: Internal Medicine

## 2017-10-26 VITALS — BP 118/70 | HR 64 | Temp 98.4°F | Resp 18 | Ht 69.0 in | Wt 232.1 lb

## 2017-10-26 DIAGNOSIS — K219 Gastro-esophageal reflux disease without esophagitis: Secondary | ICD-10-CM | POA: Diagnosis not present

## 2017-10-26 DIAGNOSIS — K2270 Barrett's esophagus without dysplasia: Secondary | ICD-10-CM

## 2017-10-26 MED ORDER — FAMOTIDINE 20 MG PO TABS: 20.0000 mg | ORAL_TABLET | Freq: Every day | ORAL | Status: DC

## 2017-10-26 NOTE — Progress Notes (Signed)
Presenting complaint;  Follow-up for chronic GERD.  Database and subjective:  Patient is 57 year old Caucasian female who is here for scheduled visit.  She was last seen in May 2018. She has a history of short segment Barrett's esophagus which was initially discovered in December 2010 and biopsy revealed low-grade dysplasia.  She had a follow-up EGD in July 2011 and biopsies were negative for low-grade dysplasia. Last EGD with biopsy was in April 2015 and biopsy was negative for dysplasia. She underwent colonoscopy also in April 2015 revealing 2 anal papillae and external hemorrhoids otherwise normal examination.  She states her heartburn is not been well controlled with therapy.  She does well during the daytime but she has heartburn and regurgitation after she goes to bed.  She has tried [facility] but it does not help.  She she sleeps on 2 pillows.  She goes to bed within 2 hours of evening meal which is her biggest meal.  She denies nausea vomiting or dysphagia.  She says her bowels are moving regularly as long she takes her medication. She also complains of urinary incontinence and voiding difficulty as well as persistent symptoms of Candida vaginitis. She states she had first infusion of Ocrevus in November 2018 she developed a rash.  She also had epigastric pain with nausea but she did not have epigastric pain nausea with a second infusion but she did develop a rash few days after.  She was treated with Benadryl.  She states she was treated with Diflucan for 1 day but it did not make any difference.  Then she was treated for 7 days and now she is using OTC medications. She uses ibuprofen occasionally for migraine.  She says in the last 6 months she is only taken 6 pills. She has gained 22 pounds since her last visit.  She believes weight gain is secondary to [place].   Current Medications: Outpatient Encounter Medications as of 10/26/2017  Medication Sig  . amLODipine (NORVASC) 5 MG tablet  TAKE 1 TABLET BY MOUTH ONCE DAILY.  Marland Kitchen aspirin (ASPIRIN LOW DOSE) 81 MG EC tablet Take 81 mg by mouth 2 (two) times daily. One tablet by mouth once daily  . baclofen (LIORESAL) 10 MG tablet Take 10 mg by mouth 4 (four) times daily.   . Cholecalciferol (VITAMIN D3) 3000 UNITS TABS Take 1 tablet by mouth daily. 50,000 u weekly  . diazepam (VALIUM) 10 MG tablet Take 10 mg by mouth 3 (three) times daily as needed. One tablet by mouth 4 times daily  . docusate sodium (COLACE) 100 MG capsule Take 300 mg by mouth daily. One caplet by mouth  Two times a day  . DULoxetine (CYMBALTA) 30 MG capsule Take 60 mg by mouth daily.   . ferrous sulfate (FERROUSUL) 325 (65 FE) MG tablet Take 1 tablet (325 mg total) by mouth 2 (two) times daily with a meal.  . fish oil-omega-3 fatty acids 1000 MG capsule Take by mouth daily. One cap by mouth daily     . gabapentin (NEURONTIN) 600 MG tablet Take 600 mg by mouth 3 (three) times daily.   Marland Kitchen HYDROmorphone (DILAUDID) 4 MG tablet Take 4 mg by mouth every 12 (twelve) hours as needed for severe pain.  Marland Kitchen losartan (COZAAR) 100 MG tablet Take 1 tablet (100 mg total) by mouth daily.  . modafinil (PROVIGIL) 200 MG tablet   . multivitamin (THERAGRAN) per tablet Take 1 tablet by mouth daily. One tablet by mouth once daily   . Ocrelizumab (  OCREVUS IV) Inject into the vein. Infused every 6 months.  Marland Kitchen olopatadine (PATANOL) 0.1 % ophthalmic solution PLACE 1 DROP INTO BOTH EYES AS NEEDED.  Marland Kitchen omeprazole (PRILOSEC) 40 MG capsule Take 1 capsule (40 mg total) by mouth daily.  Marland Kitchen oxybutynin (DITROPAN-XL) 5 MG 24 hr tablet Take 5 mg by mouth at bedtime.  . polyethylene glycol powder (GLYCOLAX/MIRALAX) powder Take 25.5 g by mouth daily.  . potassium chloride (K-DUR) 10 MEQ tablet Take 10 mEq by mouth daily.  Marland Kitchen topiramate (TOPAMAX) 100 MG tablet Take 100 mg by mouth daily.  . traMADol (ULTRAM) 50 MG tablet Take 100 mg by mouth 2 (two) times daily.   Marland Kitchen triamcinolone cream (KENALOG) 0.1 % Apply 1  application topically 2 (two) times daily.  . vitamin E 1000 UNIT capsule Take 1,000 Units by mouth daily.  . [DISCONTINUED] doxycycline (VIBRA-TABS) 100 MG tablet Take 1 tablet (100 mg total) by mouth 2 (two) times daily. (Patient not taking: Reported on 10/26/2017)  . [DISCONTINUED] fluconazole (DIFLUCAN) 150 MG tablet Take one tablet po 3 days apart (Patient not taking: Reported on 10/26/2017)  . [DISCONTINUED] predniSONE (DELTASONE) 20 MG tablet 3 tablets a day for 2 days, then 2 tablets a day for 2 days, then one tablet a day for 2 days (Patient not taking: Reported on 08/11/2017)   Facility-Administered Encounter Medications as of 10/26/2017  Medication  . Influenza (>/= 3 years) inactive virus vaccine (FLVIRIN/FLUZONE) injection SUSP 0.5 mL     Objective: Blood pressure 118/70, pulse 64, temperature 98.4 F (36.9 C), temperature source Oral, resp. rate 18, height 5\' 9"  (1.753 m), weight 232 lb 1.6 oz (105.3 kg). Patient is alert and in no acute distress. Conjunctiva is pink. Sclera is nonicteric Oropharyngeal mucosa is normal. No neck masses or thyromegaly noted. Cardiac exam with regular rhythm normal S1 and S2. No murmur or gallop noted. Lungs are clear to auscultation. Abdomen is full.  On palpation it is soft and nontender without organomegaly or masses. No LE edema or clubbing noted.   Labs/studies Results:  CBC from 08/11/2017 WBC 4.7, H&H 14.2 and 41.9 and platelet count 237K.  Assessment:  #1.  Chronic GERD complicated by short segment Barrett's esophagus.  Last EGD was in April 2015 and biopsy was negative for dysplasia.  She is having frequent breakthrough symptoms at night.  She has gained over 20 pounds since her last visit may be contributing to worsening of GERD symptoms.  She may benefit from nightly H2 B.  If it does not work she will need to be on double dose PPI.  #2.  Chronic constipation.  She is doing well with therapy. She is up-to-date on screening for  CRC.   Plan:  Continue omeprazole 40 mg p.o. every morning. Famotidine 20 mg p.o. Nightly. Patient will call with progress report in 2 to 3 weeks. If nocturnal symptoms persist will  PPI to twice daily. Will consider next surveillance EGD in April 2020. Next screening for CRC would be in April 2025. Office visit in 6 months.

## 2017-10-26 NOTE — Patient Instructions (Signed)
Take famotidine OTC 20 mg daily at bedtime. Please call office with progress report in 2 to 4 weeks.

## 2017-11-02 DIAGNOSIS — N762 Acute vulvitis: Secondary | ICD-10-CM | POA: Diagnosis not present

## 2017-12-01 ENCOUNTER — Other Ambulatory Visit: Payer: Self-pay | Admitting: Family Medicine

## 2017-12-08 DIAGNOSIS — G249 Dystonia, unspecified: Secondary | ICD-10-CM | POA: Diagnosis not present

## 2017-12-08 DIAGNOSIS — G35 Multiple sclerosis: Secondary | ICD-10-CM | POA: Diagnosis not present

## 2017-12-08 DIAGNOSIS — G894 Chronic pain syndrome: Secondary | ICD-10-CM | POA: Diagnosis not present

## 2017-12-08 DIAGNOSIS — R5381 Other malaise: Secondary | ICD-10-CM | POA: Diagnosis not present

## 2017-12-08 DIAGNOSIS — Z79899 Other long term (current) drug therapy: Secondary | ICD-10-CM | POA: Diagnosis not present

## 2017-12-08 DIAGNOSIS — Z79891 Long term (current) use of opiate analgesic: Secondary | ICD-10-CM | POA: Diagnosis not present

## 2017-12-30 ENCOUNTER — Other Ambulatory Visit: Payer: Self-pay | Admitting: Family Medicine

## 2018-03-01 DIAGNOSIS — Z79891 Long term (current) use of opiate analgesic: Secondary | ICD-10-CM | POA: Diagnosis not present

## 2018-03-01 DIAGNOSIS — G35 Multiple sclerosis: Secondary | ICD-10-CM | POA: Diagnosis not present

## 2018-03-01 DIAGNOSIS — G894 Chronic pain syndrome: Secondary | ICD-10-CM | POA: Diagnosis not present

## 2018-03-01 DIAGNOSIS — Z79899 Other long term (current) drug therapy: Secondary | ICD-10-CM | POA: Diagnosis not present

## 2018-03-01 DIAGNOSIS — R5381 Other malaise: Secondary | ICD-10-CM | POA: Diagnosis not present

## 2018-03-16 NOTE — Progress Notes (Signed)
Per patient, she had influenza injection, as well as pneumonia vaccine on October 21st.  Per Remo Lipps, pharmacist, may continue with Ocrevus infusion tomorrow, as it has been 2 weeks since injections were administered.

## 2018-03-17 ENCOUNTER — Encounter (HOSPITAL_COMMUNITY)
Admission: RE | Admit: 2018-03-17 | Discharge: 2018-03-17 | Disposition: A | Discharge status: Home / Self Care | Payer: Medicare Other | Source: Ambulatory Visit | Attending: Neurology | Admitting: Neurology

## 2018-03-17 ENCOUNTER — Encounter (HOSPITAL_COMMUNITY): Payer: Self-pay

## 2018-03-17 DIAGNOSIS — G35 Multiple sclerosis: Secondary | ICD-10-CM | POA: Diagnosis not present

## 2018-03-17 MED ORDER — ACETAMINOPHEN 325 MG PO TABS
650.0000 mg | ORAL_TABLET | Freq: Once | ORAL | Status: AC
  Administered 2018-03-17: 650 mg via ORAL
  Filled 2018-03-17: qty 2

## 2018-03-17 MED ORDER — DIPHENHYDRAMINE HCL 50 MG/ML IJ SOLN
25.0000 mg | Freq: Once | INTRAMUSCULAR | Status: AC
  Administered 2018-03-17: 50 mg via INTRAVENOUS
  Filled 2018-03-17: qty 1

## 2018-03-17 MED ORDER — SODIUM CHLORIDE 0.9 % IV SOLN
Freq: Once | INTRAVENOUS | Status: AC
  Administered 2018-03-17: 09:00:00 via INTRAVENOUS

## 2018-03-17 MED ORDER — SODIUM CHLORIDE 0.9 % IV SOLN
600.0000 mg | Freq: Once | INTRAVENOUS | Status: AC
  Administered 2018-03-17: 600 mg via INTRAVENOUS
  Filled 2018-03-17: qty 20

## 2018-03-17 MED ORDER — METHYLPREDNISOLONE SODIUM SUCC 125 MG IJ SOLR
100.0000 mg | Freq: Once | INTRAMUSCULAR | Status: AC
  Administered 2018-03-17: 100 mg via INTRAVENOUS
  Filled 2018-03-17: qty 2

## 2018-03-25 ENCOUNTER — Encounter (HOSPITAL_COMMUNITY): Payer: Self-pay

## 2018-03-25 ENCOUNTER — Emergency Department (HOSPITAL_COMMUNITY)
Admission: EM | Admit: 2018-03-25 | Discharge: 2018-03-25 | Disposition: A | Discharge status: Home / Self Care | Payer: Medicare Other | Attending: Emergency Medicine | Admitting: Emergency Medicine

## 2018-03-25 ENCOUNTER — Emergency Department (HOSPITAL_COMMUNITY): Payer: Medicare Other

## 2018-03-25 ENCOUNTER — Other Ambulatory Visit: Payer: Self-pay

## 2018-03-25 ENCOUNTER — Telehealth: Payer: Self-pay | Admitting: Family Medicine

## 2018-03-25 DIAGNOSIS — R0602 Shortness of breath: Secondary | ICD-10-CM

## 2018-03-25 DIAGNOSIS — J40 Bronchitis, not specified as acute or chronic: Secondary | ICD-10-CM

## 2018-03-25 DIAGNOSIS — I10 Essential (primary) hypertension: Secondary | ICD-10-CM | POA: Insufficient documentation

## 2018-03-25 DIAGNOSIS — M791 Myalgia, unspecified site: Secondary | ICD-10-CM

## 2018-03-25 DIAGNOSIS — Z7982 Long term (current) use of aspirin: Secondary | ICD-10-CM | POA: Diagnosis not present

## 2018-03-25 DIAGNOSIS — R059 Cough, unspecified: Secondary | ICD-10-CM

## 2018-03-25 DIAGNOSIS — R05 Cough: Secondary | ICD-10-CM | POA: Diagnosis not present

## 2018-03-25 DIAGNOSIS — R0789 Other chest pain: Secondary | ICD-10-CM | POA: Diagnosis not present

## 2018-03-25 DIAGNOSIS — R079 Chest pain, unspecified: Secondary | ICD-10-CM | POA: Diagnosis present

## 2018-03-25 DIAGNOSIS — Z87891 Personal history of nicotine dependence: Secondary | ICD-10-CM | POA: Insufficient documentation

## 2018-03-25 DIAGNOSIS — G35 Multiple sclerosis: Secondary | ICD-10-CM | POA: Insufficient documentation

## 2018-03-25 DIAGNOSIS — Z79899 Other long term (current) drug therapy: Secondary | ICD-10-CM | POA: Insufficient documentation

## 2018-03-25 LAB — CBC WITH DIFFERENTIAL/PLATELET
Abs Immature Granulocytes: 0.02 10*3/uL (ref 0.00–0.07)
BASOS PCT: 1 %
Basophils Absolute: 0.1 10*3/uL (ref 0.0–0.1)
EOS ABS: 0.4 10*3/uL (ref 0.0–0.5)
Eosinophils Relative: 8 %
HCT: 42.4 % (ref 36.0–46.0)
Hemoglobin: 14.4 g/dL (ref 12.0–15.0)
Immature Granulocytes: 0 %
Lymphocytes Relative: 14 %
Lymphs Abs: 0.7 10*3/uL (ref 0.7–4.0)
MCH: 30.8 pg (ref 26.0–34.0)
MCHC: 34 g/dL (ref 30.0–36.0)
MCV: 90.8 fL (ref 80.0–100.0)
MONOS PCT: 8 %
Monocytes Absolute: 0.4 10*3/uL (ref 0.1–1.0)
Neutro Abs: 3.5 10*3/uL (ref 1.7–7.7)
Neutrophils Relative %: 69 %
PLATELETS: 225 10*3/uL (ref 150–400)
RBC: 4.67 MIL/uL (ref 3.87–5.11)
RDW: 12.3 % (ref 11.5–15.5)
WBC: 5.1 10*3/uL (ref 4.0–10.5)
nRBC: 0 % (ref 0.0–0.2)

## 2018-03-25 LAB — COMPREHENSIVE METABOLIC PANEL
ALK PHOS: 110 U/L (ref 38–126)
ALT: 16 U/L (ref 0–44)
ANION GAP: 9 (ref 5–15)
AST: 20 U/L (ref 15–41)
Albumin: 3.9 g/dL (ref 3.5–5.0)
BUN: 11 mg/dL (ref 6–20)
CHLORIDE: 109 mmol/L (ref 98–111)
CO2: 23 mmol/L (ref 22–32)
Calcium: 9 mg/dL (ref 8.9–10.3)
Creatinine, Ser: 0.75 mg/dL (ref 0.44–1.00)
GFR calc Af Amer: >60 mL/min (ref >60)
Glucose, Bld: 101 mg/dL — ABNORMAL HIGH (ref 70–99)
Potassium: 3.1 mmol/L — ABNORMAL LOW (ref 3.5–5.1)
Sodium: 141 mmol/L (ref 135–145)
TOTAL PROTEIN: 7.2 g/dL (ref 6.5–8.1)
Total Bilirubin: 0.8 mg/dL (ref 0.3–1.2)

## 2018-03-25 LAB — I-STAT CG4 LACTIC ACID, ED
LACTIC ACID, VENOUS: 1.49 mmol/L (ref 0.5–1.9)
Lactic Acid, Venous: 2.17 mmol/L (ref 0.5–1.9)

## 2018-03-25 LAB — INFLUENZA PANEL BY PCR (TYPE A & B)
INFLBPCR: NEGATIVE
Influenza A By PCR: NEGATIVE

## 2018-03-25 LAB — URINALYSIS, ROUTINE W REFLEX MICROSCOPIC
BILIRUBIN URINE: NEGATIVE
Bacteria, UA: NONE SEEN
GLUCOSE, UA: NEGATIVE mg/dL
HGB URINE DIPSTICK: NEGATIVE
KETONES UR: NEGATIVE mg/dL
Nitrite: NEGATIVE
PROTEIN: NEGATIVE mg/dL
Specific Gravity, Urine: 1.005 (ref 1.005–1.030)
pH: 7 (ref 5.0–8.0)

## 2018-03-25 LAB — D-DIMER, QUANTITATIVE: D-Dimer, Quant: 0.32 ug/mL-FEU (ref 0.00–0.50)

## 2018-03-25 LAB — I-STAT TROPONIN, ED: TROPONIN I, POC: 0 ng/mL (ref 0.00–0.08)

## 2018-03-25 MED ORDER — ACETAMINOPHEN 500 MG PO TABS
1000.0000 mg | ORAL_TABLET | Freq: Once | ORAL | Status: AC
  Administered 2018-03-25: 1000 mg via ORAL
  Filled 2018-03-25: qty 2

## 2018-03-25 MED ORDER — POTASSIUM CHLORIDE CRYS ER 20 MEQ PO TBCR
40.0000 meq | EXTENDED_RELEASE_TABLET | Freq: Once | ORAL | Status: AC
  Administered 2018-03-25: 40 meq via ORAL
  Filled 2018-03-25: qty 2

## 2018-03-25 MED ORDER — IPRATROPIUM-ALBUTEROL 0.5-2.5 (3) MG/3ML IN SOLN
3.0000 mL | Freq: Once | RESPIRATORY_TRACT | Status: AC
  Administered 2018-03-25: 3 mL via RESPIRATORY_TRACT
  Filled 2018-03-25: qty 3

## 2018-03-25 MED ORDER — SODIUM CHLORIDE 0.9 % IV BOLUS
1000.0000 mL | Freq: Once | INTRAVENOUS | Status: AC
  Administered 2018-03-25: 1000 mL via INTRAVENOUS

## 2018-03-25 MED ORDER — BENZONATATE 100 MG PO CAPS: 100.0000 mg | ORAL_CAPSULE | Freq: Three times a day (TID) | ORAL | 0 refills | Status: DC

## 2018-03-25 NOTE — ED Triage Notes (Signed)
Pt reports having MS and having infusions of Ocrevus x 3. Pt reports left sided abdominal pain since Sunday. Pt reports coughing and sore throat. Pt reports SOB and pain in shoulder blades and neck

## 2018-03-25 NOTE — ED Provider Notes (Signed)
[facility] EMERGENCY DEPARTMENT Provider Note   CSN: [id number] Arrival date & time: 03/25/18  1027     History   Chief Complaint Chief Complaint  Patient presents with  . Chest Pain  . Shortness of Breath  . Cough    HPI NAI BORROMEO is a 57 y.o. female.  LAKISHIA BOURASSA is a 57 y.o. Female with a history of MS, hypertension, hyperlipidemia, GERD, IBS, who presents to the emergency department for evaluation of multiple symptoms.  Patient reports she recently received her third infusion of Ocrevus about 1 week ago, and then on Sunday experience sudden onset severe upper abdominal pain with numerous associated episodes of vomiting, the symptoms seem to resolve by Monday morning and after resting she started to feel better but Tuesday began having some intermittent coughing and sore throat.  And reports now she is having pain in her chest and back and neck, pain is worse when she takes a deep breath and she feels that her chest is tight.  She denies having any fevers or chills.  Cough is nonproductive.  She has not had any additional episodes of abdominal pain, nausea or vomiting.  Denies any urinary symptoms.  She called her PCP regarding the symptoms who recommended she present to the emergency department for evaluation has not discussed the symptoms with her neurologist who has been managing her Ocrevus treatment, she reports many of the symptoms has been reported as potential side effects from these medications but she has not experienced all the symptoms with her previous infusions.     Past Medical History:  Diagnosis Date  . Anemia   . Barrett's esophagus   . Bulging discs   . Depression   . DJD (degenerative joint disease)   . Dog bite(E906.0) july 29,2011  . Eczema   . Emphysema   . GERD (gastroesophageal reflux disease)   . High triglycerides   . Hyperlipidemia   . Hypertension   . IBS (irritable bowel syndrome)    contipation predominant   . Iron deficiency  anemia   . Multiple sclerosis ([place]) 1990   Dx in 1990 most recent hospitalization for a flareis in 2011   . Obesity     Patient Active Problem List   Diagnosis Date Noted  . Hiatal hernia 08/12/2017  . Diarrhea 09/11/2015  . GERD (gastroesophageal reflux disease) 09/11/2015  . Anemia 08/01/2013  . Vertigo 10/16/2011  . Allergic reaction 10/16/2011  . FATIGUE 07/01/2010  . CAROTID BRUIT 02/27/2010  . OBESITY, UNSPECIFIED 06/24/2009  . ACUTE SINUSITIS, UNSPECIFIED 06/24/2009  . HEADACHE 06/24/2009  . Hyperlipidemia 05/23/2009  . DEPRESSION 05/23/2009  . MULTIPLE SCLEROSIS 05/23/2009  . Essential hypertension 05/23/2009  . EMPHYSEMA 05/23/2009  . GERD 05/23/2009  . IRRITABLE BOWEL SYNDROME 05/23/2009  . ECZEMA 05/23/2009  . DEGENERATIVE DISC DISEASE 05/23/2009    Past Surgical History:  Procedure Laterality Date  . BIOPSY  01/28/2011   Procedure: BIOPSY;  Surgeon: Rogene Houston, MD;  Location: [facility] ENDO SUITE;  Service: Endoscopy;  Laterality: N/A;  . BIOPSY N/A 08/25/2013   Procedure: BIOPSY;  Surgeon: Rogene Houston, MD;  Location: [facility] ORS;  Service: Endoscopy;  Laterality: N/A;  . CHOLECYSTECTOMY  2010   Dr. Anthony Sar   . COLONOSCOPY WITH PROPOFOL N/A 08/25/2013   Procedure: COLONOSCOPY WITH PROPOFOL;  Surgeon: Rogene Houston, MD;  Location: [facility] ORS;  Service: Endoscopy;  Laterality: N/A;  in cecum at 0811 out at 0822 = 11 minutes  . ESOPHAGOGASTRODUODENOSCOPY  01/28/2011   Procedure: ESOPHAGOGASTRODUODENOSCOPY (EGD);  Surgeon: Rogene Houston, MD;  Location: [facility];  Service: Endoscopy;  Laterality: N/A;  1:00  . ESOPHAGOGASTRODUODENOSCOPY (EGD) WITH PROPOFOL N/A 08/25/2013   Procedure: ESOPHAGOGASTRODUODENOSCOPY (EGD) WITH PROPOFOL;  Surgeon: Rogene Houston, MD;  Location: [facility] ORS;  Service: Endoscopy;  Laterality: N/A;  . WISDOM TOOTH EXTRACTION       OB History   None      Home Medications    Prior to Admission medications   Medication Sig Start Date End  Date Taking? Authorizing Provider  amLODipine (NORVASC) 5 MG tablet TAKE 1 TABLET BY MOUTH ONCE DAILY. 12/30/17   Kathyrn Drown, MD  aspirin (ASPIRIN LOW DOSE) 81 MG EC tablet Take 81 mg by mouth 2 (two) times daily. One tablet by mouth once daily    [provider]  baclofen (LIORESAL) 10 MG tablet Take 10 mg by mouth 4 (four) times daily.     [provider]  Cholecalciferol (VITAMIN D3) 3000 UNITS TABS Take 1 tablet by mouth daily. 50,000 u weekly    [provider]  diazepam (VALIUM) 10 MG tablet Take 10 mg by mouth 3 (three) times daily as needed. One tablet by mouth 4 times daily    [provider]  docusate sodium (COLACE) 100 MG capsule Take 300 mg by mouth daily. One caplet by mouth  Two times a day    [provider]  DULoxetine (CYMBALTA) 30 MG capsule Take 60 mg by mouth daily.     [provider]  famotidine (PEPCID) 20 MG tablet Take 1 tablet (20 mg total) by mouth at bedtime. 10/26/17   Rogene Houston, MD  ferrous sulfate (FERROUSUL) 325 (65 FE) MG tablet Take 1 tablet (325 mg total) by mouth 2 (two) times daily with a meal. 08/25/13   Rehman, Mechele Dawley, MD  fish oil-omega-3 fatty acids 1000 MG capsule Take by mouth daily. One cap by mouth daily       [provider]  gabapentin (NEURONTIN) 600 MG tablet Take 600 mg by mouth 3 (three) times daily.     [provider]  HYDROmorphone (DILAUDID) 4 MG tablet Take 4 mg by mouth every 12 (twelve) hours as needed for severe pain.    [provider]  losartan (COZAAR) 100 MG tablet TAKE 1 TABLET ONCE DAILY. 12/02/17   Kathyrn Drown, MD  modafinil (PROVIGIL) 200 MG tablet  09/27/15   [provider]  multivitamin [facility]) per tablet Take 1 tablet by mouth daily. One tablet by mouth once daily     [provider]  Ocrelizumab (OCREVUS IV) Inject into the vein. Infused every 6 months.    [provider]  olopatadine (PATANOL) 0.1 %  ophthalmic solution PLACE 1 DROP INTO BOTH EYES AS NEEDED. 10/23/15   Mikey Kirschner, MD  omeprazole (PRILOSEC) 40 MG capsule Take 1 capsule (40 mg total) by mouth daily. 08/06/16   Rehman, Mechele Dawley, MD  oxybutynin (DITROPAN-XL) 5 MG 24 hr tablet Take 5 mg by mouth at bedtime.    [provider]  polyethylene glycol powder (GLYCOLAX/MIRALAX) powder Take 25.5 g by mouth daily. 09/22/16   Rehman, Mechele Dawley, MD  potassium chloride (K-DUR) 10 MEQ tablet Take 10 mEq by mouth daily.    [provider]  topiramate (TOPAMAX) 100 MG tablet Take 100 mg by mouth daily.    [provider]  traMADol (ULTRAM) 50 MG tablet Take 100  mg by mouth 2 (two) times daily.     [provider]  triamcinolone cream (KENALOG) 0.1 % Apply 1 application topically 2 (two) times daily. 07/30/14   Kathyrn Drown, MD  vitamin E 1000 UNIT capsule Take 1,000 Units by mouth daily.    [provider]    Family History No family history on file.  Social History Social History   Tobacco Use  . Smoking status: Former Smoker    Packs/day: 1.50    Years: 30.00    Pack years: 45.00    Types: Cigarettes    Last attempt to quit: 03/08/2008    Years since quitting: 10.0  . Smokeless tobacco: Never Used  Substance Use Topics  . Alcohol use: No    Alcohol/week: 0.0 standard drinks  . Drug use: No     Allergies   Ace inhibitors and Penicillins   Review of Systems Review of Systems  Constitutional: Positive for chills. Negative for fever.  HENT: Positive for sore throat. Negative for congestion, ear pain and rhinorrhea.   Eyes: Negative for visual disturbance.  Respiratory: Positive for cough, chest tightness and shortness of breath. Negative for wheezing.   Cardiovascular: Positive for chest pain. Negative for palpitations and leg swelling.  Gastrointestinal: Positive for abdominal pain, nausea and vomiting. Negative for blood in stool and diarrhea.  Genitourinary: Negative  for dysuria, flank pain, frequency and hematuria.  Musculoskeletal: Positive for myalgias. Negative for arthralgias and back pain.  Skin: Negative for color change and rash.  Neurological: Negative for dizziness, syncope, weakness, light-headedness, numbness and headaches.     Physical Exam Updated Vital Signs BP (!) 145/87   Pulse 97   Temp 98.2 F (36.8 C) (Oral)   Resp 20   Ht 5\' 9"  (1.753 m)   Wt 98.4 kg   SpO2 98%   BMI 32.04 kg/m   Physical Exam  Constitutional: She is oriented to person, place, and time. She appears well-developed and well-nourished.  Non-toxic appearance. She does not appear ill. No distress.  HENT:  Head: Normocephalic and atraumatic.  TMs clear with good landmarks, moderate nasal mucosa edema with clear rhinorrhea, posterior oropharynx clear and moist, with some erythema, no edema or exudates  Eyes: Right eye exhibits no discharge. Left eye exhibits no discharge.  Neck: Normal range of motion. Neck supple. No JVD present. No tracheal deviation present.  Cardiovascular: Normal rate, regular rhythm and intact distal pulses. Exam reveals no gallop and no friction rub.  No murmur heard. Pulses:      Radial pulses are 2+ on the right side, and 2+ on the left side.       Dorsalis pedis pulses are 2+ on the right side, and 2+ on the left side.  Pulmonary/Chest: Effort normal and breath sounds normal. No respiratory distress.  Respirations equal and unlabored, patient able to speak in full sentences, lungs clear to auscultation bilaterally, tenderness to palpation over the anterior chest wall without palpable deformity or overlying skin changes.  Abdominal: Soft. Bowel sounds are normal. She exhibits no distension and no mass. There is no tenderness. There is no guarding.  Abdomen is soft, nondistended, bowel sounds present throughout, all quadrants nontender to palpation without guarding or rigidity, no peritoneal signs, no CVA tenderness bilaterally.    Musculoskeletal:       Right lower leg: Normal. She exhibits no tenderness and no edema.       Left lower leg: Normal. She exhibits no tenderness and no edema.  Neurological: She is alert and oriented to person, place, and time. Coordination normal.  Speech is clear, able to follow commands CN III-XII intact Normal strength in upper and lower extremities bilaterally including dorsiflexion and plantar flexion, strong and equal grip strength Sensation normal to light and sharp touch Moves extremities without ataxia, coordination intact  Skin: Skin is warm and dry. No rash noted. She is not diaphoretic.  Psychiatric: She has a normal mood and affect. Her behavior is normal.  Nursing note and vitals reviewed.    ED Treatments / Results  Labs (all labs ordered are listed, but only abnormal results are displayed) Labs Reviewed  COMPREHENSIVE METABOLIC PANEL - Abnormal; Notable for the following components:      Result Value   Potassium 3.1 (*)    Glucose, Bld 101 (*)    All other components within normal limits  URINALYSIS, ROUTINE W REFLEX MICROSCOPIC - Abnormal; Notable for the following components:   Color, Urine STRAW (*)    Leukocytes, UA TRACE (*)    All other components within normal limits  I-STAT CG4 LACTIC ACID, ED - Abnormal; Notable for the following components:   Lactic Acid, Venous 2.17 (*)    All other components within normal limits  CBC WITH DIFFERENTIAL/PLATELET  D-DIMER, QUANTITATIVE (NOT AT [facility])  INFLUENZA PANEL BY PCR (TYPE A & B)  I-STAT TROPONIN, ED  I-STAT CG4 LACTIC ACID, ED    EKG None  Radiology Dg Chest 2 View  Result Date: 03/25/2018 CLINICAL DATA:  Chest heaviness, cough, congestion, and shortness of breath for the past 4 days. Possible medication reaction. History of multiple scleroses. History of emphysema, former smoker. EXAM: CHEST - 2 VIEW COMPARISON:  PA and lateral chest x-ray of August 11, 2017 FINDINGS: The lungs are well-expanded. There  is no focal infiltrate. The interstitial markings are chronically increased. There is no pleural effusion. The heart and pulmonary vascularity are normal. There is a large hiatal hernia. The bony thorax exhibits no acute abnormality. IMPRESSION: Chronic bronchitic changes. No pneumonia, CHF, nor other acute abnormality. Large hiatal hernia. Electronically Signed   By: David  Martinique M.D.   On: 03/25/2018 11:53    Procedures Procedures (including critical care time)  Medications Ordered in ED Medications  sodium chloride 0.9 % bolus 1,000 mL (0 mLs Intravenous Stopped 03/25/18 1223)  ipratropium-albuterol (DUONEB) 0.5-2.5 (3) MG/3ML nebulizer solution 3 mL (3 mLs Nebulization Given 03/25/18 1217)  potassium chloride SA (K-DUR,KLOR-CON) CR tablet 40 mEq (40 mEq Oral Given 03/25/18 1203)  acetaminophen (TYLENOL) tablet 1,000 mg (1,000 mg Oral Given 03/25/18 1240)     Initial Impression / Assessment and Plan / ED Course  I have reviewed the triage vital signs and the nursing notes.  Pertinent labs & imaging results that were available during my care of the patient were reviewed by me and considered in my medical decision making (see chart for details).  Patient presents for evaluation of multiple symptoms over the past week including abdominal pain, vomiting, cough, sore throat, chest pain and shortness of breath.  Patient is on immunologic medication to manage her MS, and had infusion about a week ago just prior to symptoms beginning.  On arrival today patient has normal vitals and appears well and in no acute distress.  Lungs are clear to auscultation, heart RRR, abdomen is nontender to palpation in all quadrants and she has no neurologic deficits.  Given patient's constellation of symptoms and her medical history, we will get basic labs, troponin, EKG,  d-dimer, chest x-ray, lactic acid, influenza panel, and urinalysis.  I suspect patient is dehydrated from her numerous episodes of vomiting earlier  in the week we will give IV fluid bolus.  EKG without concerning ischemic changes and troponin is negative, lab evaluation is reassuring with no leukocytosis and normal hemoglobin, no acute electrolyte derangements, normal renal and liver function.  Urinalysis without signs of infection.  D-dimer is negative.  Chest x-ray shows some chronic bronchitic changes but no focal infiltrate and pneumonia no other acute cardiopulmonary processes.  Influenza is negative.  Lactic acid was initially slightly elevated at 2.65, given reassuring work-up I suspect that this is due to dehydration, will recheck after IV fluid bolus.  Suspect viral bronchitis causing some muscle soreness around the chest wall, no clear etiology for the abdominal pain or vomiting that patient had 5 days ago but I am reassured that the symptoms have not reoccurred patient does not have any focal abdominal tenderness and no signs of surgical abdomen today.  Give breathing treatment and see if this helps improve chest tightness.  Lactic acid has cleared after IV fluids.  Patient does have mild hypokalemia which was treated with p.o. potassium here today.  At this time I feel patient is stable for discharge home and close follow-up with her PCP.   At this time there does not appear to be any evidence of an acute emergency medical condition and the patient appears stable for discharge with appropriate outpatient follow up.Diagnosis was discussed with patient who verbalizes understanding and is agreeable to discharge. Pt case discussed with Dr. Thurnell Garbe who agrees with my plan.    Final Clinical Impressions(s) / ED Diagnoses   Final diagnoses:  Cough  Bronchitis  Atypical chest pain  Shortness of breath  Myalgia    ED Discharge Orders         Ordered    benzonatate (TESSALON) 100 MG capsule  Every 8 hours     03/25/18 1417           Jacqlyn Larsen, PA-C 03/25/18 1651    Francine Graven, DO 03/28/18 2214

## 2018-03-25 NOTE — Telephone Encounter (Signed)
Pt contacted office and stated that she had an infusion on Thursday and on Sunday she began to have breast pain, vomiting, trouble breathing, neck/back pain, vomiting when she lies down, dizziness, and diarrhea. Pt states that on Monday the symptoms stopped and Tues/Wed she layed in bed. States she feels OK today and her breathing is OK. Pt informed to go to ER to get checked. Pt verbalized understanding.

## 2018-03-25 NOTE — ED Notes (Signed)
Pt was informed that we need a urine sample. Pt states that she can not urinate at this time. 

## 2018-03-25 NOTE — Telephone Encounter (Signed)
I agree with management 

## 2018-03-25 NOTE — Discharge Instructions (Signed)
Your work-up today is very reassuring and suggest no acute problem with your heart or lungs.  You likely have viral bronchitis, please take ibuprofen and Tylenol as needed for pain, over-the-counter cough medications or prescribed Tessalon Perles and follow-up with your primary care doctor.  I am not sure what caused the episode of abdominal pain you had on Sunday but I am reassured that it is not occurring today and your work-up looks good.  Make sure you are drinking plenty of fluids.

## 2018-03-25 NOTE — ED Notes (Signed)
CRITICAL VALUE ALERT  Critical Value:  Lactic Acid 2.5  Date & Time Notied:  03/25/18 1139  Provider Notified: Benedetto Goad, PA  Orders Received/Actions taken: EDP notified, no further orders given

## 2018-03-28 ENCOUNTER — Other Ambulatory Visit: Payer: Self-pay | Admitting: Family Medicine

## 2018-04-11 ENCOUNTER — Other Ambulatory Visit: Payer: Self-pay | Admitting: Family Medicine

## 2018-04-11 NOTE — Telephone Encounter (Signed)
May have this +1 refill needs follow-up office visit 

## 2018-04-13 ENCOUNTER — Telehealth: Payer: Self-pay | Admitting: Family Medicine

## 2018-04-13 NOTE — Telephone Encounter (Signed)
Patient said Dr. Nicki Reaper told her husband that she needed to call and get in this week for continued cough and being sick since she was in the hospital a couple weeks ago.  She said he said she needed to be seen this week but I have no available appts except tomorrow at  4:00 or 4:30.

## 2018-04-13 NOTE — Telephone Encounter (Signed)
She could be seen tomorrow 4 or 430 If this does not work please let me know

## 2018-04-14 ENCOUNTER — Ambulatory Visit: Payer: Medicare Other | Admitting: Family Medicine

## 2018-04-14 ENCOUNTER — Encounter: Payer: Self-pay | Admitting: Family Medicine

## 2018-04-14 VITALS — BP 132/80 | Temp 98.2°F | Ht 69.0 in | Wt 223.0 lb

## 2018-04-14 DIAGNOSIS — E876 Hypokalemia: Secondary | ICD-10-CM | POA: Diagnosis not present

## 2018-04-14 DIAGNOSIS — R0609 Other forms of dyspnea: Secondary | ICD-10-CM

## 2018-04-14 DIAGNOSIS — R0600 Dyspnea, unspecified: Secondary | ICD-10-CM

## 2018-04-14 MED ORDER — MOMETASONE FUROATE 0.1 % EX CREA: TOPICAL_CREAM | CUTANEOUS | 1 refills | Status: DC

## 2018-04-14 NOTE — Progress Notes (Signed)
   Subjective:    Patient ID: Natalie Campos, female    DOB: Jan 04, 1961, 57 y.o.   MRN: [id number]  Cough  This is a new problem. Episode onset: nov 10th. Associated symptoms include shortness of breath. Pertinent negatives include no chest pain or rhinorrhea. Associated symptoms comments: Vomiting, diarrhea, headache, abdominal pain, wheezing. Treatments tried: benzonatate, fluids, potassium, breathing treatments in the hospital on 03/25/18.  ER notes reviewed Recently started new treatment for MS Having progressive shortness of breath Had a negative d-dimer negative chest x-ray in the ER     Review of Systems  Constitutional: Negative for activity change, appetite change and fatigue.  HENT: Negative for congestion and rhinorrhea.   Respiratory: Positive for cough and shortness of breath.   Cardiovascular: Negative for chest pain and leg swelling.  Gastrointestinal: Negative for abdominal pain and diarrhea.  Endocrine: Negative for polydipsia and polyphagia.  Skin: Negative for color change.  Neurological: Negative for dizziness and weakness.  Psychiatric/Behavioral: Negative for behavioral problems and confusion.       Objective:   Physical Exam  Constitutional: She appears well-nourished. No distress.  HENT:  Head: Normocephalic and atraumatic.  Eyes: Right eye exhibits no discharge. Left eye exhibits no discharge.  Neck: No tracheal deviation present.  Cardiovascular: Normal rate, regular rhythm and normal heart sounds.  No murmur heard. Pulmonary/Chest: Effort normal and breath sounds normal. No respiratory distress.  Musculoskeletal: She exhibits no edema.  Lymphadenopathy:    She has no cervical adenopathy.  Neurological: She is alert. Coordination normal.  Skin: Skin is warm and dry.  Psychiatric: She has a normal mood and affect. Her behavior is normal.  Vitals reviewed.  No pedal edema  25 minutes was spent with the patient.  This statement verifies that 25  minutes was indeed spent with the patient.  More than 50% of this visit-total duration of the visit-was spent in counseling and coordination of care. The issues that the patient came in for today as reflected in the diagnosis (s) please refer to documentation for further details.      Assessment & Plan:  Difficult situation Progressive cough with shortness of breath Was seen in the ER had a negative d-dimer negative chest x-ray Still having ongoing shortness of breath Repeat lab work Echo indicated Cardiology consult indicated Could be representative of a left ventricular dysfunction versus possibility of a coronary artery disease buildup Do not feel the patient needs to be hospitalized currently.

## 2018-04-15 ENCOUNTER — Other Ambulatory Visit (HOSPITAL_COMMUNITY)
Admission: RE | Admit: 2018-04-15 | Discharge: 2018-04-15 | Disposition: A | Discharge status: Home / Self Care | Payer: Medicare Other | Source: Ambulatory Visit | Attending: Family Medicine | Admitting: Family Medicine

## 2018-04-15 ENCOUNTER — Telehealth: Payer: Self-pay | Admitting: Family Medicine

## 2018-04-15 DIAGNOSIS — E876 Hypokalemia: Secondary | ICD-10-CM | POA: Insufficient documentation

## 2018-04-15 DIAGNOSIS — R0609 Other forms of dyspnea: Secondary | ICD-10-CM | POA: Diagnosis not present

## 2018-04-15 LAB — BASIC METABOLIC PANEL
Anion gap: 10 (ref 5–15)
BUN: 11 mg/dL (ref 6–20)
CO2: 21 mmol/L — ABNORMAL LOW (ref 22–32)
CREATININE: 0.79 mg/dL (ref 0.44–1.00)
Calcium: 9.2 mg/dL (ref 8.9–10.3)
Chloride: 108 mmol/L (ref 98–111)
GFR calc Af Amer: >60 mL/min (ref >60)
GFR calc non Af Amer: >60 mL/min (ref >60)
Glucose, Bld: 105 mg/dL — ABNORMAL HIGH (ref 70–99)
Potassium: 3.3 mmol/L — ABNORMAL LOW (ref 3.5–5.1)
Sodium: 139 mmol/L (ref 135–145)

## 2018-04-15 LAB — BRAIN NATRIURETIC PEPTIDE: B Natriuretic Peptide: 32 pg/mL (ref 0.0–100.0)

## 2018-04-15 LAB — D-DIMER, QUANTITATIVE: D-Dimer, Quant: <0.27 ug/mL-FEU (ref 0.00–0.50)

## 2018-04-15 MED ORDER — POTASSIUM CHLORIDE ER 10 MEQ PO TBCR: 10.0000 meq | EXTENDED_RELEASE_TABLET | Freq: Two times a day (BID) | ORAL | 6 refills | Status: DC

## 2018-04-15 NOTE — Telephone Encounter (Signed)
Please ensure potassium 10 mEq, new dosage take 1 twice daily #60 6 refills Repeat met 7 in 2 weeks

## 2018-04-15 NOTE — Telephone Encounter (Signed)
Please see the results of her lab work Please find out regarding the potassium Please talk with me regarding this so we can make adjustments in real-time

## 2018-04-15 NOTE — Telephone Encounter (Signed)
Patient states she has an appt for the echo on 04/21/2018 and see's Cardiology appt with Dr.Branch 05/05/2018. She says she is taking her K 10 meq one po Qd.

## 2018-04-15 NOTE — Telephone Encounter (Signed)
Called pt to give cardio appt info - pt wanted to inform Dr. Nicki Reaper that she was unable to get her lab work done yesterday at the hospital  Pt states she was told that "He does this all the time, he knows we close at 4:30 and we are busy with all the ED lab orders"   Pt states after she waited for quite a while, she left  Plans to go back this morning to get them done

## 2018-04-15 NOTE — Telephone Encounter (Signed)
Please advise.These were supposed to have been done STAT. They were ordered stat and green sticker was placed on the order.

## 2018-04-15 NOTE — Telephone Encounter (Signed)
Patient is aware we have increased her K to Bid and to repeat labs in two weeks. Medication increase was sent to [facility] in [place].

## 2018-04-21 ENCOUNTER — Ambulatory Visit (HOSPITAL_COMMUNITY)
Admission: RE | Admit: 2018-04-21 | Discharge: 2018-04-21 | Disposition: A | Discharge status: Home / Self Care | Payer: Medicare Other | Source: Ambulatory Visit | Attending: Family Medicine | Admitting: Family Medicine

## 2018-04-21 DIAGNOSIS — R0609 Other forms of dyspnea: Secondary | ICD-10-CM | POA: Insufficient documentation

## 2018-04-21 DIAGNOSIS — E876 Hypokalemia: Secondary | ICD-10-CM

## 2018-04-21 NOTE — Progress Notes (Signed)
*  PRELIMINARY RESULTS* Echocardiogram 2D Echocardiogram has been performed.  Samuel Germany 04/21/2018, 12:14 PM

## 2018-04-26 ENCOUNTER — Ambulatory Visit (INDEPENDENT_AMBULATORY_CARE_PROVIDER_SITE_OTHER): Payer: Medicare Other | Admitting: Internal Medicine

## 2018-05-02 DIAGNOSIS — E876 Hypokalemia: Secondary | ICD-10-CM | POA: Diagnosis not present

## 2018-05-03 LAB — BASIC METABOLIC PANEL
BUN/Creatinine Ratio: 11 (ref 9–23)
BUN: 10 mg/dL (ref 6–24)
CO2: 21 mmol/L (ref 20–29)
CREATININE: 0.88 mg/dL (ref 0.57–1.00)
Calcium: 9.4 mg/dL (ref 8.7–10.2)
Chloride: 107 mmol/L — ABNORMAL HIGH (ref 96–106)
GFR calc Af Amer: 83 mL/min/{1.73_m2} (ref >59)
GFR calc non Af Amer: 72 mL/min/{1.73_m2} (ref >59)
GLUCOSE: 104 mg/dL — AB (ref 65–99)
Potassium: 3.6 mmol/L (ref 3.5–5.2)
Sodium: 143 mmol/L (ref 134–144)

## 2018-05-05 ENCOUNTER — Ambulatory Visit (INDEPENDENT_AMBULATORY_CARE_PROVIDER_SITE_OTHER): Payer: Medicare Other | Admitting: Cardiology

## 2018-05-05 ENCOUNTER — Encounter

## 2018-05-05 ENCOUNTER — Encounter: Payer: Self-pay | Admitting: Cardiology

## 2018-05-05 VITALS — BP 125/82 | HR 96 | Ht 69.0 in | Wt 230.0 lb

## 2018-05-05 DIAGNOSIS — R0602 Shortness of breath: Secondary | ICD-10-CM

## 2018-05-05 DIAGNOSIS — R0789 Other chest pain: Secondary | ICD-10-CM | POA: Diagnosis not present

## 2018-05-05 NOTE — Patient Instructions (Signed)

## 2018-05-05 NOTE — Progress Notes (Signed)
Clinical Summary Natalie Campos is a 57 y.o.female seen as new consult, referred by Natalie Campos for SOB and chest pain.    1. SOB 03/2018 CXR chronic bronchitic changes - 04/2018 echo LVEF 16-60%, normal diastolic function. 03/2018 ER visit with chest pain and SOB, cough. N/V. Had started ocrevus for her MS prior - D-dimer negative, trop neg  - sudden epigastric pain radiating under both breasts while sleeping. Sat up immediately, ran to bathroom and vomitied x 3. Then severe pain left breast, pressure and aching. +SOB. Waited 4 days to be seen. Constant x 4 days,  - + SOB walking short distances.  - since that time mild intermittent symptoms - breathing has improved - no recent edema   2. Hx of PUD    3. Multiple sclerosis - followed by Natalie Arsenio Katz  Past Medical History:  Diagnosis Date  . Anemia   . Barrett's esophagus   . Bulging discs   . Depression   . DJD (degenerative joint disease)   . Dog bite(E906.0) july 29,2011  . Eczema   . Emphysema   . GERD (gastroesophageal reflux disease)   . High triglycerides   . Hyperlipidemia   . Hypertension   . IBS (irritable bowel syndrome)    contipation predominant   . Iron deficiency anemia   . Multiple sclerosis ([place]) 1990   Dx in 1990 most recent hospitalization for a flareis in 2011   . Obesity      Allergies  Allergen Reactions  . Ace Inhibitors   . Atenolol Anaphylaxis  . Penicillins     Has patient had a PCN reaction causing immediate rash, facial/tongue/throat swelling, SOB or lightheadedness with hypotension: No Has patient had a PCN reaction causing severe rash involving mucus membranes or skin necrosis: No Has patient had a PCN reaction that required hospitalization: No Has patient had a PCN reaction occurring within the last 10 years: No If all of the above answers are "NO", then may proceed with Cephalosporin use.      Current Outpatient Medications  Medication Sig Dispense Refill  . amLODipine  (NORVASC) 5 MG tablet TAKE 1 TABLET BY MOUTH ONCE DAILY. 30 tablet 0  . aspirin (ASPIRIN LOW DOSE) 81 MG EC tablet Take 81 mg by mouth 2 (two) times daily. One tablet by mouth once daily    . baclofen (LIORESAL) 10 MG tablet Take 10 mg by mouth 4 (four) times daily.     . benzonatate (TESSALON) 100 MG capsule Take 1 capsule (100 mg total) by mouth every 8 (eight) hours. 21 capsule 0  . Cholecalciferol (VITAMIN D3) 3000 UNITS TABS Take 1 tablet by mouth daily. 50,000 u weekly    . diazepam (VALIUM) 10 MG tablet Take 10 mg by mouth 3 (three) times daily as needed. One tablet by mouth 4 times daily    . docusate sodium (COLACE) 100 MG capsule Take 300 mg by mouth daily. One caplet by mouth  Two times a day    . DULoxetine (CYMBALTA) 30 MG capsule Take 60 mg by mouth daily.     . famotidine (PEPCID) 20 MG tablet Take 1 tablet (20 mg total) by mouth at bedtime. (Patient not taking: Reported on 04/14/2018)    . ferrous sulfate (FERROUSUL) 325 (65 FE) MG tablet Take 1 tablet (325 mg total) by mouth 2 (two) times daily with a meal.  3  . fish oil-omega-3 fatty acids 1000 MG capsule Take by mouth daily. One cap by  mouth daily       . gabapentin (NEURONTIN) 600 MG tablet Take 600 mg by mouth 3 (three) times daily.     Marland Kitchen HYDROmorphone (DILAUDID) 4 MG tablet Take 4 mg by mouth every 12 (twelve) hours as needed for severe pain.    Marland Kitchen losartan (COZAAR) 100 MG tablet TAKE 1 TABLET ONCE DAILY. 30 tablet 1  . modafinil (PROVIGIL) 200 MG tablet 200 mg 2 (two) times daily.     . mometasone (ELOCON) 0.1 % cream Apply qd prn 90 g 1  . multivitamin (THERAGRAN) per tablet Take 1 tablet by mouth daily. One tablet by mouth once daily     . Ocrelizumab (OCREVUS IV) Inject into the vein. Infused every 6 months.    Marland Kitchen olopatadine (PATANOL) 0.1 % ophthalmic solution PLACE 1 DROP INTO BOTH EYES AS NEEDED. 5 mL 5  . omeprazole (PRILOSEC) 40 MG capsule Take 1 capsule (40 mg total) by mouth daily. 90 capsule 3  . oxybutynin  (DITROPAN-XL) 5 MG 24 hr tablet Take 5 mg by mouth at bedtime.    . polyethylene glycol powder (GLYCOLAX/MIRALAX) powder Take 25.5 g by mouth daily. 765 g 11  . potassium chloride (K-DUR) 10 MEQ tablet Take 1 tablet (10 mEq total) by mouth 2 (two) times daily. 60 tablet 6  . topiramate (TOPAMAX) 100 MG tablet Take 100 mg by mouth daily.    . traMADol (ULTRAM) 50 MG tablet Take 100 mg by mouth 2 (two) times daily.     Marland Kitchen triamcinolone cream (KENALOG) 0.1 % Apply 1 application topically 2 (two) times daily. 240 g 4  . vitamin E 1000 UNIT capsule Take 1,000 Units by mouth daily.     Current Facility-Administered Medications  Medication Dose Route Frequency Provider Last Rate Last Dose  . Influenza (>/= 3 years) inactive virus vaccine (FLVIRIN/FLUZONE) injection SUSP 0.5 mL  0.5 mL Intramuscular Once Buelah Manis, Modena Nunnery, MD         Past Surgical History:  Procedure Laterality Date  . BIOPSY  01/28/2011   Procedure: BIOPSY;  Surgeon: Rogene Houston, MD;  Location: [facility];  Service: Endoscopy;  Laterality: N/A;  . BIOPSY N/A 08/25/2013   Procedure: BIOPSY;  Surgeon: Rogene Houston, MD;  Location: [facility];  Service: Endoscopy;  Laterality: N/A;  . CHOLECYSTECTOMY  2010   Natalie. Anthony Sar   . COLONOSCOPY WITH PROPOFOL N/A 08/25/2013   Procedure: COLONOSCOPY WITH PROPOFOL;  Surgeon: Rogene Houston, MD;  Location: [facility];  Service: Endoscopy;  Laterality: N/A;  in cecum at 0811 out at 0822 = 11 minutes  . ESOPHAGOGASTRODUODENOSCOPY  01/28/2011   Procedure: ESOPHAGOGASTRODUODENOSCOPY (EGD);  Surgeon: Rogene Houston, MD;  Location: [facility];  Service: Endoscopy;  Laterality: N/A;  1:00  . ESOPHAGOGASTRODUODENOSCOPY (EGD) WITH PROPOFOL N/A 08/25/2013   Procedure: ESOPHAGOGASTRODUODENOSCOPY (EGD) WITH PROPOFOL;  Surgeon: Rogene Houston, MD;  Location: [facility];  Service: Endoscopy;  Laterality: N/A;  . WISDOM TOOTH EXTRACTION       Allergies  Allergen Reactions  . Ace Inhibitors   . Atenolol  Anaphylaxis  . Penicillins     Has patient had a PCN reaction causing immediate rash, facial/tongue/throat swelling, SOB or lightheadedness with hypotension: No Has patient had a PCN reaction causing severe rash involving mucus membranes or skin necrosis: No Has patient had a PCN reaction that required hospitalization: No Has patient had a PCN reaction occurring within the last 10 years: No If all of the above answers  are "NO", then may proceed with Cephalosporin use.       No family history on file.   Social History Ms. Sanderford reports that she quit smoking about 10 years ago. Her smoking use included cigarettes. She has a 45.00 pack-year smoking history. She has never used smokeless tobacco. Ms. Gotay reports no history of alcohol use.   Review of Systems CONSTITUTIONAL: No weight loss, fever, chills, weakness or fatigue.  HEENT: Eyes: No visual loss, blurred vision, double vision or yellow sclerae.No hearing loss, sneezing, congestion, runny nose or sore throat.  SKIN: No rash or itching.  CARDIOVASCULAR: per hpi RESPIRATORY: No shortness of breath, cough or sputum.  GASTROINTESTINAL: No anorexia, nausea, vomiting or diarrhea. No abdominal pain or blood.  GENITOURINARY: No burning on urination, no polyuria NEUROLOGICAL: No headache, dizziness, syncope, paralysis, ataxia, numbness or tingling in the extremities. No change in bowel or bladder control.  MUSCULOSKELETAL: No muscle, back pain, joint pain or stiffness.  LYMPHATICS: No enlarged nodes. No history of splenectomy.  PSYCHIATRIC: No history of depression or anxiety.  ENDOCRINOLOGIC: No reports of sweating, cold or heat intolerance. No polyuria or polydipsia.  Marland Kitchen   Physical Examination Vitals:   05/05/18 1019  BP: 125/82  Pulse: 96  SpO2: 93%   Vitals:   05/05/18 1019  Weight: 230 lb (104.3 kg)  Height: 5\' 9"  (1.753 m)    Gen: resting comfortably, no acute distress HEENT: no scleral icterus, pupils equal round  and reactive, no palptable cervical adenopathy,  CV: RRR, 2/6 systoic murmur rusb, no jvd Resp: Clear to auscultation bilaterally GI: abdomen is soft, non-tender, non-distended, normal bowel sounds, no hepatosplenomegaly MSK: extremities are warm, no edema.  Skin: warm, no rash Neuro:  no focal deficits Psych: appropriate affect   Diagnostic Studies 04/2018 echo Study Conclusions  - Left ventricle: The cavity size was normal. Wall thickness was   normal. Systolic function was normal. The estimated ejection   fraction was in the range of 55% to 60%. Wall motion was normal;   there were no regional wall motion abnormalities. Left   ventricular diastolic function parameters were normal. - Right atrium: Central venous pressure (est): 3 mm Hg. - Atrial septum: No defect or patent foramen ovale was identified. - Tricuspid valve: There was physiologic regurgitation. - Pulmonary arteries: Systolic pressure could not be accurately   estimated. - Pericardium, extracardiac: There was no pericardial effusion.   Assessment and Plan  1. Chest pain/SOB - chest pain is noncardiac in description. Lasted 4 days consntant, along with epigastric pain and N/V. EKG and trop normal during ER evaluation, benign echo - no further cardiac testing planned at this time, no strong indication for stress testing    F/u 6 months.     Arnoldo Lenis, M.D.

## 2018-05-12 ENCOUNTER — Other Ambulatory Visit: Payer: Self-pay

## 2018-05-12 MED ORDER — AMLODIPINE BESYLATE 5 MG PO TABS: 5.0000 mg | ORAL_TABLET | Freq: Every day | ORAL | 0 refills | Status: DC

## 2018-05-16 ENCOUNTER — Encounter: Payer: Self-pay | Admitting: Family Medicine

## 2018-05-16 ENCOUNTER — Ambulatory Visit (INDEPENDENT_AMBULATORY_CARE_PROVIDER_SITE_OTHER): Payer: Medicare Other | Admitting: Family Medicine

## 2018-05-16 VITALS — Temp 98.6°F | Wt 227.8 lb

## 2018-05-16 DIAGNOSIS — J111 Influenza due to unidentified influenza virus with other respiratory manifestations: Secondary | ICD-10-CM

## 2018-05-16 DIAGNOSIS — J329 Chronic sinusitis, unspecified: Secondary | ICD-10-CM

## 2018-05-16 MED ORDER — DOXYCYCLINE HYCLATE 100 MG PO TABS: ORAL_TABLET | ORAL | 0 refills | Status: DC

## 2018-05-16 MED ORDER — OSELTAMIVIR PHOSPHATE 75 MG PO CAPS: ORAL_CAPSULE | ORAL | 0 refills | Status: DC

## 2018-05-16 NOTE — Progress Notes (Signed)
   Subjective:    Patient ID: Natalie Campos, female    DOB: 01/19/1961, 58 y.o.   MRN: [id number]  Cough  This is a new problem. The current episode started 1 to 4 weeks ago. The cough is non-productive. Associated symptoms include ear pain, headaches, a sore throat and wheezing. Treatments tried: over the counter. The treatment provided no relief.    Two weeks ago pt had infxn develop  Pt is on ocravis,   Pt has low immune system  Upper resp issues   Pt had sore throat and ear ache    Now having bad headache and eyes hurt   No stomach issue s   Pt has received flu shot    No fever   soe sig h a  Eyes ainful   Muscle and joint aches  More fatigue    Cough got substantially worse  Pt had raxn several days post ocravis infusion     Review of Systems  HENT: Positive for ear pain and sore throat.   Respiratory: Positive for cough and wheezing.   Neurological: Positive for headaches.       Objective:   Physical Exam Alert active, moderate malaise.  HEENT moderate nasal congestion pharynx normal neck supple intermittent cough during exam lungs no crackles no wheezes no tachypnea       Assessment & Plan:  Impression subacute bronchitis/rhinosinusitis plus acute illness which sounds like attenuated flu.  Discussed at length.  Will cover with both Doxy twice daily 10 days and Tamiflu twice daily for 5 days.  Symptom l care discussed.  Patient definitely immunocompromise with chronic MS injections.  Warning signs discussed

## 2018-05-26 ENCOUNTER — Telehealth: Payer: Self-pay | Admitting: Family Medicine

## 2018-05-26 ENCOUNTER — Other Ambulatory Visit: Payer: Self-pay | Admitting: Family Medicine

## 2018-05-26 MED ORDER — FLUCONAZOLE 150 MG PO TABS: ORAL_TABLET | ORAL | 0 refills | Status: DC

## 2018-05-26 MED ORDER — AZITHROMYCIN 250 MG PO TABS: ORAL_TABLET | ORAL | 0 refills | Status: DC

## 2018-05-26 NOTE — Telephone Encounter (Signed)
zpk which would cover persistent infxn and any uti, but with itching hould consider possiblity for yeast infxn, rec adding diflucan 150 numb 2 one p o three d apart

## 2018-05-26 NOTE — Telephone Encounter (Signed)
Medication sent in and patient is aware.  

## 2018-05-26 NOTE — Telephone Encounter (Signed)
Pt seen 05/16/18 by Dr.Steve for the flu and sinusitis, finished antibiotic, and no better. Requesting another Medication and medication for UTI. Advise.   Pharmacy:  [place]

## 2018-05-26 NOTE — Telephone Encounter (Signed)
Contacted patient and informed patient that she would need to be seen for the UTI. Pt states that she is unable to come in due to having car issues and her husband is at work. Pt states that she is having itching, burning, frequency since last night.

## 2018-06-13 ENCOUNTER — Other Ambulatory Visit (INDEPENDENT_AMBULATORY_CARE_PROVIDER_SITE_OTHER): Payer: Self-pay | Admitting: Internal Medicine

## 2018-06-13 ENCOUNTER — Other Ambulatory Visit: Payer: Self-pay | Admitting: Family Medicine

## 2018-06-28 ENCOUNTER — Ambulatory Visit (INDEPENDENT_AMBULATORY_CARE_PROVIDER_SITE_OTHER): Payer: Medicare Other | Admitting: Family Medicine

## 2018-06-28 VITALS — BP 132/86 | HR 86 | Ht 69.0 in | Wt 228.6 lb

## 2018-06-28 DIAGNOSIS — R0609 Other forms of dyspnea: Secondary | ICD-10-CM

## 2018-06-28 DIAGNOSIS — I10 Essential (primary) hypertension: Secondary | ICD-10-CM | POA: Diagnosis not present

## 2018-06-28 DIAGNOSIS — R0600 Dyspnea, unspecified: Secondary | ICD-10-CM

## 2018-06-28 MED ORDER — AMLODIPINE BESYLATE 5 MG PO TABS: 5.0000 mg | ORAL_TABLET | Freq: Every day | ORAL | 1 refills | Status: DC

## 2018-06-28 MED ORDER — LOSARTAN POTASSIUM 100 MG PO TABS: 100.0000 mg | ORAL_TABLET | Freq: Every day | ORAL | 1 refills | Status: DC

## 2018-06-28 NOTE — Patient Instructions (Signed)
DASH Eating Plan  DASH stands for "Dietary Approaches to Stop Hypertension." The DASH eating plan is a healthy eating plan that has been shown to reduce high blood pressure (hypertension). It may also reduce your risk for type 2 diabetes, heart disease, and stroke. The DASH eating plan may also help with weight loss.  What are tips for following this plan?    General guidelines   Avoid eating more than 2,300 mg (milligrams) of salt (sodium) a day. If you have hypertension, you may need to reduce your sodium intake to 1,500 mg a day.   Limit alcohol intake to no more than 1 drink a day for nonpregnant women and 2 drinks a day for men. One drink equals 12 oz of beer, 5 oz of wine, or 1 oz of hard liquor.   Work with your health care provider to maintain a healthy body weight or to lose weight. Ask what an ideal weight is for you.   Get at least 30 minutes of exercise that causes your heart to beat faster (aerobic exercise) most days of the week. Activities may include walking, swimming, or biking.   Work with your health care provider or diet and nutrition specialist (dietitian) to adjust your eating plan to your individual calorie needs.  Reading food labels     Check food labels for the amount of sodium per serving. Choose foods with less than 5 percent of the Daily Value of sodium. Generally, foods with less than 300 mg of sodium per serving fit into this eating plan.   To find whole grains, look for the word "whole" as the first word in the ingredient list.  Shopping   Buy products labeled as "low-sodium" or "no salt added."   Buy fresh foods. Avoid canned foods and premade or frozen meals.  Cooking   Avoid adding salt when cooking. Use salt-free seasonings or herbs instead of table salt or sea salt. Check with your health care provider or pharmacist before using salt substitutes.   Do not fry foods. Cook foods using healthy methods such as baking, boiling, grilling, and broiling instead.   Cook with  heart-healthy oils, such as olive, canola, soybean, or sunflower oil.  Meal planning   Eat a balanced diet that includes:  ? 5 or more servings of fruits and vegetables each day. At each meal, try to fill half of your plate with fruits and vegetables.  ? Up to 6-8 servings of whole grains each day.  ? Less than 6 oz of lean meat, poultry, or fish each day. A 3-oz serving of meat is about the same size as a deck of cards. One egg equals 1 oz.  ? 2 servings of low-fat dairy each day.  ? A serving of nuts, seeds, or beans 5 times each week.  ? Heart-healthy fats. Healthy fats called Omega-3 fatty acids are found in foods such as flaxseeds and coldwater fish, like sardines, salmon, and mackerel.   Limit how much you eat of the following:  ? Canned or prepackaged foods.  ? Food that is high in trans fat, such as fried foods.  ? Food that is high in saturated fat, such as fatty meat.  ? Sweets, desserts, sugary drinks, and other foods with added sugar.  ? Full-fat dairy products.   Do not salt foods before eating.   Try to eat at least 2 vegetarian meals each week.   Eat more home-cooked food and less restaurant, buffet, and fast food.     When eating at a restaurant, ask that your food be prepared with less salt or no salt, if possible.  What foods are recommended?  The items listed may not be a complete list. Talk with your dietitian about what dietary choices are best for you.  Grains  Whole-grain or whole-wheat bread. Whole-grain or whole-wheat pasta. Brown rice. Oatmeal. Quinoa. Bulgur. Whole-grain and low-sodium cereals. Pita bread. Low-fat, low-sodium crackers. Whole-wheat flour tortillas.  Vegetables  Fresh or frozen vegetables (raw, steamed, roasted, or grilled). Low-sodium or reduced-sodium tomato and vegetable juice. Low-sodium or reduced-sodium tomato sauce and tomato paste. Low-sodium or reduced-sodium canned vegetables.  Fruits  All fresh, dried, or frozen fruit. Canned fruit in natural juice (without  added sugar).  Meat and other protein foods  Skinless chicken or turkey. Ground chicken or turkey. Pork with fat trimmed off. Fish and seafood. Egg whites. Dried beans, peas, or lentils. Unsalted nuts, nut butters, and seeds. Unsalted canned beans. Lean cuts of beef with fat trimmed off. Low-sodium, lean deli meat.  Dairy  Low-fat (1%) or fat-free (skim) milk. Fat-free, low-fat, or reduced-fat cheeses. Nonfat, low-sodium ricotta or cottage cheese. Low-fat or nonfat yogurt. Low-fat, low-sodium cheese.  Fats and oils  Soft margarine without trans fats. Vegetable oil. Low-fat, reduced-fat, or light mayonnaise and salad dressings (reduced-sodium). Canola, safflower, olive, soybean, and sunflower oils. Avocado.  Seasoning and other foods  Herbs. Spices. Seasoning mixes without salt. Unsalted popcorn and pretzels. Fat-free sweets.  What foods are not recommended?  The items listed may not be a complete list. Talk with your dietitian about what dietary choices are best for you.  Grains  Baked goods made with fat, such as croissants, muffins, or some breads. Dry pasta or rice meal packs.  Vegetables  Creamed or fried vegetables. Vegetables in a cheese sauce. Regular canned vegetables (not low-sodium or reduced-sodium). Regular canned tomato sauce and paste (not low-sodium or reduced-sodium). Regular tomato and vegetable juice (not low-sodium or reduced-sodium). Pickles. Olives.  Fruits  Canned fruit in a light or heavy syrup. Fried fruit. Fruit in cream or butter sauce.  Meat and other protein foods  Fatty cuts of meat. Ribs. Fried meat. Bacon. Sausage. Bologna and other processed lunch meats. Salami. Fatback. Hotdogs. Bratwurst. Salted nuts and seeds. Canned beans with added salt. Canned or smoked fish. Whole eggs or egg yolks. Chicken or turkey with skin.  Dairy  Whole or 2% milk, cream, and half-and-half. Whole or full-fat cream cheese. Whole-fat or sweetened yogurt. Full-fat cheese. Nondairy creamers. Whipped toppings.  Processed cheese and cheese spreads.  Fats and oils  Butter. Stick margarine. Lard. Shortening. Ghee. Bacon fat. Tropical oils, such as coconut, palm kernel, or palm oil.  Seasoning and other foods  Salted popcorn and pretzels. Onion salt, garlic salt, seasoned salt, table salt, and sea salt. Worcestershire sauce. Tartar sauce. Barbecue sauce. Teriyaki sauce. Soy sauce, including reduced-sodium. Steak sauce. Canned and packaged gravies. Fish sauce. Oyster sauce. Cocktail sauce. Horseradish that you find on the shelf. Ketchup. Mustard. Meat flavorings and tenderizers. Bouillon cubes. Hot sauce and Tabasco sauce. Premade or packaged marinades. Premade or packaged taco seasonings. Relishes. Regular salad dressings.  Where to find more information:   National Heart, Lung, and Blood Institute: www.nhlbi.nih.gov   American Heart Association: www.heart.org  Summary   The DASH eating plan is a healthy eating plan that has been shown to reduce high blood pressure (hypertension). It may also reduce your risk for type 2 diabetes, heart disease, and stroke.   With the   DASH eating plan, you should limit salt (sodium) intake to 2,300 mg a day. If you have hypertension, you may need to reduce your sodium intake to 1,500 mg a day.   When on the DASH eating plan, aim to eat more fresh fruits and vegetables, whole grains, lean proteins, low-fat dairy, and heart-healthy fats.   Work with your health care provider or diet and nutrition specialist (dietitian) to adjust your eating plan to your individual calorie needs.  This information is not intended to replace advice given to you by your health care provider. Make sure you discuss any questions you have with your health care provider.  Document Released: 04/16/2011 Document Revised: 04/20/2016 Document Reviewed: 04/20/2016  [employer]vier Interactive Patient Education  2019 [employer]

## 2018-06-28 NOTE — Progress Notes (Signed)
Subjective:    Patient ID: Natalie Campos, female    DOB: 01-22-1961, 58 y.o.   MRN: [id number]  Hypertension  This is a chronic problem. The current episode started more than 1 year ago. Associated symptoms include shortness of breath. Pertinent negatives include no chest pain or headaches. Risk factors for coronary artery disease include post-menopausal state and sedentary lifestyle (norvasc and cozaar). Treatments tried: cozaar and norvasc. There are no compliance problems.   reports compliance with meds, does not regularly check home BPs, but when she does she states they are good. Denies adverse effects from meds.   Needs refills on bp meds.  Recent MS flare. Sees specialist Thursday.   Still having some dyspnea on exertion, like when she is showering or cleaning. States has improved since was seen for this in December. No chest pain.   Pt also reports problems with urinary incontinence, has been evaluated by neurology and urology for this. States she does not want to be on any medications for this and is unable to go to PT in [place] as urologist recommended.   Review of Systems  Constitutional: Negative for chills and fever.  Eyes: Negative for visual disturbance.  Respiratory: Positive for shortness of breath. Negative for wheezing.   Cardiovascular: Negative for chest pain and leg swelling.  Neurological: Negative for headaches.       Objective:   Physical Exam Vitals signs and nursing note reviewed.  Constitutional:      General: She is not in acute distress.    Appearance: She is not toxic-appearing.  HENT:     Head: Normocephalic and atraumatic.  Neck:     Musculoskeletal: Neck supple.  Cardiovascular:     Rate and Rhythm: Normal rate and regular rhythm.     Heart sounds: Normal heart sounds.  Pulmonary:     Effort: Pulmonary effort is normal. No respiratory distress.     Breath sounds: Normal breath sounds. No wheezing or rales.  Musculoskeletal:     Right lower  leg: No edema.     Left lower leg: No edema.  Skin:    General: Skin is warm and dry.  Neurological:     Mental Status: She is alert and oriented to person, place, and time.  Psychiatric:        Behavior: Behavior normal.           Assessment & Plan:  1. Essential hypertension Pt here for f/u of her high BP, reports compliant with current medications. BP well controlled. Unable to exercise d/t MS and chronic back problems. Will refill medications. Recent metabolic panel normal. Has not had Lipid and Liver fx testing in a few years, will order these labs. Pt declines routine HIV testing per CDC guidelines, states she was tested when she was pregnant many years ago and was negative, denies any risk factors. Will notify pt of results. Recommend f/u in 6 months.   2. DOE (dyspnea on exertion) Pt reports continued dyspnea with regular activities such as getting dressed or showering. States has improved since being seen for this back in December. X-ray from November 2019 normal. Was evaluated by cardiology and did not feel this was cardiac in nature. Will obtain PFT pre- and post to determine if pulmonary in nature given patient's smoking hx, but could very well be d/t deconditioning. Will notify pt of results. May need to f/u sooner based on results.  Recommended that patient follow up with her urologist regarding continued problems with  urinary incontinence to see what other treatment options they may suggest.   Dr. Sallee Lange was consulted on this case and is in agreement with the above treatment plan.  25 minutes was spent with the patient.  This statement verifies that 25 minutes was indeed spent with the patient.  More than 50% of this visit-total duration of the visit-was spent in counseling and coordination of care. The issues that the patient came in for today as reflected in the diagnosis (s) please refer to documentation for further details.

## 2018-06-29 LAB — LIPID PANEL
Chol/HDL Ratio: 3 ratio (ref 0.0–4.4)
Cholesterol, Total: 204 mg/dL — ABNORMAL HIGH (ref 100–199)
HDL: 68 mg/dL (ref >39)
LDL Calculated: 94 mg/dL (ref 0–99)
Triglycerides: 208 mg/dL — ABNORMAL HIGH (ref 0–149)
VLDL Cholesterol Cal: 42 mg/dL — ABNORMAL HIGH (ref 5–40)

## 2018-06-29 LAB — HEPATIC FUNCTION PANEL
ALBUMIN: 4.6 g/dL (ref 3.8–4.9)
ALT: 14 IU/L (ref 0–32)
AST: 17 IU/L (ref 0–40)
Alkaline Phosphatase: 140 IU/L — ABNORMAL HIGH (ref 39–117)
Bilirubin Total: 0.3 mg/dL (ref 0.0–1.2)
Bilirubin, Direct: 0.09 mg/dL (ref 0.00–0.40)
Total Protein: 7.4 g/dL (ref 6.0–8.5)

## 2018-06-30 ENCOUNTER — Other Ambulatory Visit: Payer: Self-pay

## 2018-06-30 NOTE — Patient Outreach (Signed)
[facility]) Care Management  06/30/2018  Natalie Campos 1960/05/21 [id number]   Medication Adherence call to [name] patient did not answer patient is due on Losartan 100 mg. [facility] said the prescription is ready but patient has not pick up. [name] is showing past due under [place].   [facility] Direct Dial [phone]  Fax [phone] [email or web address]

## 2018-07-05 DIAGNOSIS — Z79899 Other long term (current) drug therapy: Secondary | ICD-10-CM | POA: Diagnosis not present

## 2018-07-05 DIAGNOSIS — G894 Chronic pain syndrome: Secondary | ICD-10-CM | POA: Diagnosis not present

## 2018-07-05 DIAGNOSIS — G35 Multiple sclerosis: Secondary | ICD-10-CM | POA: Diagnosis not present

## 2018-07-05 DIAGNOSIS — R5381 Other malaise: Secondary | ICD-10-CM | POA: Diagnosis not present

## 2018-07-12 ENCOUNTER — Ambulatory Visit (INDEPENDENT_AMBULATORY_CARE_PROVIDER_SITE_OTHER): Payer: Medicare Other | Admitting: Internal Medicine

## 2018-07-12 ENCOUNTER — Encounter (INDEPENDENT_AMBULATORY_CARE_PROVIDER_SITE_OTHER): Payer: Self-pay | Admitting: Internal Medicine

## 2018-07-12 VITALS — BP 139/82 | HR 101 | Temp 98.4°F | Resp 18 | Ht 69.0 in | Wt 229.6 lb

## 2018-07-12 DIAGNOSIS — K219 Gastro-esophageal reflux disease without esophagitis: Secondary | ICD-10-CM

## 2018-07-12 DIAGNOSIS — R1319 Other dysphagia: Secondary | ICD-10-CM

## 2018-07-12 DIAGNOSIS — K2270 Barrett's esophagus without dysplasia: Secondary | ICD-10-CM

## 2018-07-12 DIAGNOSIS — R1310 Dysphagia, unspecified: Secondary | ICD-10-CM | POA: Diagnosis not present

## 2018-07-12 MED ORDER — FAMOTIDINE 20 MG PO TABS: 20.0000 mg | ORAL_TABLET | Freq: Every day | ORAL | 3 refills | Status: DC

## 2018-07-12 NOTE — Progress Notes (Signed)
Presenting complaint;  Follow-up for chronic GERD. Patient complains of dysphagia.  Subjective:  Patient is 58 year old Caucasian female who has chronic GERD complicated by short segment Barrett's esophagus as well as chronic constipation who is here for scheduled visit.  She was last seen on 10/26/2017. She says she is doing well as far as her heartburn is concerned.  She rarely has an episode.  She feels present combination is working very well.  She continues to complain of intermittent dysphagia.  She reports having difficulty when she was eating [place] food and could not swallow rice.  It eventually went down.  She has not had an episode of impaction and regurgitation.  She does not feel it is to the point that further evaluation or dilation should be undertaken. She says 2020 has not been a good year so far.  2 months ago she was treated for sinus infection and flu. She also reports an episode of epigastric and sternal pain on 03/21/2018.  Symptoms occurred after evening meal.  She vomited once.  She did not have hematemesis melena or fever.  She went to emergency room 3 days later and pain was felt to be noncardiac.  Since then she has had few mild episodes.  She states his bowels remain irregular.  When she is constipated she takes polyethylene glycol and then may have diarrhea.  She has not experienced rectal bleeding. She states she has developed psoriasis resulting from [place].  She is receiving infusion every 6 months. She is on Dilaudid every day for neuropathy and pain in both lower extremities.   Current Medications: Outpatient Encounter Medications as of 07/12/2018  Medication Sig  . amLODipine (NORVASC) 5 MG tablet Take 1 tablet (5 mg total) by mouth daily.  Marland Kitchen aspirin (ASPIRIN LOW DOSE) 81 MG EC tablet Take 81 mg by mouth 2 (two) times daily. One tablet by mouth once daily  . baclofen (LIORESAL) 10 MG tablet Take 10 mg by mouth 4 (four) times daily.   . Cholecalciferol (VITAMIN  D3) 3000 UNITS TABS Take 1 tablet by mouth daily. 50,000 u weekly  . diazepam (VALIUM) 10 MG tablet Take 10 mg by mouth 3 (three) times daily as needed. One tablet by mouth 4 times daily  . docusate sodium (COLACE) 100 MG capsule Take 300 mg by mouth daily. One caplet by mouth  Two times a day  . DULoxetine (CYMBALTA) 30 MG capsule Take 60 mg by mouth daily.   . famotidine (PEPCID) 20 MG tablet Take 1 tablet (20 mg total) by mouth at bedtime.  . ferrous sulfate (FERROUSUL) 325 (65 FE) MG tablet Take 1 tablet (325 mg total) by mouth 2 (two) times daily with a meal.  . fish oil-omega-3 fatty acids 1000 MG capsule Take by mouth daily. One cap by mouth daily     . gabapentin (NEURONTIN) 600 MG tablet Take 600 mg by mouth 3 (three) times daily.   Marland Kitchen HYDROmorphone (DILAUDID) 4 MG tablet Take 4 mg by mouth every 12 (twelve) hours as needed for severe pain.  Marland Kitchen losartan (COZAAR) 100 MG tablet Take 1 tablet (100 mg total) by mouth daily.  . modafinil (PROVIGIL) 200 MG tablet 200 mg 2 (two) times daily.   . mometasone (ELOCON) 0.1 % cream Apply qd prn  . multivitamin (THERAGRAN) per tablet Take 1 tablet by mouth daily. One tablet by mouth once daily   . Ocrelizumab (OCREVUS IV) Inject into the vein. Infused every 6 months.  Marland Kitchen olopatadine (PATANOL) 0.1 %  ophthalmic solution PLACE 1 DROP INTO BOTH EYES AS NEEDED.  Marland Kitchen omeprazole (PRILOSEC) 40 MG capsule TAKE 1 CAPSULE BY MOUTH ONCE DAILY.  Marland Kitchen oxybutynin (DITROPAN-XL) 5 MG 24 hr tablet Take 5 mg by mouth at bedtime.  . polyethylene glycol powder (GLYCOLAX/MIRALAX) powder Take 25.5 g by mouth daily.  . potassium chloride (K-DUR) 10 MEQ tablet Take 1 tablet (10 mEq total) by mouth 2 (two) times daily.  Marland Kitchen topiramate (TOPAMAX) 100 MG tablet Take 100 mg by mouth daily.  . traMADol (ULTRAM) 50 MG tablet Take 100 mg by mouth 2 (two) times daily.   Marland Kitchen triamcinolone cream (KENALOG) 0.1 % Apply 1 application topically 2 (two) times daily.  . vitamin E 1000 UNIT capsule  Take 1,000 Units by mouth daily.   Facility-Administered Encounter Medications as of 07/12/2018  Medication  . Influenza (>/= 3 years) inactive virus vaccine (FLVIRIN/FLUZONE) injection SUSP 0.5 mL     Objective: Blood pressure 139/82, pulse (!) 101, temperature 98.4 F (36.9 C), temperature source Oral, resp. rate 18, height _0  (1.753 m), weight 229 lb 9.6 oz (104.1 kg). Patient is alert and in no acute distress. Conjunctiva is pink. Sclera is nonicteric Oropharyngeal mucosa is normal. No neck masses or thyromegaly noted. Cardiac exam with regular rhythm normal S1 and S2. No murmur or gallop noted. Lungs are clear to auscultation. Abdomen is full.  It is soft with mild midepigastric tenderness.  No organomegaly or masses. No LE edema or clubbing noted.  Labs/studies Results:   CBC Latest Ref Rng & Units 03/25/2018 08/11/2017 07/15/2016  WBC 4.0 - 10.5 K/uL 5.1 4.7 4.4  Hemoglobin 12.0 - 15.0 g/dL 14.4 14.2 16.0(H)  Hematocrit 36.0 - 46.0 % 42.4 41.9 47.3(H)  Platelets 150 - 400 K/uL 225 237 205    CMP Latest Ref Rng & Units 06/28/2018 05/02/2018 04/15/2018  Glucose 65 - 99 mg/dL - 104(H) 105(H)  BUN 6 - 24 mg/dL - 10 11  Creatinine 0.57 - 1.00 mg/dL - 0.88 0.79  Sodium 134 - 144 mmol/L - 143 139  Potassium 3.5 - 5.2 mmol/L - 3.6 3.3(L)  Chloride 96 - 106 mmol/L - 107(H) 108  CO2 20 - 29 mmol/L - 21 21(L)  Calcium 8.7 - 10.2 mg/dL - 9.4 9.2  Total Protein 6.0 - 8.5 g/dL 7.4 - -  Total Bilirubin 0.0 - 1.2 mg/dL 0.3 - -  Alkaline Phos 39 - 117 IU/L 140(H) - -  AST 0 - 40 IU/L 17 - -  ALT 0 - 32 IU/L 14 - -    Hepatic Function Latest Ref Rng & Units 06/28/2018 03/25/2018 07/15/2016  Total Protein 6.0 - 8.5 g/dL 7.4 7.2 7.9  Albumin 3.8 - 4.9 g/dL 4.6 3.9 5.0  AST 0 - 40 IU/L _1 ALT 0 - 32 IU/L _2 Alk Phosphatase 39 - 117 IU/L 140(H) 110 100  Total Bilirubin 0.0 - 1.2 mg/dL 0.3 0.8 0.6  Bilirubin, Direct 0.00 - 0.40 mg/dL 0.09 - 0.16      Assessment:  #1.   Chronic GERD complicated by short segment Barrett's esophagus.  She is doing well with PPI in the morning and H2B at bedtime.  There is room for improvement in dietary measures.  Last EGD was in April 2015.  #2.  Esophageal dysphagia.  I suspect esophageal motility disorder.  If dysphagia worsens will proceed with barium pill esophagogram.  #3.  History of epigastric pain.  It appears she may have had  food related symptoms.  She is status post cholecystectomy.  LFTs were normal.  If pain recurs would consider repeating LFTs.   Plan:  Antireflux measures reinforced. Patient will call office if dysphagia worsens or she experience epigastric pain. She will continue dexlansoprazole 60 mg in the morning and famotidine 20 mg at bedtime. Office visit in 6 months.

## 2018-07-12 NOTE — Patient Instructions (Signed)
Please call office if swallowing difficulty becomes more frequent.

## 2018-07-27 ENCOUNTER — Encounter (HOSPITAL_COMMUNITY): Payer: Medicare Other

## 2018-08-17 ENCOUNTER — Encounter (HOSPITAL_COMMUNITY): Payer: Medicare Other

## 2018-09-14 ENCOUNTER — Telehealth: Payer: Self-pay | Admitting: Family Medicine

## 2018-09-14 ENCOUNTER — Other Ambulatory Visit: Payer: Self-pay

## 2018-09-14 ENCOUNTER — Ambulatory Visit: Payer: Medicare Other | Admitting: Family Medicine

## 2018-09-14 MED ORDER — FLUCONAZOLE 150 MG PO TABS: ORAL_TABLET | ORAL | 0 refills | Status: DC

## 2018-09-14 NOTE — Telephone Encounter (Signed)
Pt contacted office stating that she needed something sent in for antibiotic. Pt was given an appt for this afternoon. Nurse contacted patient and pt is having itching and discharge. No pain no fever. Pt is currently on antibiotic for infected tooth. Will send in Diflucan per protocol. Pt verbalized understanding. Pt appt cancelled for this afternoon.

## 2018-09-15 ENCOUNTER — Encounter (HOSPITAL_COMMUNITY): Admission: RE | Admit: 2018-09-15 | Payer: Medicare Other | Source: Ambulatory Visit

## 2018-09-22 ENCOUNTER — Other Ambulatory Visit: Payer: Self-pay

## 2018-09-22 ENCOUNTER — Encounter (HOSPITAL_COMMUNITY)
Admission: RE | Admit: 2018-09-22 | Discharge: 2018-09-22 | Disposition: A | Discharge status: Home / Self Care | Payer: Medicare Other | Source: Ambulatory Visit | Attending: Neurology | Admitting: Neurology

## 2018-09-22 DIAGNOSIS — G35 Multiple sclerosis: Secondary | ICD-10-CM | POA: Insufficient documentation

## 2018-09-22 MED ORDER — ACETAMINOPHEN 325 MG PO TABS
650.0000 mg | ORAL_TABLET | Freq: Once | ORAL | Status: AC
  Administered 2018-09-22: 650 mg via ORAL

## 2018-09-22 MED ORDER — DIPHENHYDRAMINE HCL 50 MG/ML IJ SOLN
50.0000 mg | Freq: Once | INTRAMUSCULAR | Status: AC
  Administered 2018-09-22: 08:00:00 50 mg via INTRAVENOUS

## 2018-09-22 MED ORDER — METHYLPREDNISOLONE SODIUM SUCC 125 MG IJ SOLR
INTRAMUSCULAR | Status: AC
  Filled 2018-09-22: qty 2

## 2018-09-22 MED ORDER — METHYLPREDNISOLONE SODIUM SUCC 125 MG IJ SOLR
100.0000 mg | Freq: Once | INTRAMUSCULAR | Status: AC
  Administered 2018-09-22: 100 mg via INTRAVENOUS

## 2018-09-22 MED ORDER — ACETAMINOPHEN 325 MG PO TABS
ORAL_TABLET | ORAL | Status: AC
  Filled 2018-09-22: qty 2

## 2018-09-22 MED ORDER — SODIUM CHLORIDE 0.9 % IV SOLN
600.0000 mg | Freq: Once | INTRAVENOUS | Status: AC
  Administered 2018-09-22: 09:00:00 600 mg via INTRAVENOUS
  Filled 2018-09-22: qty 20

## 2018-09-22 MED ORDER — DIPHENHYDRAMINE HCL 50 MG/ML IJ SOLN
INTRAMUSCULAR | Status: AC
  Filled 2018-09-22: qty 1

## 2018-09-28 DIAGNOSIS — R5381 Other malaise: Secondary | ICD-10-CM | POA: Diagnosis not present

## 2018-09-28 DIAGNOSIS — G35 Multiple sclerosis: Secondary | ICD-10-CM | POA: Diagnosis not present

## 2018-09-28 DIAGNOSIS — G894 Chronic pain syndrome: Secondary | ICD-10-CM | POA: Diagnosis not present

## 2018-09-28 DIAGNOSIS — Z79899 Other long term (current) drug therapy: Secondary | ICD-10-CM | POA: Diagnosis not present

## 2018-12-26 DIAGNOSIS — G894 Chronic pain syndrome: Secondary | ICD-10-CM | POA: Diagnosis not present

## 2018-12-26 DIAGNOSIS — Z79899 Other long term (current) drug therapy: Secondary | ICD-10-CM | POA: Diagnosis not present

## 2018-12-26 DIAGNOSIS — R5381 Other malaise: Secondary | ICD-10-CM | POA: Diagnosis not present

## 2018-12-26 DIAGNOSIS — G35 Multiple sclerosis: Secondary | ICD-10-CM | POA: Diagnosis not present

## 2018-12-27 ENCOUNTER — Ambulatory Visit (INDEPENDENT_AMBULATORY_CARE_PROVIDER_SITE_OTHER): Payer: Medicare Other | Admitting: Family Medicine

## 2018-12-27 ENCOUNTER — Other Ambulatory Visit: Payer: Self-pay

## 2018-12-27 DIAGNOSIS — E7849 Other hyperlipidemia: Secondary | ICD-10-CM

## 2018-12-27 DIAGNOSIS — I10 Essential (primary) hypertension: Secondary | ICD-10-CM

## 2018-12-27 MED ORDER — LOSARTAN POTASSIUM 100 MG PO TABS: 100.0000 mg | ORAL_TABLET | Freq: Every day | ORAL | 1 refills | Status: DC

## 2018-12-27 MED ORDER — AMLODIPINE BESYLATE 5 MG PO TABS: 5.0000 mg | ORAL_TABLET | Freq: Every day | ORAL | 0 refills | Status: DC

## 2018-12-27 NOTE — Progress Notes (Signed)
   Subjective:    Patient ID: Natalie Campos, female    DOB: [birth year], 58 y.o.   MRN: [id number]  Hypertension This is a chronic problem. Associated symptoms include headaches. Pertinent negatives include no chest pain or shortness of breath. (Vertigo (part of MS)) There are no compliance problems.    pt states that she needs one month of Losartan sent to [facility] because she is out and she needs the rest to go to [facility]. Pt states she does not check her BP often.   15 minutes was spent with patient today discussing healthcare issues which they came.  More than 50% of this visit-total duration of visit-was spent in counseling and coordination of care.  Please see diagnosis regarding the focus of this coordination and care  Virtual Visit via Video Note  I connected with Horald Pollen on 12/27/18 at  2:00 PM EDT by a video enabled telemedicine application and verified that I am speaking with the correct person using two identifiers.  Location: Patient: home Provider: office   I discussed the limitations of evaluation and management by telemedicine and the availability of in person appointments. The patient expressed understanding and agreed to proceed.  History of Present Illness:    Observations/Objective:   Assessment and Plan:   Follow Up Instructions:    I discussed the assessment and treatment plan with the patient. The patient was provided an opportunity to ask questions and all were answered. The patient agreed with the plan and demonstrated an understanding of the instructions.   The patient was advised to call back or seek an in-person evaluation if the symptoms worsen or if the condition fails to improve as anticipated.  I provided 15 minutes of non-face-to-face time during this encounter.   Vicente Males, LPN     Review of Systems  Constitutional: Negative for activity change, appetite change and fatigue.  HENT: Negative for congestion and rhinorrhea.    Respiratory: Negative for cough and shortness of breath.   Cardiovascular: Negative for chest pain and leg swelling.  Gastrointestinal: Negative for abdominal pain and diarrhea.  Endocrine: Negative for polydipsia and polyphagia.  Skin: Negative for color change.  Neurological: Positive for headaches. Negative for dizziness and weakness.  Psychiatric/Behavioral: Negative for behavioral problems and confusion.   Patient has MS she is high risk for COVID she is doing her best to minimize her risk    Objective:   Physical Exam Today's visit was via telephone Physical exam was not possible for this visit   15 minutes was spent with patient today discussing healthcare issues which they came.  More than 50% of this visit-total duration of visit-was spent in counseling and coordination of care.  Please see diagnosis regarding the focus of this coordination and care      Assessment & Plan:  HTN decent control currently continue current measures.  Follow-up if ongoing trouble.  Minimize salt stay active take medicine call us if any problems  Follow-up 6 months  Hyperlipidemia watch diet closely no labs necessary currently but will do labs on follow-up

## 2019-01-17 ENCOUNTER — Other Ambulatory Visit: Payer: Self-pay

## 2019-01-17 ENCOUNTER — Ambulatory Visit (INDEPENDENT_AMBULATORY_CARE_PROVIDER_SITE_OTHER): Payer: Medicare Other | Admitting: Internal Medicine

## 2019-01-17 ENCOUNTER — Encounter (INDEPENDENT_AMBULATORY_CARE_PROVIDER_SITE_OTHER): Payer: Self-pay | Admitting: Internal Medicine

## 2019-01-17 VITALS — BP 143/90 | HR 120 | Temp 97.6°F | Ht 69.0 in | Wt 231.4 lb

## 2019-01-17 DIAGNOSIS — K219 Gastro-esophageal reflux disease without esophagitis: Secondary | ICD-10-CM

## 2019-01-17 DIAGNOSIS — K2270 Barrett's esophagus without dysplasia: Secondary | ICD-10-CM | POA: Diagnosis not present

## 2019-01-17 DIAGNOSIS — K582 Mixed irritable bowel syndrome: Secondary | ICD-10-CM | POA: Diagnosis not present

## 2019-01-17 NOTE — Patient Instructions (Signed)
Will consider EGD next year when Covid-19 pandemic over.

## 2019-01-17 NOTE — Progress Notes (Signed)
Presenting complaint;  Follow-up for complicated GERD.  Database and subjective:  Patient is 58 year old Caucasian female with multiple medical problems including MS who has been on Ocrevus since November 2018 and experiencing multiple side effects but remains with this medication who also has GERD complicated by short segment Barrett's esophagus who is here for scheduled visit.  She was last seen in March 2020. She states she is doing well from GI standpoint.  She says she rarely has heartburn.  Lately she has not had any swallowing difficulty.  She states she may use Rolaids once a month or once every 2 months.  Her bowel habits remain irregular.  She has chronic abdominal pain.  She does get some relief with bowel movements.  She denies melena or rectal bleeding.  She remains with extreme fatigue.  She says she has been diagnosed with psoriasis involving scalp as well as vaginal and perianal areas.  She had vaginal biopsy by Dr. Barrie Dunker to confirm this diagnosis.  She also remains concerned about other complications such as PML and breast carcinoma.  She sees Dr. Merlene Laughter.  Dr. Merlene Laughter is planning a follow-up MRI to determine disease activity before treatment changes made. Patient states she had 8 courses of antibiotics regarding dental infection.  She had multiple and interventions.  She is still having some issues. She states she eats very little.  She is very disappointed that she is not been able to lose weight.  She has seen nutrition in the past but she says that visit was not helpful.  She is interested in seeing a dietitian.  Current Medications: Outpatient Encounter Medications as of 01/17/2019  Medication Sig  . amLODipine (NORVASC) 5 MG tablet Take 1 tablet (5 mg total) by mouth daily.  Marland Kitchen aspirin (ASPIRIN LOW DOSE) 81 MG EC tablet Take 81 mg by mouth 2 (two) times daily. One tablet by mouth once daily  . baclofen (LIORESAL) 10 MG tablet Take 10 mg by mouth 2 (two) times daily.   .  Cholecalciferol (VITAMIN D3) 3000 UNITS TABS Take 1 tablet by mouth daily. 50,000 u weekly  . diazepam (VALIUM) 10 MG tablet Take 10 mg by mouth 3 (three) times daily as needed. One tablet by mouth 4 times daily  . docusate sodium (COLACE) 100 MG capsule Take 300 mg by mouth daily. One caplet by mouth  Two times a day  . DULoxetine (CYMBALTA) 30 MG capsule Take 60 mg by mouth daily.   . famotidine (PEPCID) 20 MG tablet Take 1 tablet (20 mg total) by mouth at bedtime.  . ferrous sulfate (FERROUSUL) 325 (65 FE) MG tablet Take 1 tablet (325 mg total) by mouth 2 (two) times daily with a meal.  . fish oil-omega-3 fatty acids 1000 MG capsule Take by mouth daily. One cap by mouth daily     . gabapentin (NEURONTIN) 600 MG tablet Take 600 mg by mouth 3 (three) times daily.   Marland Kitchen HYDROmorphone (DILAUDID) 4 MG tablet Take 4 mg by mouth every 12 (twelve) hours as needed for severe pain.  Marland Kitchen losartan (COZAAR) 100 MG tablet Take 1 tablet (100 mg total) by mouth daily.  . modafinil (PROVIGIL) 200 MG tablet 200 mg 2 (two) times daily.   . multivitamin (THERAGRAN) per tablet Take 1 tablet by mouth daily. One tablet by mouth once daily   . Ocrelizumab (OCREVUS IV) Inject into the vein. Infused every 6 months.  Marland Kitchen omeprazole (PRILOSEC) 40 MG capsule TAKE 1 CAPSULE BY MOUTH ONCE DAILY.  Marland Kitchen  oxybutynin (DITROPAN-XL) 5 MG 24 hr tablet Take 5 mg by mouth at bedtime.  . polyethylene glycol powder (GLYCOLAX/MIRALAX) powder Take 25.5 g by mouth daily.  . potassium chloride (K-DUR) 10 MEQ tablet Take 1 tablet (10 mEq total) by mouth 2 (two) times daily.  Marland Kitchen topiramate (TOPAMAX) 100 MG tablet Take 100 mg by mouth daily.  . traMADol (ULTRAM) 50 MG tablet Take 100 mg by mouth 2 (two) times daily.   . vitamin E 1000 UNIT capsule Take 1,000 Units by mouth daily.  . [DISCONTINUED] fluconazole (DIFLUCAN) 150 MG tablet Take one by mouth; may repeat 3 days later (Patient not taking: Reported on 01/17/2019)  . [DISCONTINUED] mometasone  (ELOCON) 0.1 % cream Apply qd prn (Patient not taking: Reported on 01/17/2019)  . [DISCONTINUED] olopatadine (PATANOL) 0.1 % ophthalmic solution PLACE 1 DROP INTO BOTH EYES AS NEEDED. (Patient not taking: Reported on 01/17/2019)  . [DISCONTINUED] triamcinolone cream (KENALOG) 0.1 % Apply 1 application topically 2 (two) times daily. (Patient not taking: Reported on 01/17/2019)   Facility-Administered Encounter Medications as of 01/17/2019  Medication  . Influenza (>/= 3 years) inactive virus vaccine (FLVIRIN/FLUZONE) injection SUSP 0.5 mL     Objective: Blood pressure (!) 143/90, pulse (!) 120, temperature 97.6 F (36.4 C), temperature source Oral, height 5\' 9"  (1.753 m), weight 231 lb 6.4 oz (105 kg). Patient is alert and in no acute distress. She is wearing facial mask. Conjunctiva is pink. Sclera is nonicteric Oropharyngeal mucosa is normal. No neck masses or thyromegaly noted. Cardiac exam with regular rhythm normal S1 and S2. No murmur or gallop noted. Lungs are clear to auscultation. Abdomen is full.  Bowel sounds are normal.  On palpation abdomen is soft.  She has mild generalized tenderness without guarding.  No organomegaly or masses. No LE edema or clubbing noted.   Assessment:  #1.  Chronic GERD complicated by short segment Barrett's esophagus.  Symptoms are well controlled with therapy.  Last surveillance EGD was in April 2015.  She will undergo EGD when COVID-19 pandemic over.  #2.  Weight issues.  Patient has not been able to lose weight and is interested in dietary consultation.  Plan:  Patient will continue antireflux measures and omeprazole 40 mg in a.m. and famotidine 20 mg at bedtime. Dietary consultation with Ms. Harriett Sine,, RD. Esophagogastroduodenoscopy either later this year or next.

## 2019-01-19 ENCOUNTER — Telehealth (INDEPENDENT_AMBULATORY_CARE_PROVIDER_SITE_OTHER): Payer: Self-pay | Admitting: *Deleted

## 2019-01-19 NOTE — Telephone Encounter (Signed)
Info placed in Epic, they will contact patient

## 2019-01-19 NOTE — Telephone Encounter (Signed)
Dr.Rehman ask that the patient be referred to [facility] for a dietary management for weight loss.

## 2019-01-20 ENCOUNTER — Encounter: Payer: Self-pay | Admitting: Family Medicine

## 2019-01-23 ENCOUNTER — Other Ambulatory Visit (INDEPENDENT_AMBULATORY_CARE_PROVIDER_SITE_OTHER): Payer: Self-pay | Admitting: Internal Medicine

## 2019-02-06 ENCOUNTER — Other Ambulatory Visit: Payer: Self-pay | Admitting: Family Medicine

## 2019-02-27 ENCOUNTER — Ambulatory Visit: Payer: Medicare Other | Admitting: Nutrition

## 2019-02-28 NOTE — Addendum Note (Signed)
Encounter addended by: Holley Dexter, RN on: 02/28/2019 3:34 PM  Actions taken: MAR administration edited, MAR administration accepted

## 2019-02-28 NOTE — Addendum Note (Signed)
Encounter addended by: Holley Dexter, RN on: 02/28/2019 3:47 PM  Actions taken: MAR administration edited, MAR administration accepted

## 2019-02-28 NOTE — Addendum Note (Signed)
Encounter addended by: Holley Dexter, RN on: 02/28/2019 3:51 PM  Actions taken: MAR administration edited, MAR administration accepted

## 2019-03-13 DIAGNOSIS — M1712 Unilateral primary osteoarthritis, left knee: Secondary | ICD-10-CM | POA: Diagnosis not present

## 2019-03-23 ENCOUNTER — Other Ambulatory Visit: Payer: Self-pay | Admitting: Family Medicine

## 2019-03-27 ENCOUNTER — Other Ambulatory Visit: Payer: Self-pay

## 2019-03-27 ENCOUNTER — Encounter (HOSPITAL_COMMUNITY)
Admission: RE | Admit: 2019-03-27 | Discharge: 2019-03-27 | Disposition: A | Discharge status: Home / Self Care | Payer: Medicare Other | Source: Ambulatory Visit | Attending: Neurology | Admitting: Neurology

## 2019-03-27 DIAGNOSIS — G35 Multiple sclerosis: Secondary | ICD-10-CM | POA: Diagnosis not present

## 2019-03-27 MED ORDER — METHYLPREDNISOLONE SODIUM SUCC 125 MG IJ SOLR
100.0000 mg | Freq: Once | INTRAMUSCULAR | Status: AC
  Administered 2019-03-27: 100 mg via INTRAVENOUS

## 2019-03-27 MED ORDER — DIPHENHYDRAMINE HCL 50 MG/ML IJ SOLN
INTRAMUSCULAR | Status: AC
  Filled 2019-03-27: qty 1

## 2019-03-27 MED ORDER — ACETAMINOPHEN 325 MG PO TABS
ORAL_TABLET | ORAL | Status: AC
  Filled 2019-03-27: qty 2

## 2019-03-27 MED ORDER — SODIUM CHLORIDE 0.9 % IV SOLN
600.0000 mg | Freq: Once | INTRAVENOUS | Status: AC
  Administered 2019-03-27: 600 mg via INTRAVENOUS
  Filled 2019-03-27: qty 20

## 2019-03-27 MED ORDER — SODIUM CHLORIDE 0.9 % IV SOLN
Freq: Once | INTRAVENOUS | Status: AC
  Administered 2019-03-27: 09:00:00 via INTRAVENOUS

## 2019-03-27 MED ORDER — SODIUM CHLORIDE 0.9 % IV SOLN
600.0000 mg | Freq: Once | INTRAVENOUS | Status: DC
  Filled 2019-03-27: qty 20

## 2019-03-27 MED ORDER — METHYLPREDNISOLONE SODIUM SUCC 125 MG IJ SOLR
INTRAMUSCULAR | Status: AC
  Filled 2019-03-27: qty 2

## 2019-03-27 MED ORDER — DIPHENHYDRAMINE HCL 50 MG/ML IJ SOLN
50.0000 mg | Freq: Once | INTRAMUSCULAR | Status: AC
  Administered 2019-03-27: 50 mg via INTRAVENOUS

## 2019-03-27 MED ORDER — ACETAMINOPHEN 325 MG PO TABS
650.0000 mg | ORAL_TABLET | Freq: Once | ORAL | Status: AC
  Administered 2019-03-27: 650 mg via ORAL

## 2019-04-10 DIAGNOSIS — R5381 Other malaise: Secondary | ICD-10-CM | POA: Diagnosis not present

## 2019-04-10 DIAGNOSIS — G35 Multiple sclerosis: Secondary | ICD-10-CM | POA: Diagnosis not present

## 2019-04-10 DIAGNOSIS — G894 Chronic pain syndrome: Secondary | ICD-10-CM | POA: Diagnosis not present

## 2019-04-10 DIAGNOSIS — Z79899 Other long term (current) drug therapy: Secondary | ICD-10-CM | POA: Diagnosis not present

## 2019-04-11 ENCOUNTER — Other Ambulatory Visit: Payer: Self-pay

## 2019-04-11 DIAGNOSIS — Z20822 Contact with and (suspected) exposure to covid-19: Secondary | ICD-10-CM

## 2019-04-13 ENCOUNTER — Encounter: Payer: Self-pay | Admitting: Family Medicine

## 2019-04-13 LAB — NOVEL CORONAVIRUS, NAA: SARS-CoV-2, NAA: NOT DETECTED

## 2019-04-16 ENCOUNTER — Encounter: Payer: Self-pay | Admitting: Family Medicine

## 2019-04-17 ENCOUNTER — Ambulatory Visit: Payer: Medicare Other | Admitting: Family Medicine

## 2019-04-17 ENCOUNTER — Other Ambulatory Visit: Payer: Self-pay

## 2019-04-17 NOTE — Telephone Encounter (Signed)
Nurses  Please put her on my schedule for virtual visit sometime today-this can be video or phone

## 2019-04-17 NOTE — Telephone Encounter (Signed)
Patient scheduled virtual visit today with Dr Scott 

## 2019-05-11 ENCOUNTER — Other Ambulatory Visit: Payer: Self-pay | Admitting: Family Medicine

## 2019-05-14 NOTE — Telephone Encounter (Signed)
1 refill set up virtual visit in February

## 2019-05-15 NOTE — Telephone Encounter (Signed)
Please schedule and then route back thanks 

## 2019-05-15 NOTE — Telephone Encounter (Signed)
Scheduled 2/3

## 2019-05-24 DIAGNOSIS — M25462 Effusion, left knee: Secondary | ICD-10-CM | POA: Diagnosis not present

## 2019-05-24 DIAGNOSIS — M25562 Pain in left knee: Secondary | ICD-10-CM | POA: Diagnosis not present

## 2019-05-24 DIAGNOSIS — M1712 Unilateral primary osteoarthritis, left knee: Secondary | ICD-10-CM | POA: Diagnosis not present

## 2019-05-24 DIAGNOSIS — S83242A Other tear of medial meniscus, current injury, left knee, initial encounter: Secondary | ICD-10-CM | POA: Diagnosis not present

## 2019-06-05 DIAGNOSIS — S83242D Other tear of medial meniscus, current injury, left knee, subsequent encounter: Secondary | ICD-10-CM | POA: Diagnosis not present

## 2019-06-05 DIAGNOSIS — M1712 Unilateral primary osteoarthritis, left knee: Secondary | ICD-10-CM | POA: Diagnosis not present

## 2019-06-14 ENCOUNTER — Ambulatory Visit (INDEPENDENT_AMBULATORY_CARE_PROVIDER_SITE_OTHER): Payer: Medicare Other | Admitting: Family Medicine

## 2019-06-14 ENCOUNTER — Other Ambulatory Visit: Payer: Self-pay

## 2019-06-14 DIAGNOSIS — I10 Essential (primary) hypertension: Secondary | ICD-10-CM

## 2019-06-14 DIAGNOSIS — E7849 Other hyperlipidemia: Secondary | ICD-10-CM | POA: Diagnosis not present

## 2019-06-14 DIAGNOSIS — R748 Abnormal levels of other serum enzymes: Secondary | ICD-10-CM

## 2019-06-14 MED ORDER — AMLODIPINE BESYLATE 5 MG PO TABS: 5.0000 mg | ORAL_TABLET | Freq: Every day | ORAL | 1 refills | Status: DC

## 2019-06-14 MED ORDER — LOSARTAN POTASSIUM 100 MG PO TABS: 100.0000 mg | ORAL_TABLET | Freq: Every day | ORAL | 1 refills | Status: DC

## 2019-06-14 NOTE — Progress Notes (Signed)
   Subjective:    Patient ID: Natalie Campos, female    DOB: January 26, 1961, 59 y.o.   MRN: [id number]  Hypertension This is a chronic problem. Pertinent negatives include no chest pain or shortness of breath.  Pt has been taking blood pressure at home at times. Pt states her numbers have been stable at 130s over 78. Diastolic number has been any where between 78-94. No issues or concerns. Pt takes medication daily as directed.  Essential hypertension - Plan: Lipid Profile, Hepatic function panel, Basic Metabolic Panel (BMET)  Other hyperlipidemia - Plan: Lipid Profile, Hepatic function panel, Basic Metabolic Panel (BMET)  Elevated alkaline phosphatase level - Plan: Lipid Profile, Hepatic function panel, Basic Metabolic Panel (BMET)   Virtual Visit via Telephone Note  I connected with Natalie Campos on 06/14/19 at  2:00 PM EST by telephone and verified that I am speaking with the correct person using two identifiers.  Location: Patient: home Provider: office   I discussed the limitations, risks, security and privacy concerns of performing an evaluation and management service by telephone and the availability of in person appointments. I also discussed with the patient that there may be a patient responsible charge related to this service. The patient expressed understanding and agreed to proceed.   History of Present Illness:    Observations/Objective:   Assessment and Plan:   Follow Up Instructions:    I discussed the assessment and treatment plan with the patient. The patient was provided an opportunity to ask questions and all were answered. The patient agreed with the plan and demonstrated an understanding of the instructions.   The patient was advised to call back or seek an in-person evaluation if the symptoms worsen or if the condition fails to improve as anticipated.  I provided 25 minutes of non-face-to-face time during this encounter.        Review of Systems    Constitutional: Positive for fatigue. Negative for activity change and appetite change.  HENT: Negative for congestion and rhinorrhea.   Respiratory: Negative for cough and shortness of breath.   Cardiovascular: Negative for chest pain and leg swelling.  Gastrointestinal: Negative for abdominal pain and diarrhea.  Endocrine: Negative for polydipsia and polyphagia.  Skin: Negative for color change.  Neurological: Negative for dizziness and weakness.  Psychiatric/Behavioral: Negative for behavioral problems and confusion.       Objective:   Physical Exam  Today's visit was via telephone Physical exam was not possible for this visit       Assessment & Plan:  1. Essential hypertension Patient relates blood pressure under good control good control continue current measures - Lipid Profile - Hepatic function panel - Basic Metabolic Panel (BMET)  2. Other hyperlipidemia She does try to watch how she eats she will check her lab work by the spring follow-up in the office this summer - Lipid Profile - Hepatic function panel - Basic Metabolic Panel (BMET)  3. Elevated alkaline phosphatase level She will need to recheck her liver profile. - Lipid Profile - Hepatic function panel - Basic Metabolic Panel (BMET)

## 2019-06-26 DIAGNOSIS — M1712 Unilateral primary osteoarthritis, left knee: Secondary | ICD-10-CM | POA: Diagnosis not present

## 2019-07-03 DIAGNOSIS — M1712 Unilateral primary osteoarthritis, left knee: Secondary | ICD-10-CM | POA: Diagnosis not present

## 2019-07-05 DIAGNOSIS — G35 Multiple sclerosis: Secondary | ICD-10-CM | POA: Diagnosis not present

## 2019-07-05 DIAGNOSIS — R5381 Other malaise: Secondary | ICD-10-CM | POA: Diagnosis not present

## 2019-07-05 DIAGNOSIS — G894 Chronic pain syndrome: Secondary | ICD-10-CM | POA: Diagnosis not present

## 2019-07-05 DIAGNOSIS — Z79899 Other long term (current) drug therapy: Secondary | ICD-10-CM | POA: Diagnosis not present

## 2019-07-10 DIAGNOSIS — M1712 Unilateral primary osteoarthritis, left knee: Secondary | ICD-10-CM | POA: Diagnosis not present

## 2019-07-18 ENCOUNTER — Ambulatory Visit (INDEPENDENT_AMBULATORY_CARE_PROVIDER_SITE_OTHER): Payer: Medicare Other | Admitting: Internal Medicine

## 2019-07-18 DIAGNOSIS — Z1231 Encounter for screening mammogram for malignant neoplasm of breast: Secondary | ICD-10-CM | POA: Diagnosis not present

## 2019-07-21 ENCOUNTER — Encounter: Payer: Self-pay | Admitting: Family Medicine

## 2019-07-22 ENCOUNTER — Other Ambulatory Visit (INDEPENDENT_AMBULATORY_CARE_PROVIDER_SITE_OTHER): Payer: Self-pay | Admitting: Internal Medicine

## 2019-07-24 DIAGNOSIS — M1712 Unilateral primary osteoarthritis, left knee: Secondary | ICD-10-CM | POA: Diagnosis not present

## 2019-09-13 DIAGNOSIS — Z79899 Other long term (current) drug therapy: Secondary | ICD-10-CM | POA: Diagnosis not present

## 2019-09-20 DIAGNOSIS — R5382 Chronic fatigue, unspecified: Secondary | ICD-10-CM | POA: Diagnosis not present

## 2019-09-20 DIAGNOSIS — Z79899 Other long term (current) drug therapy: Secondary | ICD-10-CM | POA: Diagnosis not present

## 2019-09-20 DIAGNOSIS — R262 Difficulty in walking, not elsewhere classified: Secondary | ICD-10-CM | POA: Diagnosis not present

## 2019-09-20 DIAGNOSIS — G894 Chronic pain syndrome: Secondary | ICD-10-CM | POA: Diagnosis not present

## 2019-09-20 DIAGNOSIS — G35 Multiple sclerosis: Secondary | ICD-10-CM | POA: Diagnosis not present

## 2019-09-21 ENCOUNTER — Other Ambulatory Visit (HOSPITAL_COMMUNITY): Payer: Self-pay | Admitting: Neurology

## 2019-09-21 ENCOUNTER — Other Ambulatory Visit: Payer: Self-pay | Admitting: Neurology

## 2019-09-21 DIAGNOSIS — G35 Multiple sclerosis: Secondary | ICD-10-CM

## 2019-09-25 ENCOUNTER — Other Ambulatory Visit: Payer: Self-pay

## 2019-09-25 ENCOUNTER — Encounter (HOSPITAL_COMMUNITY)
Admission: RE | Admit: 2019-09-25 | Discharge: 2019-09-25 | Disposition: A | Discharge status: Home / Self Care | Payer: Medicare Other | Source: Ambulatory Visit | Attending: Neurology | Admitting: Neurology

## 2019-09-25 DIAGNOSIS — G35 Multiple sclerosis: Secondary | ICD-10-CM | POA: Insufficient documentation

## 2019-09-25 MED ORDER — METHYLPREDNISOLONE SODIUM SUCC 125 MG IJ SOLR
100.0000 mg | Freq: Once | INTRAMUSCULAR | Status: AC
  Administered 2019-09-25: 100 mg via INTRAVENOUS

## 2019-09-25 MED ORDER — ACETAMINOPHEN 325 MG PO TABS
650.0000 mg | ORAL_TABLET | Freq: Once | ORAL | Status: AC
  Administered 2019-09-25: 650 mg via ORAL

## 2019-09-25 MED ORDER — SODIUM CHLORIDE 0.9 % IV SOLN
600.0000 mg | Freq: Once | INTRAVENOUS | Status: AC
  Administered 2019-09-25: 600 mg via INTRAVENOUS
  Filled 2019-09-25: qty 20

## 2019-09-25 MED ORDER — SODIUM CHLORIDE 0.9 % IV SOLN: Freq: Once | INTRAVENOUS | Status: AC

## 2019-09-25 MED ORDER — DIPHENHYDRAMINE HCL 50 MG/ML IJ SOLN
50.0000 mg | Freq: Once | INTRAMUSCULAR | Status: AC
  Administered 2019-09-25: 50 mg via INTRAVENOUS

## 2019-10-22 ENCOUNTER — Other Ambulatory Visit: Payer: Self-pay | Admitting: Family Medicine

## 2019-11-01 ENCOUNTER — Other Ambulatory Visit: Payer: Self-pay

## 2019-11-01 ENCOUNTER — Ambulatory Visit (HOSPITAL_COMMUNITY)
Admission: RE | Admit: 2019-11-01 | Discharge: 2019-11-01 | Disposition: A | Discharge status: Home / Self Care | Payer: Medicare Other | Source: Ambulatory Visit | Attending: Neurology | Admitting: Neurology

## 2019-11-01 DIAGNOSIS — G35 Multiple sclerosis: Secondary | ICD-10-CM | POA: Diagnosis not present

## 2019-11-01 DIAGNOSIS — R519 Headache, unspecified: Secondary | ICD-10-CM | POA: Diagnosis not present

## 2019-11-01 DIAGNOSIS — R410 Disorientation, unspecified: Secondary | ICD-10-CM | POA: Diagnosis not present

## 2019-12-04 DIAGNOSIS — H2513 Age-related nuclear cataract, bilateral: Secondary | ICD-10-CM | POA: Diagnosis not present

## 2019-12-04 DIAGNOSIS — H25013 Cortical age-related cataract, bilateral: Secondary | ICD-10-CM | POA: Diagnosis not present

## 2019-12-04 DIAGNOSIS — H52223 Regular astigmatism, bilateral: Secondary | ICD-10-CM | POA: Diagnosis not present

## 2019-12-04 DIAGNOSIS — H35363 Drusen (degenerative) of macula, bilateral: Secondary | ICD-10-CM | POA: Diagnosis not present

## 2019-12-04 DIAGNOSIS — H35013 Changes in retinal vascular appearance, bilateral: Secondary | ICD-10-CM | POA: Diagnosis not present

## 2019-12-04 DIAGNOSIS — H47292 Other optic atrophy, left eye: Secondary | ICD-10-CM | POA: Diagnosis not present

## 2019-12-18 DIAGNOSIS — G894 Chronic pain syndrome: Secondary | ICD-10-CM | POA: Diagnosis not present

## 2019-12-18 DIAGNOSIS — R5382 Chronic fatigue, unspecified: Secondary | ICD-10-CM | POA: Diagnosis not present

## 2019-12-18 DIAGNOSIS — G35 Multiple sclerosis: Secondary | ICD-10-CM | POA: Diagnosis not present

## 2019-12-18 DIAGNOSIS — R262 Difficulty in walking, not elsewhere classified: Secondary | ICD-10-CM | POA: Diagnosis not present

## 2019-12-18 DIAGNOSIS — H468 Other optic neuritis: Secondary | ICD-10-CM | POA: Diagnosis not present

## 2019-12-18 DIAGNOSIS — H25013 Cortical age-related cataract, bilateral: Secondary | ICD-10-CM | POA: Diagnosis not present

## 2019-12-18 DIAGNOSIS — H2513 Age-related nuclear cataract, bilateral: Secondary | ICD-10-CM | POA: Diagnosis not present

## 2019-12-18 DIAGNOSIS — Z79899 Other long term (current) drug therapy: Secondary | ICD-10-CM | POA: Diagnosis not present

## 2019-12-18 DIAGNOSIS — H47292 Other optic atrophy, left eye: Secondary | ICD-10-CM | POA: Diagnosis not present

## 2020-01-02 ENCOUNTER — Other Ambulatory Visit: Payer: Self-pay

## 2020-01-02 ENCOUNTER — Encounter (INDEPENDENT_AMBULATORY_CARE_PROVIDER_SITE_OTHER): Payer: Self-pay | Admitting: Internal Medicine

## 2020-01-02 ENCOUNTER — Ambulatory Visit (INDEPENDENT_AMBULATORY_CARE_PROVIDER_SITE_OTHER): Payer: Medicare Other | Admitting: Internal Medicine

## 2020-01-02 VITALS — BP 142/93 | HR 93 | Temp 99.2°F | Ht 69.0 in | Wt 222.7 lb

## 2020-01-02 DIAGNOSIS — K2270 Barrett's esophagus without dysplasia: Secondary | ICD-10-CM

## 2020-01-02 DIAGNOSIS — K582 Mixed irritable bowel syndrome: Secondary | ICD-10-CM | POA: Diagnosis not present

## 2020-01-02 DIAGNOSIS — K219 Gastro-esophageal reflux disease without esophagitis: Secondary | ICD-10-CM | POA: Diagnosis not present

## 2020-01-02 MED ORDER — BISACODYL 10 MG RE SUPP
10.0000 mg | RECTAL | Status: DC | PRN
Start: 2020-01-02 — End: 2020-07-09

## 2020-01-02 NOTE — Patient Instructions (Addendum)
Please remember you cannot have ice cream or other desserts after supper. Can have palpable bloating crackers as snack if you need to. Can use Gaviscon 2 tablets chewable at bedtime as needed. Use Dulcolax suppository if you go 2 days without a bowel movement.

## 2020-01-02 NOTE — Progress Notes (Signed)
Presenting complaint;  Follow-up for chronic GERD and IBS.  Database and subjective:  Patient is 59 year old Caucasian female with multiple medical problems including MS on Ocrevus who has history of chronic GERD complicated by short segment Barrett's esophagus as well as history of IBS with both diarrhea and constipation who is here for scheduled visit.  She was last seen in September 2020. Her last esophagogastroduodenoscopy and colonoscopy when April 2015. EGD revealed short segment Barrett's esophagus moderate size hiatal hernia gastritis.  Esophageal biopsy confirmed Barrett's esophagus without dysplasia and gastric biopsy was negative for H. Pylori. Colonoscopy revealed external hemorrhoids and anal papillae otherwise unremarkable.  Patient states heartburns well controlled but she has nocturnal regurgitation which started about 30 days ago.  She eats her supper at 6 PM and goes to bed at midnight.  She she states her snack is ice cream.  She denies nausea vomiting dysphagia hoarseness chronic cough or sore throat. She weighs 9 pounds less than what she weighed in September last year.  She says her weight fluctuates quite a bit.  She was at Dr. Karsten Ro office 2 weeks ago when she weighed 215 pounds. She also complains of irregular bowel habits.  She says she can go 4 to 5 days without a bowel movement and experience lower abdominal pain and she would pass hard balls.  In a day or 2 later she would have explosive bowel movement few times.  And then she is back to being constipated.  She is raisin bran cereals every day.  She does eat fruits and salads but not on daily basis.  She is on Dilaudid for chronic pain.  Current Medications: Outpatient Encounter Medications as of 01/02/2020  Medication Sig  . amLODipine (NORVASC) 5 MG tablet Take 1 tablet (5 mg total) by mouth daily.  Marland Kitchen aspirin (ASPIRIN LOW DOSE) 81 MG EC tablet Take 81 mg by mouth 2 (two) times daily. One tablet by mouth once daily   . baclofen (LIORESAL) 10 MG tablet Take 10 mg by mouth 2 (two) times daily.   . Cholecalciferol (VITAMIN D3) 3000 UNITS TABS Take 1 tablet by mouth daily. 50,000 u weekly  . diazepam (VALIUM) 10 MG tablet Take 10 mg by mouth 3 (three) times daily as needed. One tablet by mouth 4 times daily  . docusate sodium (COLACE) 100 MG capsule Take 300 mg by mouth daily. One caplet by mouth  Two times a day  . DULoxetine (CYMBALTA) 30 MG capsule Take 60 mg by mouth daily.   . famotidine (PEPCID) 20 MG tablet TAKE 1 TABLET BY MOUTH AT  BEDTIME  . ferrous sulfate (FERROUSUL) 325 (65 FE) MG tablet Take 1 tablet (325 mg total) by mouth 2 (two) times daily with a meal.  . fish oil-omega-3 fatty acids 1000 MG capsule Take by mouth daily. One cap by mouth daily     . gabapentin (NEURONTIN) 600 MG tablet Take 600 mg by mouth 3 (three) times daily.   Marland Kitchen HYDROmorphone (DILAUDID) 4 MG tablet Take 4 mg by mouth every 12 (twelve) hours as needed for severe pain.  Marland Kitchen losartan (COZAAR) 100 MG tablet TAKE 1 TABLET BY MOUTH  DAILY  . modafinil (PROVIGIL) 200 MG tablet 200 mg 2 (two) times daily.   . multivitamin (THERAGRAN) per tablet Take 1 tablet by mouth daily. One tablet by mouth once daily   . Ocrelizumab (OCREVUS IV) Inject into the vein. Infused every 6 months.  Marland Kitchen omeprazole (PRILOSEC) 40 MG capsule TAKE 1 CAPSULE  BY MOUTH ONCE DAILY.  Marland Kitchen oxybutynin (DITROPAN-XL) 5 MG 24 hr tablet Take 5 mg by mouth at bedtime.  . polyethylene glycol powder (GLYCOLAX/MIRALAX) 17 GM/SCOOP powder MIX 25.5 GRAMS WITH WATER OR JUICE ONCE DAILY.  Marland Kitchen potassium chloride (K-DUR) 10 MEQ tablet Take 1 tablet (10 mEq total) by mouth 2 (two) times daily.  Marland Kitchen topiramate (TOPAMAX) 100 MG tablet Take 100 mg by mouth daily.  . traMADol (ULTRAM) 50 MG tablet Take 100 mg by mouth 2 (two) times daily.   . vitamin E 1000 UNIT capsule Take 1,000 Units by mouth daily.   Facility-Administered Encounter Medications as of 01/02/2020  Medication  . Influenza  (>/= 3 years) inactive virus vaccine (FLVIRIN/FLUZONE) injection SUSP 0.5 mL     Objective: Blood pressure (!) 142/93, pulse 93, temperature 99.2 F (37.3 C), temperature source Oral, height 5\' 9"  (1.753 m), weight 222 lb 11.2 oz (101 kg). Patient is alert and in no acute distress. She is wearing a mask. Conjunctiva is pink. Sclera is nonicteric Oropharyngeal mucosa is normal. No neck masses or thyromegaly noted. Cardiac exam with regular rhythm normal S1 and S2. No murmur or gallop noted. Lungs are clear to auscultation. Abdomen abdomen is full.  It somewhat flattened lower half most likely due to lipomatous atrophy due to injections.  On palpation is soft and nontender with organomegaly or masses. No LE edema or clubbing noted.   Assessment:  #1.  Chronic GERD complicated by short segment Barrett's esophagus.  Last EGD was over 5 years ago.  She is due for surveillance examination.  Heartburn is well controlled with therapy.  However she is having nocturnal regurgitation which may be because she is eating ice cream at night.  She would also benefit from elevating head of the bed by 4 to 6 inches.  #2.  Irritable bowel syndrome with both diarrhea and constipation.  She needs to have a bowel movement every other day if not daily.  Constipation will lead to diarrhea and back-and-forth.  She is up-to-date on screening for CRC.  Next screening colonoscopy due in April 2025.   Plan:  Patient advised not to eat ice cream or other desserts at night. She can try Gaviscon chewable 2 tablets at bedtime daily for few days and thereafter on as-needed basis. Continue with high-fiber diet. Continue polyethylene glycol.  Can titrate the dose. Use Dulcolax suppository if no bowel movement for 2 days. Office visit in 6 months. We will consider surveillance EGD after her next visit.

## 2020-01-16 DIAGNOSIS — H25812 Combined forms of age-related cataract, left eye: Secondary | ICD-10-CM | POA: Diagnosis not present

## 2020-01-16 DIAGNOSIS — H2512 Age-related nuclear cataract, left eye: Secondary | ICD-10-CM | POA: Diagnosis not present

## 2020-01-30 ENCOUNTER — Other Ambulatory Visit: Payer: Self-pay | Admitting: Family Medicine

## 2020-01-30 NOTE — Telephone Encounter (Signed)
Scheduled 10/25

## 2020-01-30 NOTE — Telephone Encounter (Signed)
May have 90-day with 1 refill

## 2020-02-14 ENCOUNTER — Encounter: Payer: Self-pay | Admitting: Family Medicine

## 2020-02-14 DIAGNOSIS — H25011 Cortical age-related cataract, right eye: Secondary | ICD-10-CM | POA: Diagnosis not present

## 2020-02-14 DIAGNOSIS — H2511 Age-related nuclear cataract, right eye: Secondary | ICD-10-CM | POA: Diagnosis not present

## 2020-02-20 DIAGNOSIS — H25011 Cortical age-related cataract, right eye: Secondary | ICD-10-CM | POA: Diagnosis not present

## 2020-02-20 DIAGNOSIS — H25811 Combined forms of age-related cataract, right eye: Secondary | ICD-10-CM | POA: Diagnosis not present

## 2020-02-20 DIAGNOSIS — H52211 Irregular astigmatism, right eye: Secondary | ICD-10-CM | POA: Diagnosis not present

## 2020-02-20 DIAGNOSIS — H2511 Age-related nuclear cataract, right eye: Secondary | ICD-10-CM | POA: Diagnosis not present

## 2020-02-27 DIAGNOSIS — H2511 Age-related nuclear cataract, right eye: Secondary | ICD-10-CM | POA: Diagnosis not present

## 2020-03-04 ENCOUNTER — Encounter: Payer: Self-pay | Admitting: Family Medicine

## 2020-03-04 ENCOUNTER — Other Ambulatory Visit: Payer: Self-pay

## 2020-03-04 ENCOUNTER — Ambulatory Visit (INDEPENDENT_AMBULATORY_CARE_PROVIDER_SITE_OTHER): Payer: Medicare Other | Admitting: Family Medicine

## 2020-03-04 VITALS — BP 138/84 | HR 118 | Temp 97.8°F | Wt 225.2 lb

## 2020-03-04 DIAGNOSIS — L821 Other seborrheic keratosis: Secondary | ICD-10-CM

## 2020-03-04 DIAGNOSIS — E7849 Other hyperlipidemia: Secondary | ICD-10-CM

## 2020-03-04 DIAGNOSIS — I10 Essential (primary) hypertension: Secondary | ICD-10-CM | POA: Diagnosis not present

## 2020-03-04 DIAGNOSIS — G35 Multiple sclerosis: Secondary | ICD-10-CM

## 2020-03-04 DIAGNOSIS — Z23 Encounter for immunization: Secondary | ICD-10-CM | POA: Diagnosis not present

## 2020-03-04 DIAGNOSIS — D509 Iron deficiency anemia, unspecified: Secondary | ICD-10-CM | POA: Diagnosis not present

## 2020-03-04 NOTE — Progress Notes (Signed)
° °  Subjective:    Patient ID: Natalie Campos, female    DOB: 19-Oct-1960, 59 y.o.   MRN: [id number]  Hypertension This is a chronic problem. Pertinent negatives include no chest pain or shortness of breath. Treatments tried: amlodipine, losartan. There are no compliance problems.    Patient has family hx of melanoma and would like her legs checked for moles.   Essential hypertension - Plan: Lipid Profile, Comprehensive Metabolic Panel (CMET), CBC with Differential, Ferritin  MULTIPLE SCLEROSIS - Plan: Comprehensive metabolic panel  Other hyperlipidemia - Plan: Lipid panel, Lipid Profile, Comprehensive Metabolic Panel (CMET), CBC with Differential, Ferritin  Need for vaccination - Plan: Flu Vaccine QUAD 6+ mos PF IM (Fluarix Quad PF)  Iron deficiency anemia, unspecified iron deficiency anemia type - Plan: Lipid Profile, Comprehensive Metabolic Panel (CMET), CBC with Differential, Ferritin  Seborrheic keratoses   Review of Systems  Constitutional: Negative for activity change and appetite change.  HENT: Negative for congestion and rhinorrhea.   Respiratory: Negative for cough and shortness of breath.   Cardiovascular: Negative for chest pain and leg swelling.  Gastrointestinal: Negative for abdominal pain, nausea and vomiting.  Skin: Negative for color change.  Neurological: Negative for dizziness and weakness.  Psychiatric/Behavioral: Negative for agitation and confusion.       Objective:   Physical Exam Vitals reviewed.  Constitutional:      General: She is not in acute distress. HENT:     Head: Normocephalic.  Cardiovascular:     Rate and Rhythm: Normal rate and regular rhythm.     Heart sounds: Normal heart sounds. No murmur heard.   Pulmonary:     Effort: Pulmonary effort is normal.     Breath sounds: Normal breath sounds.  Lymphadenopathy:     Cervical: No cervical adenopathy.  Neurological:     Mental Status: She is alert.  Psychiatric:        Behavior:  Behavior normal.    Thorough skin examination was completed. No sign of melanoma       Assessment & Plan:  1. Essential hypertension Blood pressure under good control try to watch diet minimize salt continue medication recheck if any ongoing troubles - Lipid Profile - Comprehensive Metabolic Panel (CMET) - CBC with Differential - Ferritin  2. MULTIPLE SCLEROSIS MS under good control sees local neurologist for this does mean every 80-month follow-up with them - Comprehensive metabolic panel  3. Other hyperlipidemia History of lipidemia watch fats in the diet check labs - Lipid panel - Lipid Profile - Comprehensive Metabolic Panel (CMET) - CBC with Differential - Ferritin  4. Need for vaccination Flu vaccine recommended - Flu Vaccine QUAD 6+ mos PF IM (Fluarix Quad PF)  5. Iron deficiency anemia, unspecified iron deficiency anemia type Iron deficient lab work checked because patient with history of iron deficiency have a lot of fatigue tiredness she is worried that she has had reoccurrence - Lipid Profile - Comprehensive Metabolic Panel (CMET) - CBC with Differential - Ferritin  6. Seborrheic keratoses Multiple seborrheic keratosis noted on the skin reassurance given no melanoma is noted  Follow-up in 6 months  Covid booster recommended

## 2020-03-05 LAB — CBC WITH DIFFERENTIAL/PLATELET
Basophils Absolute: 0.2 10*3/uL (ref 0.0–0.2)
Basos: 2 %
EOS (ABSOLUTE): 0.4 10*3/uL (ref 0.0–0.4)
Eos: 5 %
Hematocrit: 43.7 % (ref 34.0–46.6)
Hemoglobin: 14.9 g/dL (ref 11.1–15.9)
Immature Grans (Abs): 0.1 10*3/uL (ref 0.0–0.1)
Immature Granulocytes: 1 %
Lymphocytes Absolute: 1.7 10*3/uL (ref 0.7–3.1)
Lymphs: 20 %
MCH: 30.7 pg (ref 26.6–33.0)
MCHC: 34.1 g/dL (ref 31.5–35.7)
MCV: 90 fL (ref 79–97)
Monocytes Absolute: 0.5 10*3/uL (ref 0.1–0.9)
Monocytes: 6 %
Neutrophils Absolute: 5.7 10*3/uL (ref 1.4–7.0)
Neutrophils: 66 %
Platelets: 313 10*3/uL (ref 150–450)
RBC: 4.85 x10E6/uL (ref 3.77–5.28)
RDW: 12.8 % (ref 11.7–15.4)
WBC: 8.5 10*3/uL (ref 3.4–10.8)

## 2020-03-05 LAB — COMPREHENSIVE METABOLIC PANEL
ALT: 14 IU/L (ref 0–32)
AST: 17 IU/L (ref 0–40)
Albumin/Globulin Ratio: 1.7 (ref 1.2–2.2)
Albumin: 4.8 g/dL (ref 3.8–4.8)
Alkaline Phosphatase: 114 IU/L (ref 44–121)
BUN/Creatinine Ratio: 16 (ref 12–28)
BUN: 15 mg/dL (ref 8–27)
Bilirubin Total: <0.2 mg/dL (ref 0.0–1.2)
CO2: 20 mmol/L (ref 20–29)
Calcium: 9.6 mg/dL (ref 8.7–10.3)
Chloride: 104 mmol/L (ref 96–106)
Creatinine, Ser: 0.93 mg/dL (ref 0.57–1.00)
GFR calc Af Amer: 77 mL/min/{1.73_m2} (ref >59)
GFR calc non Af Amer: 67 mL/min/{1.73_m2} (ref >59)
Globulin, Total: 2.8 g/dL (ref 1.5–4.5)
Glucose: 96 mg/dL (ref 65–99)
Potassium: 4 mmol/L (ref 3.5–5.2)
Sodium: 141 mmol/L (ref 134–144)
Total Protein: 7.6 g/dL (ref 6.0–8.5)

## 2020-03-05 LAB — LIPID PANEL
Chol/HDL Ratio: 3.5 ratio (ref 0.0–4.4)
Cholesterol, Total: 256 mg/dL — ABNORMAL HIGH (ref 100–199)
HDL: 73 mg/dL (ref >39)
LDL Chol Calc (NIH): 159 mg/dL — ABNORMAL HIGH (ref 0–99)
Triglycerides: 136 mg/dL (ref 0–149)
VLDL Cholesterol Cal: 24 mg/dL (ref 5–40)

## 2020-03-05 LAB — FERRITIN: Ferritin: 30 ng/mL (ref 15–150)

## 2020-03-06 ENCOUNTER — Encounter: Payer: Self-pay | Admitting: Family Medicine

## 2020-03-11 DIAGNOSIS — R5382 Chronic fatigue, unspecified: Secondary | ICD-10-CM | POA: Diagnosis not present

## 2020-03-11 DIAGNOSIS — G894 Chronic pain syndrome: Secondary | ICD-10-CM | POA: Diagnosis not present

## 2020-03-11 DIAGNOSIS — G35 Multiple sclerosis: Secondary | ICD-10-CM | POA: Diagnosis not present

## 2020-03-11 DIAGNOSIS — R262 Difficulty in walking, not elsewhere classified: Secondary | ICD-10-CM | POA: Diagnosis not present

## 2020-03-11 DIAGNOSIS — Z79899 Other long term (current) drug therapy: Secondary | ICD-10-CM | POA: Diagnosis not present

## 2020-03-25 DIAGNOSIS — M25552 Pain in left hip: Secondary | ICD-10-CM | POA: Diagnosis not present

## 2020-03-25 DIAGNOSIS — M1712 Unilateral primary osteoarthritis, left knee: Secondary | ICD-10-CM | POA: Diagnosis not present

## 2020-03-27 ENCOUNTER — Other Ambulatory Visit: Payer: Self-pay

## 2020-03-27 ENCOUNTER — Encounter (HOSPITAL_COMMUNITY)
Admission: RE | Admit: 2020-03-27 | Discharge: 2020-03-27 | Disposition: A | Discharge status: Home / Self Care | Payer: Medicare Other | Source: Ambulatory Visit | Attending: Neurology | Admitting: Neurology

## 2020-03-27 ENCOUNTER — Encounter (HOSPITAL_COMMUNITY): Payer: Self-pay

## 2020-03-27 DIAGNOSIS — G35 Multiple sclerosis: Secondary | ICD-10-CM | POA: Insufficient documentation

## 2020-03-27 MED ORDER — SODIUM CHLORIDE 0.9 % IV SOLN: Freq: Once | INTRAVENOUS | Status: AC

## 2020-03-27 MED ORDER — SODIUM CHLORIDE 0.9 % IV SOLN
600.0000 mg | Freq: Once | INTRAVENOUS | Status: AC
  Administered 2020-03-27: 600 mg via INTRAVENOUS
  Filled 2020-03-27: qty 20

## 2020-03-27 MED ORDER — METHYLPREDNISOLONE SODIUM SUCC 125 MG IJ SOLR
INTRAMUSCULAR | Status: AC
  Filled 2020-03-27: qty 2

## 2020-03-27 MED ORDER — DIPHENHYDRAMINE HCL 50 MG/ML IJ SOLN
50.0000 mg | Freq: Once | INTRAMUSCULAR | Status: AC
  Administered 2020-03-27: 50 mg via INTRAVENOUS

## 2020-03-27 MED ORDER — ACETAMINOPHEN 325 MG PO TABS
650.0000 mg | ORAL_TABLET | Freq: Once | ORAL | Status: AC
  Administered 2020-03-27: 650 mg via ORAL

## 2020-03-27 MED ORDER — METHYLPREDNISOLONE SODIUM SUCC 125 MG IJ SOLR
100.0000 mg | Freq: Once | INTRAMUSCULAR | Status: AC
  Administered 2020-03-27: 100 mg via INTRAVENOUS

## 2020-05-01 ENCOUNTER — Other Ambulatory Visit (INDEPENDENT_AMBULATORY_CARE_PROVIDER_SITE_OTHER): Payer: Self-pay | Admitting: Internal Medicine

## 2020-06-03 DIAGNOSIS — G4733 Obstructive sleep apnea (adult) (pediatric): Secondary | ICD-10-CM | POA: Diagnosis not present

## 2020-06-03 DIAGNOSIS — G35 Multiple sclerosis: Secondary | ICD-10-CM | POA: Diagnosis not present

## 2020-06-03 DIAGNOSIS — Z79899 Other long term (current) drug therapy: Secondary | ICD-10-CM | POA: Diagnosis not present

## 2020-06-03 DIAGNOSIS — R5382 Chronic fatigue, unspecified: Secondary | ICD-10-CM | POA: Diagnosis not present

## 2020-06-03 DIAGNOSIS — R262 Difficulty in walking, not elsewhere classified: Secondary | ICD-10-CM | POA: Diagnosis not present

## 2020-06-03 DIAGNOSIS — G894 Chronic pain syndrome: Secondary | ICD-10-CM | POA: Diagnosis not present

## 2020-06-12 ENCOUNTER — Encounter: Payer: Self-pay | Admitting: Family Medicine

## 2020-06-12 DIAGNOSIS — M25569 Pain in unspecified knee: Secondary | ICD-10-CM

## 2020-06-12 NOTE — Addendum Note (Signed)
Addended by: Vicente Males on: 06/12/2020 05:03 PM   Modules accepted: Orders

## 2020-06-12 NOTE — Telephone Encounter (Signed)
Nurses  I recommend Dr Maureen Ralphs in [place] for her knee Please go ahead with placing the referral  It could take a little while to get in with him but he would be my top choice  There are others if there is an insurance issue  Thanks  Dr Nicki Reaper

## 2020-06-27 ENCOUNTER — Other Ambulatory Visit: Payer: Self-pay | Admitting: Family Medicine

## 2020-06-27 ENCOUNTER — Other Ambulatory Visit (INDEPENDENT_AMBULATORY_CARE_PROVIDER_SITE_OTHER): Payer: Self-pay | Admitting: Internal Medicine

## 2020-06-28 NOTE — Telephone Encounter (Signed)
Please schedule visit and then route back for refill

## 2020-06-28 NOTE — Telephone Encounter (Signed)
Sent my chart message to schedule appointment.

## 2020-06-28 NOTE — Telephone Encounter (Signed)
May have 90-day needs follow-up this spring

## 2020-07-08 DIAGNOSIS — M1712 Unilateral primary osteoarthritis, left knee: Secondary | ICD-10-CM | POA: Diagnosis not present

## 2020-07-09 ENCOUNTER — Other Ambulatory Visit: Payer: Self-pay

## 2020-07-09 ENCOUNTER — Encounter (INDEPENDENT_AMBULATORY_CARE_PROVIDER_SITE_OTHER): Payer: Self-pay | Admitting: Internal Medicine

## 2020-07-09 ENCOUNTER — Ambulatory Visit (INDEPENDENT_AMBULATORY_CARE_PROVIDER_SITE_OTHER): Payer: Medicare Other | Admitting: Internal Medicine

## 2020-07-09 VITALS — BP 136/78 | HR 93 | Temp 98.3°F | Ht 69.0 in | Wt 235.0 lb

## 2020-07-09 DIAGNOSIS — K2270 Barrett's esophagus without dysplasia: Secondary | ICD-10-CM

## 2020-07-09 DIAGNOSIS — K219 Gastro-esophageal reflux disease without esophagitis: Secondary | ICD-10-CM

## 2020-07-09 MED ORDER — OMEPRAZOLE 40 MG PO CPDR: 40.0000 mg | DELAYED_RELEASE_CAPSULE | Freq: Every day | ORAL | 3 refills | Status: DC

## 2020-07-09 MED ORDER — FERROUS SULFATE 325 (65 FE) MG PO TABS: 325.0000 mg | ORAL_TABLET | Freq: Every day | ORAL | 3 refills | Status: DC

## 2020-07-09 NOTE — Progress Notes (Signed)
Presenting complaint;  Follow for chronic GERD.  Database and subjective:  Patient is 60 year old Caucasian female who has chronic GERD complicated by short segment Barrett's esophagus who is here for scheduled visit.  She also has history of IBS both diarrhea and constipation.  Her last EGD was in April 2015.  EGD was not scheduled because of ongoing Covid pandemic. She has screening colonoscopy in April 2015 as well revealing small external hemorrhoids and anal papillae.  Patient states she has intermittent heartburn.  She states she has used Rolaids 7 times in the last 1 month.  She admits that she has not been watching her diet.  Because of limitations resulting from Covid she has not been able to spend more time with her children and grandchildren and at times she feels depressed.  She has gained 13 pounds since her last visit 6 months ago.  She says her dysphagia is about the same she has difficulty with bread and meat loaf.  She does not have difficulty with soft foods.  She remains with irregular bowel movements.  She has not use Dulcolax suppository.  Therefore we removed from her list.  She denies melena or rectal bleeding.  She states she had bilateral cataract surgery in December last year. She also complains of pain in her left knee.  She has severe arthritis.  She was evaluated by orthopedic surgeon at [facility] who recommended against surgery because of muscle atrophy possibly resulting from her MS.  She is thinking of getting second opinion.  She has had steroid injections but they only helped her for few days to few weeks.  Current Medications: Outpatient Encounter Medications as of 07/09/2020  Medication Sig  . amLODipine (NORVASC) 5 MG tablet TAKE 1 TABLET BY MOUTH  DAILY  . amphetamine-dextroamphetamine (ADDERALL) 10 MG tablet dextroamphetamine-amphetamine 10 mg tablet  . baclofen (LIORESAL) 10 MG tablet Take 10 mg by mouth 2 (two) times daily.   . bisacodyl (DULCOLAX) 10 MG  suppository Place 1 suppository (10 mg total) rectally as needed for moderate constipation.  . Cholecalciferol (VITAMIN D3) 3000 UNITS TABS Take 1 tablet by mouth daily. 50,000 u weekly  . diazepam (VALIUM) 10 MG tablet Take 10 mg by mouth 3 (three) times daily as needed. One tablet by mouth 4 times daily  . docusate sodium (COLACE) 100 MG capsule Take 100 mg by mouth daily. One caplet by mouth  Two times a day  . DULoxetine (CYMBALTA) 30 MG capsule Take 60 mg by mouth daily.   . famotidine (PEPCID) 20 MG tablet TAKE 1 TABLET BY MOUTH AT  BEDTIME  . ferrous sulfate (FERROUSUL) 325 (65 FE) MG tablet Take 1 tablet (325 mg total) by mouth 2 (two) times daily with a meal.  . fish oil-omega-3 fatty acids 1000 MG capsule Take by mouth daily. One cap by mouth daily  . gabapentin (NEURONTIN) 600 MG tablet Take 600 mg by mouth 3 (three) times daily.   Marland Kitchen HYDROmorphone (DILAUDID) 4 MG tablet Take 4 mg by mouth every 12 (twelve) hours as needed for severe pain.  Marland Kitchen losartan (COZAAR) 100 MG tablet TAKE 1 TABLET BY MOUTH  DAILY  . modafinil (PROVIGIL) 200 MG tablet 200 mg 2 (two) times daily.   . multivitamin (THERAGRAN) per tablet Take 1 tablet by mouth daily. One tablet by mouth once daily  . Ocrelizumab (OCREVUS IV) Inject into the vein. Infused every 6 months.  Marland Kitchen omeprazole (PRILOSEC) 40 MG capsule TAKE 1 CAPSULE BY MOUTH ONCE DAILY.  Marland Kitchen  oxybutynin (DITROPAN-XL) 5 MG 24 hr tablet Take 5 mg by mouth at bedtime.  . polyethylene glycol powder (GLYCOLAX/MIRALAX) 17 GM/SCOOP powder MIX 25.5 GRAMS WITH WATER OR JUICE ONCE DAILY.  Marland Kitchen potassium chloride (K-DUR) 10 MEQ tablet Take 1 tablet (10 mEq total) by mouth 2 (two) times daily.  Marland Kitchen topiramate (TOPAMAX) 100 MG tablet Take 100 mg by mouth daily.  . traMADol (ULTRAM) 50 MG tablet Take 100 mg by mouth 2 (two) times daily.   . vitamin E 1000 UNIT capsule Take 1,000 Units by mouth daily.  Marland Kitchen aspirin (ASPIRIN LOW DOSE) 81 MG EC tablet Take 81 mg by mouth 2 (two) times  daily. One tablet by mouth once daily   Facility-Administered Encounter Medications as of 07/09/2020  Medication  . Influenza (>/= 3 years) inactive virus vaccine (FLVIRIN/FLUZONE) injection SUSP 0.5 mL     Objective: Blood pressure 136/78, pulse 93, temperature 98.3 F (36.8 C), temperature source Oral, height '5\' 9"'  (1.753 m), weight 235 lb (106.6 kg). Patient is alert and in no acute distress. She is wearing a mask. Conjunctiva is pink. Sclera is nonicteric Oropharyngeal mucosa is normal. No neck masses or thyromegaly noted. Cardiac exam with regular rhythm normal S1 and S2. No murmur or gallop noted. Lungs are clear to auscultation. Abdomen is full and soft on palpation mild midepigastric tenderness.  No organomegaly or masses. No LE edema or clubbing noted.  Labs/studies Results:  CBC Latest Ref Rng & Units 03/04/2020 03/25/2018 08/11/2017  WBC 3.4 - 10.8 x10E3/uL 8.5 5.1 4.7  Hemoglobin 11.1 - 15.9 g/dL 14.9 14.4 14.2  Hematocrit 34.0 - 46.6 % 43.7 42.4 41.9  Platelets 150 - 450 x10E3/uL 313 225 237    CMP Latest Ref Rng & Units 03/04/2020 06/28/2018 05/02/2018  Glucose 65 - 99 mg/dL 96 - 104(H)  BUN 8 - 27 mg/dL 15 - 10  Creatinine 0.57 - 1.00 mg/dL 0.93 - 0.88  Sodium 134 - 144 mmol/L 141 - 143  Potassium 3.5 - 5.2 mmol/L 4.0 - 3.6  Chloride 96 - 106 mmol/L 104 - 107(H)  CO2 20 - 29 mmol/L 20 - 21  Calcium 8.7 - 10.3 mg/dL 9.6 - 9.4  Total Protein 6.0 - 8.5 g/dL 7.6 7.4 -  Total Bilirubin 0.0 - 1.2 mg/dL <0.2 0.3 -  Alkaline Phos 44 - 121 IU/L 114 140(H) -  AST 0 - 40 IU/L 17 17 -  ALT 0 - 32 IU/L 14 14 -    Hepatic Function Latest Ref Rng & Units 03/04/2020 06/28/2018 03/25/2018  Total Protein 6.0 - 8.5 g/dL 7.6 7.4 7.2  Albumin 3.8 - 4.8 g/dL 4.8 4.6 3.9  AST 0 - 40 IU/L '17 17 20  ' ALT 0 - 32 IU/L '14 14 16  ' Alk Phosphatase 44 - 121 IU/L 114 140(H) 110  Total Bilirubin 0.0 - 1.2 mg/dL <0.2 0.3 0.8  Bilirubin, Direct 0.00 - 0.40 mg/dL - 0.09 -    Lab data from  03/04/2020 reviewed. Normal CBC and LFTs.  Assessment:  #1.  Chronic GERD complicated by short segment Barrett's esophagus.  She is having breakthrough symptoms which unfortunately is due to dietary discretion and weight loss is also not helping.  She needs to stay away from trigger foods.  She also needs to lose weight which would help.  Last EGD was almost 7 years ago.  Will schedule EGD later this year.  #2.  Esophageal dysphagia most likely secondary to motility disorder.  May consider esophageal dilation at  the time of EGD.  If dysphagia worsens would consider manometry.  Plan:  Antireflux measures reinforced. Patient needs to make an effort to lose at least 13 pounds that she has gained in the last 6 months. New prescription for omeprazole sent to patient's pharmacy for 3 months with 3 refills. Office visit in 6 months.     \

## 2020-07-09 NOTE — Patient Instructions (Signed)
Prescription for omeprazole sent to [facility].

## 2020-07-25 NOTE — Telephone Encounter (Signed)
Contacted twice to schedule medication follow up left message and sent my chart message also

## 2020-07-30 ENCOUNTER — Other Ambulatory Visit: Payer: Self-pay

## 2020-07-30 MED ORDER — AMLODIPINE BESYLATE 5 MG PO TABS: 5.0000 mg | ORAL_TABLET | Freq: Every day | ORAL | 0 refills | Status: DC

## 2020-08-15 ENCOUNTER — Encounter: Payer: Self-pay | Admitting: Family Medicine

## 2020-08-15 ENCOUNTER — Other Ambulatory Visit: Payer: Self-pay

## 2020-08-15 ENCOUNTER — Ambulatory Visit (INDEPENDENT_AMBULATORY_CARE_PROVIDER_SITE_OTHER): Payer: Medicare Other | Admitting: Family Medicine

## 2020-08-15 ENCOUNTER — Telehealth: Payer: Self-pay | Admitting: Family Medicine

## 2020-08-15 VITALS — BP 134/78 | Temp 97.2°F | Wt 234.4 lb

## 2020-08-15 DIAGNOSIS — Z79899 Other long term (current) drug therapy: Secondary | ICD-10-CM

## 2020-08-15 DIAGNOSIS — I10 Essential (primary) hypertension: Secondary | ICD-10-CM

## 2020-08-15 DIAGNOSIS — E785 Hyperlipidemia, unspecified: Secondary | ICD-10-CM

## 2020-08-15 DIAGNOSIS — D509 Iron deficiency anemia, unspecified: Secondary | ICD-10-CM

## 2020-08-15 DIAGNOSIS — M25569 Pain in unspecified knee: Secondary | ICD-10-CM

## 2020-08-15 DIAGNOSIS — G35 Multiple sclerosis: Secondary | ICD-10-CM | POA: Diagnosis not present

## 2020-08-15 NOTE — Progress Notes (Signed)
   Subjective:    Patient ID: Natalie Campos, female    DOB: 1961-01-21, 60 y.o.   MRN: [id number]  Hypertension This is a chronic problem. There are no compliance problems.   Pt states she her blood pressure checked regular at doctors appt.   Pt is having constant headaches; may be stemming from pain meds She was talked to at length about the importance of treating her pain but avoiding overtreating.  If her condition improves she should consider tapering down under the guidance of her pain management doctor  We also had a discussion regarding her underlying health status and the risk of Covid but at the same time balancing out this risk Review of Systems     Objective:   Physical Exam Vitals reviewed.  Constitutional:      General: She is not in acute distress. HENT:     Head: Normocephalic and atraumatic.  Eyes:     General:        Right eye: No discharge.        Left eye: No discharge.  Neck:     Trachea: No tracheal deviation.  Cardiovascular:     Rate and Rhythm: Normal rate and regular rhythm.     Heart sounds: Normal heart sounds. No murmur heard.   Pulmonary:     Effort: Pulmonary effort is normal. No respiratory distress.     Breath sounds: Normal breath sounds.  Lymphadenopathy:     Cervical: No cervical adenopathy.  Skin:    General: Skin is warm and dry.  Neurological:     Mental Status: She is alert.     Coordination: Coordination normal.  Psychiatric:        Behavior: Behavior normal.           Assessment & Plan:  1. Iron deficiency anemia, unspecified iron deficiency anemia type Relates fatigue tiredness will check lab work has history of iron deficient anemia also could be related to her medications or MS - Lipid Profile - Basic Metabolic Panel (BMET) - CBC with Differential - Ferritin  2. Essential hypertension Blood pressure good control continue current measures watch diet closely check labs - Lipid Profile - Basic Metabolic Panel  (BMET) - CBC with Differential - Ferritin  3. Knee pain, unspecified chronicity, unspecified laterality Knee pain we will go ahead with referral to a different orthopedic group.  Recommend [facility].  4. MULTIPLE SCLEROSIS MS being followed locally by specialists  5. Hyperlipidemia, unspecified hyperlipidemia type Check cholesterol profile continue medications - Lipid Profile - Basic Metabolic Panel (BMET) - CBC with Differential - Ferritin  6. High risk medication use Watch diet closely - Lipid Profile - Basic Metabolic Panel (BMET) - CBC with Differential - Ferritin Follow-up within 6 months

## 2020-08-15 NOTE — Telephone Encounter (Signed)
Pt in for office visit today and is wanting to know if provider would recommend her getting a second opioion on her knee. Please advise. Thank you

## 2020-08-15 NOTE — Progress Notes (Signed)
Referral put in.

## 2020-08-15 NOTE — Telephone Encounter (Signed)
Please notify patient that a referral was placed to [facility].

## 2020-08-15 NOTE — Telephone Encounter (Signed)
Pt.notified

## 2020-08-26 ENCOUNTER — Encounter: Payer: Self-pay | Admitting: Family Medicine

## 2020-08-26 DIAGNOSIS — G4733 Obstructive sleep apnea (adult) (pediatric): Secondary | ICD-10-CM | POA: Diagnosis not present

## 2020-08-26 DIAGNOSIS — R262 Difficulty in walking, not elsewhere classified: Secondary | ICD-10-CM | POA: Diagnosis not present

## 2020-08-26 DIAGNOSIS — Z79899 Other long term (current) drug therapy: Secondary | ICD-10-CM | POA: Diagnosis not present

## 2020-08-26 DIAGNOSIS — R5382 Chronic fatigue, unspecified: Secondary | ICD-10-CM | POA: Diagnosis not present

## 2020-08-26 DIAGNOSIS — G35 Multiple sclerosis: Secondary | ICD-10-CM | POA: Diagnosis not present

## 2020-08-26 DIAGNOSIS — M5459 Other low back pain: Secondary | ICD-10-CM | POA: Diagnosis not present

## 2020-08-26 DIAGNOSIS — G894 Chronic pain syndrome: Secondary | ICD-10-CM | POA: Diagnosis not present

## 2020-09-04 ENCOUNTER — Ambulatory Visit: Payer: Self-pay

## 2020-09-04 ENCOUNTER — Ambulatory Visit: Payer: Medicare Other | Admitting: Orthopedic Surgery

## 2020-09-04 ENCOUNTER — Other Ambulatory Visit: Payer: Self-pay

## 2020-09-04 DIAGNOSIS — M79605 Pain in left leg: Secondary | ICD-10-CM

## 2020-09-04 DIAGNOSIS — M25552 Pain in left hip: Secondary | ICD-10-CM

## 2020-09-05 ENCOUNTER — Telehealth: Payer: Self-pay | Admitting: Orthopedic Surgery

## 2020-09-05 ENCOUNTER — Encounter: Payer: Self-pay | Admitting: Orthopedic Surgery

## 2020-09-05 DIAGNOSIS — M79605 Pain in left leg: Secondary | ICD-10-CM

## 2020-09-05 NOTE — Addendum Note (Signed)
Addended by: Robyne Peers on: 09/05/2020 01:41 PM   Modules accepted: Orders

## 2020-09-05 NOTE — Telephone Encounter (Signed)
Pt called and states that her other doctor has her doing physical therapy for her back. Does she need to hold off on that until you guys are done with the MRI? Cb [phone]

## 2020-09-05 NOTE — Telephone Encounter (Signed)
Okay for PT on back

## 2020-09-05 NOTE — Progress Notes (Signed)
Office Visit Note   Patient: Natalie Campos           Date of Birth: 06-02-1960           MRN: [id number] Visit Date: 09/04/2020 Requested by: Kathyrn Drown, MD [address] [place],  [place] [id number] PCP: Kathyrn Drown, MD  Subjective: Chief Complaint  Patient presents with  . Left Knee - Pain  . Left Hip - Pain    HPI: Natalie Campos is a 60 year old patient with left knee and leg pain here for second opinion about surgical options.  She has been receiving injections into her left knee for approximately 10 years.  These have helped some.  Patient also has MS and sees a neurologist.  She describes pain in her legs as a result of the MS.  By her history the patient states she was seen at [facility] and they did not want to do surgery because of her MS.  Patient reports periodic flareups with initial relief from injections but now she describes knee pain as well as entire leg pain.  Pain wakes her from sleep at night.  She reports weakness and giving way on that left-hand side.  She wears a knee brace.  She reports pain which radiates down the leg into the dorsal and plantar aspect of the foot.  She also describes a pulling type sensation from the medial aspect of the knee up to the groin.  No history of lumbar spine surgery.  She does have a history of epidural steroid injections but only the first 1 worked.  She denies any groin pain.  She does report global knee pain as well as global leg pain.  She is starting in physical therapy for her back in 2 weeks.  MRI scan of the left knee from late 2021 is reviewed.  Lateral compartment intact patellofemoral compartment also intact.  She has full-thickness cartilage loss over only part of her medial femoral condyle with partial thickness cartilage loss on the medial tibial plateau.              ROS: All systems reviewed are negative as they relate to the chief complaint within the history of present illness.  Patient denies  fevers or  chills.   Assessment & Plan: Visit Diagnoses:  1. Pain in left leg     Plan: Impression is left knee and leg pain which is fairly debilitating for the patient.  She had an MRI scan of the lumbar spine in 2013 which showed right-sided L5-S1 disc bulge.  No subsequent MRI scans of the lumbar spine since that time.  It is very difficult to ascribe all of her knee and leg symptoms to the fairly mild arthritis which is present and described on the MRI scan.  No effusion in the knee today.  I think based on the radiation of the pain up into the groin and down into the foot that it is possible and likely that there is a radicular component to this.  This is especially true considering the severe nature of the degenerative disc disease present in the lower lumbar spine.  No surgery on the knee indicated yet until MRI scanning of the lumbar spine can be performed.  We will see her back after that study.  If the knee does come to surgery based on her global pain I would say total knee replacement as opposed to partial knee replacement would be advised but I am not convinced at  this time that all of her symptoms are in fact coming from the  mild arthritis she has in the medial compartment.  Follow-Up Instructions: Return for after MRI.   Orders:  Orders Placed This Encounter  Procedures  . XR Lumbar Spine 2-3 Views  . XR Knee 1-2 Views Left  . XR HIP UNILAT W OR W/O PELVIS 2-3 VIEWS LEFT  . MR Lumbar Spine w/o contrast   No orders of the defined types were placed in this encounter.     Procedures: No procedures performed   Clinical Data: No additional findings.  Objective: Vital Signs: There were no vitals taken for this visit.  Physical Exam:   Constitutional: Patient appears well-developed HEENT:  Head: Normocephalic Eyes:EOM are normal Neck: Normal range of motion Cardiovascular: Normal rate Pulmonary/chest: Effort normal Neurologic: Patient is alert Skin: Skin is  warm Psychiatric: Patient has normal mood and affect    Ortho Exam: Ortho exam demonstrates patient uses a cane for ambulation.  Patient has 5 out of 5 ankle dorsiflexion plantarflexion quad hamstring strength as well as hip flexion abduction adduction strength.  No groin pain on the left right-hand side with internal ex rotation of either leg.  Mild pain with forward lateral bending.  No nerve root tension signs.  No definite paresthesias L1 S1 bilaterally.  Reflexes 0 1+ out of 4 bilateral patella and Achilles.  Pedal pulses palpable.  No edema in bilateral lower extremities.  Both knees are examined.  No effusion is present.  Minimal patellofemoral crepitus is present with passive range of motion.  Extensor mechanism is intact on the left.  Patient has mild medial greater than lateral joint line tenderness on the left-hand side.  No trochanteric tenderness is present.  No muscle atrophy left leg versus right.  No left knee effusion or right knee effusion.  Specialty Comments:  No specialty comments available.  Imaging: XR HIP UNILAT W OR W/O PELVIS 2-3 VIEWS LEFT  Result Date: 09/05/2020 [facility] pelvis lateral left hip reviewed.  Joint space maintained.  No spurring.  Bones appear osteopenic.  Lower lumbar degenerative changes noted.  No acute fracture.  XR Knee 1-2 Views Left  Result Date: 09/05/2020 [facility] lateral left knee radiographs reviewed.  Alignment intact.  No acute fracture.  Only mild medial joint space narrowing is present with no bone-on-bone changes.  No acute fractures.  XR Lumbar Spine 2-3 Views  Result Date: 09/05/2020 [facility] lateral lumbar spine radiographs reviewed.  Scoliosis is present in the lower lumbar spine.  Moderate to severe degenerative disc disease is present at L4-5 as well as in the facet joints at this level.  No spondylolisthesis present.  Degenerative changes are also present in the upper lumbar spine which are moderate.  No acute compression fractures  visualized.    PMFS History: Patient Active Problem List   Diagnosis Date Noted  . Barrett's esophagus 01/17/2019  . Hiatal hernia 08/12/2017  . Diarrhea 09/11/2015  . GERD (gastroesophageal reflux disease) 09/11/2015  . Anemia 08/01/2013  . Vertigo 10/16/2011  . Allergic reaction 10/16/2011  . FATIGUE 07/01/2010  . CAROTID BRUIT 02/27/2010  . OBESITY, UNSPECIFIED 06/24/2009  . ACUTE SINUSITIS, UNSPECIFIED 06/24/2009  . HEADACHE 06/24/2009  . Hyperlipidemia 05/23/2009  . DEPRESSION 05/23/2009  . MULTIPLE SCLEROSIS 05/23/2009  . Essential hypertension 05/23/2009  . EMPHYSEMA 05/23/2009  . GERD 05/23/2009  . IRRITABLE BOWEL SYNDROME 05/23/2009  . ECZEMA 05/23/2009  . DEGENERATIVE DISC DISEASE 05/23/2009   Past Medical History:  Diagnosis Date  .  Anemia   . Barrett's esophagus   . Bulging discs   . Depression   . DJD (degenerative joint disease)   . Dog bite(E906.0) july 29,2011  . Eczema   . Emphysema   . GERD (gastroesophageal reflux disease)   . High triglycerides   . Hyperlipidemia   . Hypertension   . IBS (irritable bowel syndrome)    contipation predominant   . Iron deficiency anemia   . Multiple sclerosis ([place]) 1990   Dx in 1990 most recent hospitalization for a flareis in 2011   . Obesity     History reviewed. No pertinent family history.  Past Surgical History:  Procedure Laterality Date  . BIOPSY  01/28/2011   Procedure: BIOPSY;  Surgeon: Rogene Houston, MD;  [facility];  Service: Endoscopy;  Laterality: N/A;  . BIOPSY N/A 08/25/2013   Procedure: BIOPSY;  Surgeon: Rogene Houston, MD;  Location: [facility];  Service: Endoscopy;  Laterality: N/A;  . CHOLECYSTECTOMY  2010   Dr. Anthony Sar   . COLONOSCOPY WITH PROPOFOL N/A 08/25/2013   Procedure: COLONOSCOPY WITH PROPOFOL;  Surgeon: Rogene Houston, MD;  Location: [facility];  Service: Endoscopy;  Laterality: N/A;  in cecum at 0811 out at 0822 = 11 minutes  . ESOPHAGOGASTRODUODENOSCOPY  01/28/2011    Procedure: ESOPHAGOGASTRODUODENOSCOPY (EGD);  Surgeon: Rogene Houston, MD;  [facility];  Service: Endoscopy;  Laterality: N/A;  1:00  . ESOPHAGOGASTRODUODENOSCOPY (EGD) WITH PROPOFOL N/A 08/25/2013   Procedure: ESOPHAGOGASTRODUODENOSCOPY (EGD) WITH PROPOFOL;  Surgeon: Rogene Houston, MD;  Location: [facility];  Service: Endoscopy;  Laterality: N/A;  . WISDOM TOOTH EXTRACTION     Social History   Occupational History  . Occupation: disabled since 2002  Tobacco Use  . Smoking status: Former Smoker    Packs/day: 1.50    Years: 30.00    Pack years: 45.00    Types: Cigarettes    Quit date: 03/08/2008    Years since quitting: 12.5  . Smokeless tobacco: Never Used  Vaping Use  . Vaping Use: Never used  Substance and Sexual Activity  . Alcohol use: No    Alcohol/week: 0.0 standard drinks  . Drug use: No  . Sexual activity: Yes    Birth control/protection: Post-menopausal

## 2020-09-05 NOTE — Telephone Encounter (Signed)
I called pt and advised. She is already doing PT elsewhere. She will continue with them. Please cancel the other referral thank you

## 2020-09-06 NOTE — Telephone Encounter (Signed)
Noted and referral has been cancelled

## 2020-09-10 DIAGNOSIS — M5459 Other low back pain: Secondary | ICD-10-CM | POA: Diagnosis not present

## 2020-09-10 DIAGNOSIS — R2689 Other abnormalities of gait and mobility: Secondary | ICD-10-CM | POA: Diagnosis not present

## 2020-09-10 DIAGNOSIS — M2569 Stiffness of other specified joint, not elsewhere classified: Secondary | ICD-10-CM | POA: Diagnosis not present

## 2020-09-12 ENCOUNTER — Telehealth: Payer: Self-pay | Admitting: Family Medicine

## 2020-09-12 NOTE — Telephone Encounter (Signed)
Patient called back requesting her AWV be done in 02/2021

## 2020-09-12 NOTE — Telephone Encounter (Signed)
Left message for patient to schedule Annual Wellness Visit.  Please schedule with Nurse Health Advisor Shannon Crews, RN at  Family Medicine  

## 2020-09-24 ENCOUNTER — Encounter (HOSPITAL_COMMUNITY)
Admission: RE | Admit: 2020-09-24 | Discharge: 2020-09-24 | Disposition: A | Discharge status: Home / Self Care | Payer: Medicare Other | Source: Ambulatory Visit | Attending: Neurology | Admitting: Neurology

## 2020-09-24 ENCOUNTER — Other Ambulatory Visit: Payer: Self-pay

## 2020-09-24 ENCOUNTER — Encounter (HOSPITAL_COMMUNITY): Payer: Self-pay

## 2020-09-24 DIAGNOSIS — G35 Multiple sclerosis: Secondary | ICD-10-CM | POA: Diagnosis not present

## 2020-09-24 MED ORDER — SODIUM CHLORIDE 0.9 % IV SOLN
Freq: Once | INTRAVENOUS | Status: AC
  Administered 2020-09-24: 250 mL via INTRAVENOUS

## 2020-09-24 MED ORDER — ACETAMINOPHEN 325 MG PO TABS
650.0000 mg | ORAL_TABLET | Freq: Once | ORAL | Status: AC
  Administered 2020-09-24: 650 mg via ORAL
  Filled 2020-09-24: qty 2

## 2020-09-24 MED ORDER — METHYLPREDNISOLONE SODIUM SUCC 125 MG IJ SOLR
100.0000 mg | Freq: Once | INTRAMUSCULAR | Status: AC
  Administered 2020-09-24: 100 mg via INTRAVENOUS
  Filled 2020-09-24: qty 2

## 2020-09-24 MED ORDER — DIPHENHYDRAMINE HCL 50 MG/ML IJ SOLN
50.0000 mg | Freq: Once | INTRAMUSCULAR | Status: AC
  Administered 2020-09-24: 50 mg via INTRAVENOUS
  Filled 2020-09-24: qty 1

## 2020-09-24 MED ORDER — SODIUM CHLORIDE 0.9 % IV SOLN
600.0000 mg | Freq: Once | INTRAVENOUS | Status: AC
  Administered 2020-09-24: 600 mg via INTRAVENOUS
  Filled 2020-09-24: qty 20

## 2020-09-27 ENCOUNTER — Ambulatory Visit (HOSPITAL_COMMUNITY)
Admission: RE | Admit: 2020-09-27 | Discharge: 2020-09-27 | Disposition: A | Discharge status: Home / Self Care | Payer: Medicare Other | Source: Ambulatory Visit | Attending: Orthopedic Surgery | Admitting: Orthopedic Surgery

## 2020-09-27 ENCOUNTER — Other Ambulatory Visit: Payer: Self-pay

## 2020-09-27 DIAGNOSIS — M79605 Pain in left leg: Secondary | ICD-10-CM | POA: Diagnosis not present

## 2020-09-27 DIAGNOSIS — M545 Low back pain, unspecified: Secondary | ICD-10-CM | POA: Diagnosis not present

## 2020-09-30 ENCOUNTER — Other Ambulatory Visit: Payer: Self-pay | Admitting: Family Medicine

## 2020-09-30 NOTE — Telephone Encounter (Signed)
Nope 90-day with 1 refill is okay

## 2020-10-02 ENCOUNTER — Other Ambulatory Visit: Payer: Self-pay

## 2020-10-02 ENCOUNTER — Ambulatory Visit: Payer: Medicare Other | Admitting: Orthopedic Surgery

## 2020-10-02 ENCOUNTER — Encounter: Payer: Self-pay | Admitting: Orthopedic Surgery

## 2020-10-02 DIAGNOSIS — M79605 Pain in left leg: Secondary | ICD-10-CM | POA: Diagnosis not present

## 2020-10-05 ENCOUNTER — Encounter: Payer: Self-pay | Admitting: Orthopedic Surgery

## 2020-10-05 NOTE — Progress Notes (Signed)
Office Visit Note   Patient: Natalie Campos           Date of Birth: 02-04-1961           MRN: [id number] Visit Date: 10/02/2020 Requested by: Kathyrn Drown, MD [address],  [place] [id number] PCP: Kathyrn Drown, MD  Subjective: Chief Complaint  Patient presents with  . Lower Back - Pain    HPI: Neoma Laming is a 60 year old patient with left knee and leg pain.  She has chemotherapy twice a year for her MS.  Back hurts more than the left knee.  She uses a corset.  She had 3 epidural steroid injections in 2018.  More therapy is planned.  MRI scan shows degenerative disc narrowing which is right-sided eccentric.  Moderate foraminal impingement noted on the right-hand side.  Similar degree of lumbar spine degeneration with mild scoliosis compared to 2018.  MRI scan on the knees shows some mild medial compartment arthritis but the lateral and patellofemoral compartments are spared.  Only partial thickness cartilage loss within the medial compartment.              ROS: All systems reviewed are negative as they relate to the chief complaint within the history of present illness.  Patient denies  fevers or chills.   Assessment & Plan: Visit Diagnoses:  1. Pain in left leg     Plan: Impression is back greater than knee pain.  Therapy is coming in about 2 weeks.  I think a good option for Debbie at this time would be epidural steroid injection of the lumbar spine.  Hard to determine if based on the mild to moderate amount of arthritis she has in only 1 compartment of the knee whether or not any type of arthroplasty is indicated.  At this time I think the back is the bigger problem than the knee.  We will see how she does with the injections.  Follow-Up Instructions: No follow-ups on file.   Orders:  No orders of the defined types were placed in this encounter.  No orders of the defined types were placed in this encounter.     Procedures: No procedures  performed   Clinical Data: No additional findings.  Objective: Vital Signs: There were no vitals taken for this visit.  Physical Exam:   Constitutional: Patient appears well-developed HEENT:  Head: Normocephalic Eyes:EOM are normal Neck: Normal range of motion Cardiovascular: Normal rate Pulmonary/chest: Effort normal Neurologic: Patient is alert Skin: Skin is warm Psychiatric: Patient has normal mood and affect    Ortho Exam: Ortho exam demonstrates no knee effusion bilaterally.  Extensor mechanism intact.  No groin pain with internal ex rotation of the leg.  No definite nerve root tension signs.  Strength seems pretty symmetric to ankle dorsiflexion plantarflexion quad hamstring testing.  Specialty Comments:  No specialty comments available.  Imaging: No results found.   PMFS History: Patient Active Problem List   Diagnosis Date Noted  . Barrett's esophagus 01/17/2019  . Hiatal hernia 08/12/2017  . Diarrhea 09/11/2015  . GERD (gastroesophageal reflux disease) 09/11/2015  . Anemia 08/01/2013  . Vertigo 10/16/2011  . Allergic reaction 10/16/2011  . FATIGUE 07/01/2010  . CAROTID BRUIT 02/27/2010  . OBESITY, UNSPECIFIED 06/24/2009  . ACUTE SINUSITIS, UNSPECIFIED 06/24/2009  . HEADACHE 06/24/2009  . Hyperlipidemia 05/23/2009  . DEPRESSION 05/23/2009  . MULTIPLE SCLEROSIS 05/23/2009  . Essential hypertension 05/23/2009  . EMPHYSEMA 05/23/2009  . GERD 05/23/2009  . IRRITABLE  BOWEL SYNDROME 05/23/2009  . ECZEMA 05/23/2009  . DEGENERATIVE DISC DISEASE 05/23/2009   Past Medical History:  Diagnosis Date  . Anemia   . Barrett's esophagus   . Bulging discs   . Depression   . DJD (degenerative joint disease)   . Dog bite(E906.0) july 29,2011  . Eczema   . Emphysema   . GERD (gastroesophageal reflux disease)   . High triglycerides   . Hyperlipidemia   . Hypertension   . IBS (irritable bowel syndrome)    contipation predominant   . Iron deficiency anemia    . Multiple sclerosis ([place]) 1990   Dx in 1990 most recent hospitalization for a flareis in 2011   . Obesity     History reviewed. No pertinent family history.  Past Surgical History:  Procedure Laterality Date  . BIOPSY  01/28/2011   Procedure: BIOPSY;  Surgeon: Rogene Houston, MD;  [facility];  Service: Endoscopy;  Laterality: N/A;  . BIOPSY N/A 08/25/2013   Procedure: BIOPSY;  Surgeon: Rogene Houston, MD;  Location: [facility];  Service: Endoscopy;  Laterality: N/A;  . CHOLECYSTECTOMY  2010   Dr. Anthony Sar   . COLONOSCOPY WITH PROPOFOL N/A 08/25/2013   Procedure: COLONOSCOPY WITH PROPOFOL;  Surgeon: Rogene Houston, MD;  Location: [facility];  Service: Endoscopy;  Laterality: N/A;  in cecum at 0811 out at 0822 = 11 minutes  . ESOPHAGOGASTRODUODENOSCOPY  01/28/2011   Procedure: ESOPHAGOGASTRODUODENOSCOPY (EGD);  Surgeon: Rogene Houston, MD;  [facility];  Service: Endoscopy;  Laterality: N/A;  1:00  . ESOPHAGOGASTRODUODENOSCOPY (EGD) WITH PROPOFOL N/A 08/25/2013   Procedure: ESOPHAGOGASTRODUODENOSCOPY (EGD) WITH PROPOFOL;  Surgeon: Rogene Houston, MD;  Location: [facility];  Service: Endoscopy;  Laterality: N/A;  . WISDOM TOOTH EXTRACTION     Social History   Occupational History  . Occupation: disabled since 2002  Tobacco Use  . Smoking status: Former Smoker    Packs/day: 1.50    Years: 30.00    Pack years: 45.00    Types: Cigarettes    Quit date: 03/08/2008    Years since quitting: 12.5  . Smokeless tobacco: Never Used  Vaping Use  . Vaping Use: Never used  Substance and Sexual Activity  . Alcohol use: No    Alcohol/week: 0.0 standard drinks  . Drug use: No  . Sexual activity: Yes    Birth control/protection: Post-menopausal

## 2020-10-08 ENCOUNTER — Encounter: Payer: Self-pay | Admitting: Orthopedic Surgery

## 2020-10-08 NOTE — Telephone Encounter (Signed)
Not sure what the paper is but my note did not say anything about 3 years of back pain.

## 2020-10-08 NOTE — Telephone Encounter (Signed)
Looks like she is referring to "clinical data" part of the MRI report rather than your note

## 2020-10-14 DIAGNOSIS — H47292 Other optic atrophy, left eye: Secondary | ICD-10-CM | POA: Diagnosis not present

## 2020-11-02 ENCOUNTER — Other Ambulatory Visit: Payer: Self-pay | Admitting: Family Medicine

## 2020-11-04 ENCOUNTER — Telehealth: Payer: Self-pay | Admitting: Orthopedic Surgery

## 2020-11-04 ENCOUNTER — Other Ambulatory Visit: Payer: Self-pay

## 2020-11-04 DIAGNOSIS — M79605 Pain in left leg: Secondary | ICD-10-CM

## 2020-11-04 NOTE — Telephone Encounter (Signed)
Called pt and spoken with pt and will call back once inj is approve.

## 2020-11-04 NOTE — Telephone Encounter (Signed)
Patient called advised she was suppose to be set up to have an epidural. Patient is asking for an update on getting the epidural scheduled. The number to contact patient is [phone]

## 2020-11-05 ENCOUNTER — Telehealth: Payer: Self-pay

## 2020-11-05 NOTE — Telephone Encounter (Signed)
Please Advise

## 2020-11-05 NOTE — Telephone Encounter (Signed)
No anesthesia but valium possible thx

## 2020-11-05 NOTE — Telephone Encounter (Signed)
See below

## 2020-11-05 NOTE — Telephone Encounter (Signed)
Pt called having questions about seeing Dr. Ernestina Patches.  She would like to know if she can be under anesthesia or if we offer that. She just wants to know her options.

## 2020-11-07 ENCOUNTER — Other Ambulatory Visit: Payer: Self-pay

## 2020-11-07 DIAGNOSIS — M79605 Pain in left leg: Secondary | ICD-10-CM

## 2020-11-14 ENCOUNTER — Telehealth: Payer: Self-pay

## 2020-11-14 NOTE — Telephone Encounter (Signed)
Patient called she stated she received a message, did you call patient? Call [phone]

## 2020-11-18 NOTE — Telephone Encounter (Signed)
I did not call the patient. Looks like she requested a referral to a pain clinic that can give her sedation for ESI and this referral has been placed.

## 2020-12-02 DIAGNOSIS — G35 Multiple sclerosis: Secondary | ICD-10-CM | POA: Diagnosis not present

## 2020-12-02 DIAGNOSIS — G894 Chronic pain syndrome: Secondary | ICD-10-CM | POA: Diagnosis not present

## 2020-12-02 DIAGNOSIS — M5459 Other low back pain: Secondary | ICD-10-CM | POA: Diagnosis not present

## 2020-12-02 DIAGNOSIS — R5382 Chronic fatigue, unspecified: Secondary | ICD-10-CM | POA: Diagnosis not present

## 2020-12-02 DIAGNOSIS — Z79899 Other long term (current) drug therapy: Secondary | ICD-10-CM | POA: Diagnosis not present

## 2020-12-02 DIAGNOSIS — G4733 Obstructive sleep apnea (adult) (pediatric): Secondary | ICD-10-CM | POA: Diagnosis not present

## 2020-12-02 DIAGNOSIS — R262 Difficulty in walking, not elsewhere classified: Secondary | ICD-10-CM | POA: Diagnosis not present

## 2020-12-07 ENCOUNTER — Encounter: Payer: Self-pay | Admitting: Orthopedic Surgery

## 2020-12-09 NOTE — Telephone Encounter (Signed)
Dr. Nelva Bush and Dr. Maia Petties are both able to do these with sedation I think.

## 2020-12-09 NOTE — Telephone Encounter (Signed)
It looks like when we called the patient to schedule she asked to be referred to someone who could do the injection with sedation. New outside referral was placed on 6/28.

## 2020-12-10 ENCOUNTER — Telehealth: Payer: Self-pay | Admitting: Physical Medicine and Rehabilitation

## 2020-12-10 NOTE — Telephone Encounter (Signed)
Pt wanting to get sch for an appt with Dr. Ernestina Patches. Pt states she was supposed to be called to get an appt sch but never heard back. The best call back number [phone].

## 2020-12-10 NOTE — Telephone Encounter (Signed)
Scheduled per patient request from June 2022 referral. Patient does want this with Dr. Ernestina Patches.

## 2020-12-12 NOTE — Telephone Encounter (Signed)
Pt decided to have injection done with [facility]

## 2020-12-19 ENCOUNTER — Ambulatory Visit: Payer: Medicare Other | Admitting: Physical Medicine and Rehabilitation

## 2020-12-19 ENCOUNTER — Encounter: Payer: Self-pay | Admitting: Physical Medicine and Rehabilitation

## 2020-12-19 ENCOUNTER — Other Ambulatory Visit: Payer: Self-pay

## 2020-12-19 ENCOUNTER — Ambulatory Visit: Payer: Self-pay

## 2020-12-19 VITALS — BP 142/85 | HR 102

## 2020-12-19 DIAGNOSIS — M5416 Radiculopathy, lumbar region: Secondary | ICD-10-CM

## 2020-12-19 MED ORDER — BETAMETHASONE SOD PHOS & ACET 6 (3-3) MG/ML IJ SUSP
12.0000 mg | Freq: Once | INTRAMUSCULAR | Status: AC
  Administered 2020-12-19: 12 mg

## 2020-12-19 NOTE — Progress Notes (Signed)
Pt state lower back pain that travels down both legs, mostly her left side. Pt state bending, walking and standing makes the pain worse. Pt state she takes pain meds and heating to help ease her pain.  Numeric Pain Rating Scale and Functional Assessment Average Pain 7   In the last MONTH (on 0-10 scale) has pain interfered with the following?  1. General activity like being  able to carry out your everyday physical activities such as walking, climbing stairs, carrying groceries, or moving a chair?  Rating(10)   +Driver, -BT, -Dye Allergies.

## 2020-12-19 NOTE — Patient Instructions (Signed)

## 2020-12-20 NOTE — Progress Notes (Signed)
Natalie Campos - 60 y.o. female MRN [id number]  Date of birth: 1960-06-05  Office Visit Note: Visit Date: 12/19/2020 PCP: Kathyrn Drown, MD Referred by: Kathyrn Drown, MD  Subjective: Chief Complaint  Patient presents with   Lower Back - Pain   Left Leg - Pain   Right Leg - Pain   HPI:  Natalie Campos is a 60 y.o. female who comes in today at the request of Dr. Anderson Malta for planned Right L5-S1 Lumbar Interlaminar epidural steroid injection with fluoroscopic guidance.  The patient has failed conservative care including home exercise, medications, time and activity modification.  This injection will be diagnostic and hopefully therapeutic.  Please see requesting physician notes for further details and justification. MRI reviewed with images and spine model.  MRI reviewed in the note below.  She reports a history of prior epidural injections which were interesting.  She initially had an injection by Dr. Suella Broad at the surgery center with sedation and did well and it actually helped for a year and this was several years ago.  She is unsure really exactly what was done.  She had a second injection by Dr. Nelva Bush where she reports it was in his office and he basically straddled her and completed multiple multiple injections and she had what sounds like a vasovagal response after that.  Obviously that seem to sensitize her to not wanting injections at that point.  Ultimately she saw Dr. Sherwood Gambler at [place] who also did injections under pretty significant sedation.  The first injection she had with him did not help.  He was going to do another injection but at that point the [place] pandemic hit and she was unable to complete that.  We did talk today about single interlaminar epidural steroid injection in the office.  We went over this at great length with her and I think she will do fine.  She does have a history of significant anxiety and chronic pain syndrome.   Interestingly she has historically over the last year had mostly right low back and hip and leg pain but more recent left but the right is still worse than the left.  She is been dealing with her left knee with Dr. Marlou Sa.  MRI findings are consistent with right L5-S1 disc osteophyte right paracentral probably impacting the L5 or S1 nerve root.  Working to complete a right L5-S1 interlaminar injection which should spread to both sides.  Depending on diagnostic issues on the left we could always look at that.     ROS Otherwise per HPI.  Assessment & Plan: Visit Diagnoses:    ICD-10-CM   1. Lumbar radiculopathy  M54.16 XR C-ARM NO REPORT    Epidural Steroid injection    betamethasone acetate-betamethasone sodium phosphate (CELESTONE) injection 12 mg      Plan: No additional findings.   Meds & Orders:  Meds ordered this encounter  Medications   betamethasone acetate-betamethasone sodium phosphate (CELESTONE) injection 12 mg    Orders Placed This Encounter  Procedures   XR C-ARM NO REPORT   Epidural Steroid injection    Follow-up: Return if symptoms worsen or fail to improve.   Procedures: No procedures performed  Lumbar Epidural Steroid Injection - Interlaminar Approach with Fluoroscopic Guidance  Patient: Natalie Campos      Date of Birth: 05-28-1960 MRN: [id number] PCP: Kathyrn Drown, MD      Visit Date: 12/19/2020   Universal Protocol:  Consent Given By: the patient  Position: PRONE  Additional Comments: Vital signs were monitored before and after the procedure. Patient was prepped and draped in the usual sterile fashion. The correct patient, procedure, and site was verified.   Injection Procedure Details:   Procedure diagnoses: Lumbar radiculopathy [M54.16]   Meds Administered:  Meds ordered this encounter  Medications   betamethasone acetate-betamethasone sodium phosphate (CELESTONE) injection 12 mg     Laterality: Right  Location/Site:   L5-S1  Needle: 3.5 in., 20 ga. Tuohy, 4.5 in would be better choice in future  Needle Placement: Paramedian epidural  Findings:   -Comments: Excellent flow of contrast into the epidural space.  Procedure Details: Using a paramedian approach from the side mentioned above, the region overlying the inferior lamina was localized under fluoroscopic visualization and the soft tissues overlying this structure were infiltrated with 4 ml. of 1% Lidocaine without Epinephrine. The Tuohy needle was inserted into the epidural space using a paramedian approach.   The epidural space was localized using loss of resistance along with counter oblique bi-planar fluoroscopic views.  After negative aspirate for air, blood, and CSF, a 2 ml. volume of Isovue-250 was injected into the epidural space and the flow of contrast was observed. Radiographs were obtained for documentation purposes.    The injectate was administered into the level noted above.   Additional Comments:  The patient tolerated the procedure well Dressing: 2 x 2 sterile gauze and Band-Aid    Post-procedure details: Patient was observed during the procedure. Post-procedure instructions were reviewed.  Patient left the clinic in stable condition.    Clinical History: MRI LUMBAR SPINE WITHOUT CONTRAST   TECHNIQUE: Multiplanar, multisequence MR imaging of the lumbar spine was performed. No intravenous contrast was administered.   COMPARISON:  07/30/2011   FINDINGS: Segmentation:  5 lumbar type vertebrae   Alignment:  Mild scoliosis.   Vertebrae:  No fracture, evidence of discitis, or bone lesion.   Conus medullaris and cauda equina: Conus extends to the L1-2 level. Conus and cauda equina appear normal.   Paraspinal and other soft tissues: Partially covered right renal cyst. No perispinal mass or inflammation noted.   Disc levels:   T12- L1: Unremarkable.   L1-L2: Mild disc bulging.   L2-L3: Mild disc narrowing and  bulging. Early facet spurring on the left   L3-L4: Mild disc narrowing and right eccentric bulging. Mild facet spurring.   L4-L5: Degenerative facet spurring.  Mild disc bulging.   L5-S1:Greatest level of degenerative disc narrowing which is right eccentric. Disc bulging and ridging towards the right where there is also greater facet spurring. Moderate right foraminal impingement. Asymmetric right subarticular recess narrowing that could affect the right S1 nerve root.   IMPRESSION: 1. Similar degree of lumbar spine degeneration with mild scoliosis when compared to 2018. 2. Neural impingement limited to the L5-S1 level at the right foramen and subarticular recess     Electronically Signed   By: Monte Fantasia M.D.   On: 09/28/2020 16:09     Objective:  VS:  HT:    WT:   BMI:     BP:(!) 142/85  HR:(!) 102bpm  TEMP: ( )  RESP:  Physical Exam Vitals and nursing note reviewed.  Constitutional:      General: She is not in acute distress.    Appearance: Normal appearance. She is not ill-appearing.  HENT:     Head: Normocephalic and atraumatic.     Right Ear: External ear normal.  Left Ear: External ear normal.  Eyes:     Extraocular Movements: Extraocular movements intact.  Cardiovascular:     Rate and Rhythm: Normal rate.     Pulses: Normal pulses.  Pulmonary:     Effort: Pulmonary effort is normal. No respiratory distress.  Abdominal:     General: There is no distension.     Palpations: Abdomen is soft.  Musculoskeletal:        General: Tenderness present.     Cervical back: Neck supple.     Right lower leg: No edema.     Left lower leg: No edema.     Comments: Patient has good distal strength with no pain over the greater trochanters.  No clonus or focal weakness.  Skin:    Findings: No erythema, lesion or rash.  Neurological:     General: No focal deficit present.     Mental Status: She is alert and oriented to person, place, and time.     Sensory: No  sensory deficit.     Motor: No weakness or abnormal muscle tone.     Coordination: Coordination normal.  Psychiatric:        Mood and Affect: Mood normal.        Behavior: Behavior normal.     Imaging: XR C-ARM NO REPORT  Result Date: 12/19/2020 Please see Notes tab for imaging impression.

## 2020-12-20 NOTE — Procedures (Signed)
Lumbar Epidural Steroid Injection - Interlaminar Approach with Fluoroscopic Guidance  Patient: Natalie Campos      Date of Birth: [birth year] MRN: [id number] PCP: Kathyrn Drown, MD      Visit Date: 12/19/2020   Universal Protocol:     Consent Given By: the patient  Position: PRONE  Additional Comments: Vital signs were monitored before and after the procedure. Patient was prepped and draped in the usual sterile fashion. The correct patient, procedure, and site was verified.   Injection Procedure Details:   Procedure diagnoses: Lumbar radiculopathy [M54.16]   Meds Administered:  Meds ordered this encounter  Medications   betamethasone acetate-betamethasone sodium phosphate (CELESTONE) injection 12 mg     Laterality: Right  Location/Site:  L5-S1  Needle: 3.5 in., 20 ga. Tuohy, 4.5 in would be better choice in future  Needle Placement: Paramedian epidural  Findings:   -Comments: Excellent flow of contrast into the epidural space.  Procedure Details: Using a paramedian approach from the side mentioned above, the region overlying the inferior lamina was localized under fluoroscopic visualization and the soft tissues overlying this structure were infiltrated with 4 ml. of 1% Lidocaine without Epinephrine. The Tuohy needle was inserted into the epidural space using a paramedian approach.   The epidural space was localized using loss of resistance along with counter oblique bi-planar fluoroscopic views.  After negative aspirate for air, blood, and CSF, a 2 ml. volume of Isovue-250 was injected into the epidural space and the flow of contrast was observed. Radiographs were obtained for documentation purposes.    The injectate was administered into the level noted above.   Additional Comments:  The patient tolerated the procedure well Dressing: 2 x 2 sterile gauze and Band-Aid    Post-procedure details: Patient was observed during the procedure. Post-procedure  instructions were reviewed.  Patient left the clinic in stable condition.

## 2021-01-02 ENCOUNTER — Telehealth: Payer: Self-pay | Admitting: Pharmacy Technician

## 2021-01-02 NOTE — Telephone Encounter (Signed)
(  FYI)  [facility] PATIENT  Currently enrolled in [facility]. (FREE DRUG) ID#  [id number] [employer] # [phone] opt#[id number]

## 2021-01-14 ENCOUNTER — Encounter (INDEPENDENT_AMBULATORY_CARE_PROVIDER_SITE_OTHER): Payer: Self-pay | Admitting: Internal Medicine

## 2021-01-14 ENCOUNTER — Ambulatory Visit (INDEPENDENT_AMBULATORY_CARE_PROVIDER_SITE_OTHER): Payer: Medicare Other | Admitting: Internal Medicine

## 2021-01-14 ENCOUNTER — Other Ambulatory Visit: Payer: Self-pay

## 2021-01-14 VITALS — BP 133/71 | HR 103 | Temp 99.0°F | Ht 69.0 in | Wt 231.7 lb

## 2021-01-14 DIAGNOSIS — K582 Mixed irritable bowel syndrome: Secondary | ICD-10-CM | POA: Diagnosis not present

## 2021-01-14 DIAGNOSIS — K219 Gastro-esophageal reflux disease without esophagitis: Secondary | ICD-10-CM

## 2021-01-14 DIAGNOSIS — K2270 Barrett's esophagus without dysplasia: Secondary | ICD-10-CM | POA: Diagnosis not present

## 2021-01-14 MED ORDER — OMEPRAZOLE 40 MG PO CPDR: 40.0000 mg | DELAYED_RELEASE_CAPSULE | Freq: Every day | ORAL | 3 refills | Status: DC

## 2021-01-14 NOTE — Patient Instructions (Signed)
Esophagogastroduodenoscopy to be scheduled near future. Try using Dulcolax suppository on days when you do not have a bowel movement as it might prevent you from having explosive bowel movements.

## 2021-01-14 NOTE — Progress Notes (Signed)
Presenting complaint;  Follow for chronic GERD and IBS.  Database and subjective:  Patient is 60 year old Caucasian female who is here for scheduled visit.  She was last seen on 07/09/2020.  She has chronic GERD complicated by short segment Barrett's esophagus.  Last EGD was in April 2015.  She also has a history of IBS with irregular bowel movements. Last screening colonoscopy was in April 2015 revealing small anal papillae and external hemorrhoids.  She states she is doing well from GI standpoint.  She rarely has heartburn.  She feels present therapy is working well.  She denies dysphagia nausea or vomiting.  She also denies chronic cough sore throat or hoarseness.  She remains with irregular bowel movements.  She would skip 2 or 3 days and then she has an explosive bowel movement.  However she denies melena or rectal bleeding.  Her appetite is fair.  She has lost 3 pounds since her last visit. She remains with back pain as well as left knee pain which is affecting quality of her life.  She says she had epidural to her back last month but so far cannot tell a difference.  She also complains of severe left knee pain.  She is felt to have severe knee osteoarthrosis were felt to be an increased risk because of underlying MS. She feels her MS is in remission while on Ocrevus.  Current Medications: Outpatient Encounter Medications as of 01/14/2021  Medication Sig   amLODipine (NORVASC) 5 MG tablet TAKE 1 TABLET BY MOUTH  DAILY   amphetamine-dextroamphetamine (ADDERALL) 10 MG tablet dextroamphetamine-amphetamine 10 mg tablet   baclofen (LIORESAL) 10 MG tablet Take 10 mg by mouth 2 (two) times daily.    Cholecalciferol (VITAMIN D3) 3000 UNITS TABS Take 1 tablet by mouth daily. 50,000 u weekly   diazepam (VALIUM) 10 MG tablet Take 10 mg by mouth 3 (three) times daily as needed. One tablet by mouth 4 times daily   docusate sodium (COLACE) 100 MG capsule Take 100 mg by mouth daily. One caplet by mouth  Two  times a day   DULoxetine (CYMBALTA) 30 MG capsule Take 60 mg by mouth daily.    famotidine (PEPCID) 20 MG tablet TAKE 1 TABLET BY MOUTH AT  BEDTIME   ferrous sulfate (FERROUSUL) 325 (65 FE) MG tablet Take 1 tablet (325 mg total) by mouth daily with breakfast.   fish oil-omega-3 fatty acids 1000 MG capsule Take by mouth daily. One cap by mouth daily   gabapentin (NEURONTIN) 600 MG tablet Take 600 mg by mouth 3 (three) times daily.    HYDROmorphone (DILAUDID) 4 MG tablet Take 4 mg by mouth every 12 (twelve) hours as needed for severe pain.   losartan (COZAAR) 100 MG tablet TAKE 1 TABLET BY MOUTH  DAILY   modafinil (PROVIGIL) 200 MG tablet 200 mg 2 (two) times daily.    multivitamin (THERAGRAN) per tablet Take 1 tablet by mouth daily. One tablet by mouth once daily   Ocrelizumab (OCREVUS IV) Inject into the vein. Infused every 6 months.   omeprazole (PRILOSEC) 40 MG capsule Take 1 capsule (40 mg total) by mouth daily.   polyethylene glycol powder (GLYCOLAX/MIRALAX) 17 GM/SCOOP powder MIX 25.5 GRAMS WITH WATER OR JUICE ONCE DAILY.   potassium chloride (K-DUR) 10 MEQ tablet Take 1 tablet (10 mEq total) by mouth 2 (two) times daily.   topiramate (TOPAMAX) 100 MG tablet Take 100 mg by mouth daily.   traMADol (ULTRAM) 50 MG tablet Take 100 mg by  mouth 2 (two) times daily.    vitamin E 1000 UNIT capsule Take 1,000 Units by mouth daily.   Facility-Administered Encounter Medications as of 01/14/2021  Medication   Influenza (>/= 3 years) inactive virus vaccine (FLVIRIN/FLUZONE) injection SUSP 0.5 mL     Objective: Blood pressure 133/71, pulse (!) 103, temperature 99 F (37.2 C), temperature source Oral, height 5' 9" (1.753 m), weight 231 lb 11.2 oz (105.1 kg). Patient is alert and in no acute distress. Conjunctiva is pink. Sclera is nonicteric Oropharyngeal mucosa is normal. Dentition in satisfactory condition. No neck masses or thyromegaly noted. Cardiac exam with regular rhythm normal S1 and S2.  No murmur or gallop noted. Lungs are clear to auscultation. Abdomen is full.  On palpation is soft.  She has mild tenderness in left upper quadrant without guarding.  No organomegaly or masses. No LE edema or clubbing noted.  Labs/studies Results:   CBC Latest Ref Rng & Units 03/04/2020 03/25/2018 08/11/2017  WBC 3.4 - 10.8 x10E3/uL 8.5 5.1 4.7  Hemoglobin 11.1 - 15.9 g/dL 14.9 14.4 14.2  Hematocrit 34.0 - 46.6 % 43.7 42.4 41.9  Platelets 150 - 450 x10E3/uL 313 225 237    CMP Latest Ref Rng & Units 03/04/2020 06/28/2018 05/02/2018  Glucose 65 - 99 mg/dL 96 - 104(H)  BUN 8 - 27 mg/dL 15 - 10  Creatinine 0.57 - 1.00 mg/dL 0.93 - 0.88  Sodium 134 - 144 mmol/L 141 - 143  Potassium 3.5 - 5.2 mmol/L 4.0 - 3.6  Chloride 96 - 106 mmol/L 104 - 107(H)  CO2 20 - 29 mmol/L 20 - 21  Calcium 8.7 - 10.3 mg/dL 9.6 - 9.4  Total Protein 6.0 - 8.5 g/dL 7.6 7.4 -  Total Bilirubin 0.0 - 1.2 mg/dL <0.2 0.3 -  Alkaline Phos 44 - 121 IU/L 114 140(H) -  AST 0 - 40 IU/L 17 17 -  ALT 0 - 32 IU/L 14 14 -    Hepatic Function Latest Ref Rng & Units 03/04/2020 06/28/2018 03/25/2018  Total Protein 6.0 - 8.5 g/dL 7.6 7.4 7.2  Albumin 3.8 - 4.8 g/dL 4.8 4.6 3.9  AST 0 - 40 IU/L _0 ALT 0 - 32 IU/L _1 Alk Phosphatase 44 - 121 IU/L 114 140(H) 110  Total Bilirubin 0.0 - 1.2 mg/dL <0.2 0.3 0.8  Bilirubin, Direct 0.00 - 0.40 mg/dL - 0.09 -    Lab data from 03/04/2020 reviewed Normal CBC and comprehensive chemistry panel.  Assessment:  #1.  Chronic GERD complicated by short segment Barrett's esophagus.  Symptoms are well controlled with combination of single dose PPI and low-dose H2B.  Last EGD was in April 2015.  Therefore she has long overdue for surveillance EGD.  #2.  IBS.  She has both diarrhea and constipation.  Trial with Dulcolax suppositories would be worthwhile as it may prevent her from having explosive bowel movements.  #3.  Patient is average risk for CRC.  Last colonoscopy was in April  2015 and next one would be due in April 2025.   Plan:  New prescription sent for omeprazole 40 mg p.o. every morning to [facility] for 90 days with 3 refills. Surveillance of esophagogastroduodenoscopy in near future under monitored anesthesia care. Patient will try Dulcolax suppositories on days when she has not had a spontaneous bowel movement and has an urge.  She will try this for few weeks and see if it helps her regular her bowel habits otherwise she can  stop using it. Office visit in 6 months.

## 2021-01-20 ENCOUNTER — Other Ambulatory Visit (INDEPENDENT_AMBULATORY_CARE_PROVIDER_SITE_OTHER): Payer: Self-pay

## 2021-01-20 DIAGNOSIS — K582 Mixed irritable bowel syndrome: Secondary | ICD-10-CM

## 2021-01-20 DIAGNOSIS — K2270 Barrett's esophagus without dysplasia: Secondary | ICD-10-CM

## 2021-01-20 DIAGNOSIS — K219 Gastro-esophageal reflux disease without esophagitis: Secondary | ICD-10-CM

## 2021-01-21 ENCOUNTER — Encounter (INDEPENDENT_AMBULATORY_CARE_PROVIDER_SITE_OTHER): Payer: Self-pay

## 2021-01-21 ENCOUNTER — Other Ambulatory Visit (INDEPENDENT_AMBULATORY_CARE_PROVIDER_SITE_OTHER): Payer: Self-pay

## 2021-01-27 ENCOUNTER — Other Ambulatory Visit: Payer: Self-pay | Admitting: Nurse Practitioner

## 2021-01-27 ENCOUNTER — Telehealth: Payer: Self-pay | Admitting: Physical Medicine and Rehabilitation

## 2021-01-27 ENCOUNTER — Telehealth: Payer: Self-pay | Admitting: Family Medicine

## 2021-01-27 DIAGNOSIS — M5416 Radiculopathy, lumbar region: Secondary | ICD-10-CM

## 2021-01-27 DIAGNOSIS — Z79899 Other long term (current) drug therapy: Secondary | ICD-10-CM | POA: Diagnosis not present

## 2021-01-27 DIAGNOSIS — D509 Iron deficiency anemia, unspecified: Secondary | ICD-10-CM | POA: Diagnosis not present

## 2021-01-27 DIAGNOSIS — I10 Essential (primary) hypertension: Secondary | ICD-10-CM | POA: Diagnosis not present

## 2021-01-27 DIAGNOSIS — E785 Hyperlipidemia, unspecified: Secondary | ICD-10-CM | POA: Diagnosis not present

## 2021-01-27 MED ORDER — FLUCONAZOLE 150 MG PO TABS: ORAL_TABLET | ORAL | 0 refills | Status: DC

## 2021-01-27 NOTE — Telephone Encounter (Signed)
Done

## 2021-01-27 NOTE — Telephone Encounter (Signed)
Patient is requesting something for yeast infection from antibiotic for a bad tooth. [facility]

## 2021-01-27 NOTE — Telephone Encounter (Signed)
Right L5-S1 IL on 8/11. Please advise.

## 2021-01-27 NOTE — Telephone Encounter (Signed)
Pt states she is still in lots of pain post inj and was told top call back if she did not feel any relief. The  best call back number for the pt is [phone].

## 2021-01-28 LAB — CBC WITH DIFFERENTIAL/PLATELET
Basophils Absolute: 0.1 10*3/uL (ref 0.0–0.2)
Basos: 1 %
EOS (ABSOLUTE): 0.5 10*3/uL — ABNORMAL HIGH (ref 0.0–0.4)
Eos: 8 %
Hematocrit: 46.1 % (ref 34.0–46.6)
Hemoglobin: 15.8 g/dL (ref 11.1–15.9)
Immature Grans (Abs): 0 10*3/uL (ref 0.0–0.1)
Immature Granulocytes: 0 %
Lymphocytes Absolute: 1.3 10*3/uL (ref 0.7–3.1)
Lymphs: 19 %
MCH: 31.5 pg (ref 26.6–33.0)
MCHC: 34.3 g/dL (ref 31.5–35.7)
MCV: 92 fL (ref 79–97)
Monocytes Absolute: 0.4 10*3/uL (ref 0.1–0.9)
Monocytes: 7 %
Neutrophils Absolute: 4.4 10*3/uL (ref 1.4–7.0)
Neutrophils: 65 %
Platelets: 275 10*3/uL (ref 150–450)
RBC: 5.02 x10E6/uL (ref 3.77–5.28)
RDW: 12.2 % (ref 11.7–15.4)
WBC: 6.7 10*3/uL (ref 3.4–10.8)

## 2021-01-28 LAB — BASIC METABOLIC PANEL
BUN/Creatinine Ratio: 12 (ref 12–28)
BUN: 11 mg/dL (ref 8–27)
CO2: 19 mmol/L — ABNORMAL LOW (ref 20–29)
Calcium: 9.7 mg/dL (ref 8.7–10.3)
Chloride: 106 mmol/L (ref 96–106)
Creatinine, Ser: 0.9 mg/dL (ref 0.57–1.00)
Glucose: 104 mg/dL — ABNORMAL HIGH (ref 65–99)
Potassium: 4 mmol/L (ref 3.5–5.2)
Sodium: 141 mmol/L (ref 134–144)
eGFR: 73 mL/min/{1.73_m2} (ref >59)

## 2021-01-28 LAB — LIPID PANEL
Chol/HDL Ratio: 3.8 ratio (ref 0.0–4.4)
Cholesterol, Total: 224 mg/dL — ABNORMAL HIGH (ref 100–199)
HDL: 59 mg/dL (ref >39)
LDL Chol Calc (NIH): 131 mg/dL — ABNORMAL HIGH (ref 0–99)
Triglycerides: 193 mg/dL — ABNORMAL HIGH (ref 0–149)
VLDL Cholesterol Cal: 34 mg/dL (ref 5–40)

## 2021-01-28 LAB — FERRITIN: Ferritin: 48 ng/mL (ref 15–150)

## 2021-01-28 NOTE — Telephone Encounter (Signed)
Patient states she had no relief even for a short time. Scheduled for S1 TF.

## 2021-02-03 DIAGNOSIS — K08409 Partial loss of teeth, unspecified cause, unspecified class: Secondary | ICD-10-CM | POA: Diagnosis not present

## 2021-02-03 DIAGNOSIS — H9209 Otalgia, unspecified ear: Secondary | ICD-10-CM | POA: Diagnosis not present

## 2021-02-03 DIAGNOSIS — R509 Fever, unspecified: Secondary | ICD-10-CM | POA: Diagnosis not present

## 2021-02-03 DIAGNOSIS — Z87891 Personal history of nicotine dependence: Secondary | ICD-10-CM | POA: Diagnosis not present

## 2021-02-03 DIAGNOSIS — T50905A Adverse effect of unspecified drugs, medicaments and biological substances, initial encounter: Secondary | ICD-10-CM | POA: Diagnosis not present

## 2021-02-03 DIAGNOSIS — M542 Cervicalgia: Secondary | ICD-10-CM | POA: Diagnosis not present

## 2021-02-03 DIAGNOSIS — T8140XA Infection following a procedure, unspecified, initial encounter: Secondary | ICD-10-CM | POA: Diagnosis not present

## 2021-02-03 DIAGNOSIS — Z20822 Contact with and (suspected) exposure to covid-19: Secondary | ICD-10-CM | POA: Diagnosis not present

## 2021-02-03 DIAGNOSIS — M47812 Spondylosis without myelopathy or radiculopathy, cervical region: Secondary | ICD-10-CM | POA: Diagnosis not present

## 2021-02-03 DIAGNOSIS — R220 Localized swelling, mass and lump, head: Secondary | ICD-10-CM | POA: Diagnosis not present

## 2021-02-03 DIAGNOSIS — R6884 Jaw pain: Secondary | ICD-10-CM | POA: Diagnosis not present

## 2021-02-03 DIAGNOSIS — M2669 Other specified disorders of temporomandibular joint: Secondary | ICD-10-CM | POA: Diagnosis not present

## 2021-02-03 DIAGNOSIS — T783XXA Angioneurotic edema, initial encounter: Secondary | ICD-10-CM | POA: Diagnosis not present

## 2021-02-03 DIAGNOSIS — T8149XA Infection following a procedure, other surgical site, initial encounter: Secondary | ICD-10-CM | POA: Diagnosis not present

## 2021-02-03 DIAGNOSIS — K148 Other diseases of tongue: Secondary | ICD-10-CM | POA: Diagnosis not present

## 2021-02-03 DIAGNOSIS — K219 Gastro-esophageal reflux disease without esophagitis: Secondary | ICD-10-CM | POA: Diagnosis not present

## 2021-02-03 DIAGNOSIS — I10 Essential (primary) hypertension: Secondary | ICD-10-CM | POA: Diagnosis not present

## 2021-02-04 ENCOUNTER — Telehealth: Payer: Self-pay | Admitting: Physical Medicine and Rehabilitation

## 2021-02-04 NOTE — Telephone Encounter (Signed)
Left message #1

## 2021-02-04 NOTE — Telephone Encounter (Signed)
Pt called needing to r/s her 02/06/21 appt.   [phone]

## 2021-02-06 ENCOUNTER — Ambulatory Visit: Payer: Medicare Other | Admitting: Physical Medicine and Rehabilitation

## 2021-02-06 ENCOUNTER — Telehealth: Payer: Self-pay | Admitting: Physical Medicine and Rehabilitation

## 2021-02-06 NOTE — Telephone Encounter (Signed)
Patient;s husband Jenny Reichmann called advised patient need to reschedule her appointment. The number to contact patient is [phone]

## 2021-02-07 NOTE — Patient Instructions (Signed)
Natalie Campos  02/07/2021     @PREFPERIOPPHARMACY @   Your procedure is scheduled on  02/09/2021.   Report to [facility] at  [phone] A.M.   Call this number if you have problems the morning of surgery:  [phone]   Remember    Follow the diet instructions given to you by the office.    Take these medicines the morning of surgery with A SIP OF WATER      amlodipine, adderall, baclofen, valium (if needed), cymbalta, pepcid, gabapentin, dilaudid(if needed), prilosec, topamax.     Do not wear jewelry, make-up or nail polish.  Do not wear lotions, powders, or perfumes, or deodorant.  Do not shave 48 hours prior to surgery.  Men may shave face and neck.  Do not bring valuables to the hospital.  [facility] is not responsible for any belongings or valuables.  Contacts, dentures or bridgework may not be worn into surgery.  Leave your suitcase in the car.  After surgery it may be brought to your room.  For patients admitted to the hospital, discharge time will be determined by your treatment team.  Patients discharged the day of surgery will not be allowed to drive home and must have someone with them for 24 hours.    Special instructions:   DO NOT smoke tobacco or vape for 24 hours before your procedure.  Please read over the following fact sheets that you were given. Anesthesia Post-op Instructions and Care and Recovery After Surgery      Upper Endoscopy, Adult, Care After This sheet gives you information about how to care for yourself after your procedure. Your health care provider may also give you more specific instructions. If you have problems or questions, contact your health care provider. What can I expect after the procedure? After the procedure, it is common to have: A sore throat. Mild stomach pain or discomfort. Bloating. Nausea. Follow these instructions at home:  Follow instructions from your health care provider about what to eat or drink after your  procedure. Return to your normal activities as told by your health care provider. Ask your health care provider what activities are safe for you. Take over-the-counter and prescription medicines only as told by your health care provider. If you were given a sedative during the procedure, it can affect you for several hours. Do not drive or operate machinery until your health care provider says that it is safe. Keep all follow-up visits as told by your health care provider. This is important. Contact a health care provider if you have: A sore throat that lasts longer than one day. Trouble swallowing. Get help right away if: You vomit blood or your vomit looks like coffee grounds. You have: A fever. Bloody, black, or tarry stools. A severe sore throat or you cannot swallow. Difficulty breathing. Severe pain in your chest or abdomen. Summary After the procedure, it is common to have a sore throat, mild stomach discomfort, bloating, and nausea. If you were given a sedative during the procedure, it can affect you for several hours. Do not drive or operate machinery until your health care provider says that it is safe. Follow instructions from your health care provider about what to eat or drink after your procedure. Return to your normal activities as told by your health care provider. This information is not intended to replace advice given to you by your health care provider. Make sure you discuss any questions you have  with your health care provider. Document Revised: 04/25/2019 Document Reviewed: 09/27/2017 Elsevier Patient Education  2022 [place] After This sheet gives you information about how to care for yourself after your procedure. Your health care provider may also give you more specific instructions. If you have problems or questions, contact your health care provider. What can I expect after the procedure? After the procedure, it is common to  have: Tiredness. Forgetfulness about what happened after the procedure. Impaired judgment for important decisions. Nausea or vomiting. Some difficulty with balance. Follow these instructions at home: For the time period you were told by your health care provider:   Rest as needed. Do not participate in activities where you could fall or become injured. Do not drive or use machinery. Do not drink alcohol. Do not take sleeping pills or medicines that cause drowsiness. Do not make important decisions or sign legal documents. Do not take care of children on your own. Eating and drinking Follow the diet that is recommended by your health care provider. Drink enough fluid to keep your urine pale yellow. If you vomit: Drink water, juice, or soup when you can drink without vomiting. Make sure you have little or no nausea before eating solid foods. General instructions Have a responsible adult stay with you for the time you are told. It is important to have someone help care for you until you are awake and alert. Take over-the-counter and prescription medicines only as told by your health care provider. If you have sleep apnea, surgery and certain medicines can increase your risk for breathing problems. Follow instructions from your health care provider about wearing your sleep device: Anytime you are sleeping, including during daytime naps. While taking prescription pain medicines, sleeping medicines, or medicines that make you drowsy. Avoid smoking. Keep all follow-up visits as told by your health care provider. This is important. Contact a health care provider if: You keep feeling nauseous or you keep vomiting. You feel light-headed. You are still sleepy or having trouble with balance after 24 hours. You develop a rash. You have a fever. You have redness or swelling around the IV site. Get help right away if: You have trouble breathing. You have new-onset confusion at  home. Summary For several hours after your procedure, you may feel tired. You may also be forgetful and have poor judgment. Have a responsible adult stay with you for the time you are told. It is important to have someone help care for you until you are awake and alert. Rest as told. Do not drive or operate machinery. Do not drink alcohol or take sleeping pills. Get help right away if you have trouble breathing, or if you suddenly become confused. This information is not intended to replace advice given to you by your health care provider. Make sure you discuss any questions you have with your health care provider. Document Revised: 01/11/2020 Document Reviewed: 03/30/2019 Elsevier Patient Education  2022 [employer].

## 2021-02-10 ENCOUNTER — Other Ambulatory Visit: Payer: Self-pay

## 2021-02-10 ENCOUNTER — Encounter (HOSPITAL_COMMUNITY)
Admission: RE | Admit: 2021-02-10 | Discharge: 2021-02-10 | Disposition: A | Discharge status: Home / Self Care | Payer: Medicare Other | Source: Ambulatory Visit | Attending: Internal Medicine | Admitting: Internal Medicine

## 2021-02-10 ENCOUNTER — Encounter (HOSPITAL_COMMUNITY): Payer: Self-pay

## 2021-02-10 DIAGNOSIS — K2270 Barrett's esophagus without dysplasia: Secondary | ICD-10-CM

## 2021-02-10 DIAGNOSIS — K219 Gastro-esophageal reflux disease without esophagitis: Secondary | ICD-10-CM

## 2021-02-10 DIAGNOSIS — K582 Mixed irritable bowel syndrome: Secondary | ICD-10-CM

## 2021-02-10 DIAGNOSIS — Z01810 Encounter for preprocedural cardiovascular examination: Secondary | ICD-10-CM | POA: Insufficient documentation

## 2021-02-10 HISTORY — DX: Other complications of anesthesia, initial encounter: T88.59XA

## 2021-02-11 ENCOUNTER — Encounter: Payer: Self-pay | Admitting: Family Medicine

## 2021-02-11 ENCOUNTER — Ambulatory Visit (INDEPENDENT_AMBULATORY_CARE_PROVIDER_SITE_OTHER): Payer: Medicare Other | Admitting: Family Medicine

## 2021-02-11 VITALS — BP 136/76 | HR 96 | Temp 98.4°F | Ht 69.0 in | Wt 231.0 lb

## 2021-02-11 DIAGNOSIS — I10 Essential (primary) hypertension: Secondary | ICD-10-CM

## 2021-02-11 NOTE — Progress Notes (Signed)
   Subjective:    Patient ID: Natalie Campos, female    DOB: 1961/03/06, 61 y.o.   MRN: [id number]  [facility] Follow Up- patient had infection in mouth and was given multiple Abx patient recently in the hospital due to allergic reaction and infection and was put on antibiotics doing better now  Continued swelling in mouth, has been D/C from losartan for allergic reaction is currently just on Norvasc not taking any losartan  States breathing is doing fine denies any setbacks or problems   Review of Systems     Objective:   Physical Exam Oral airway looks good lungs clear heart regular HEENT benign       Assessment & Plan:  Blood pressure stable continue current measures Oral infection resolved Patient has follow-up with gastroenterology Patient to follow-up with [place] within 4 months no additional blood work needed currently

## 2021-02-12 ENCOUNTER — Encounter (HOSPITAL_COMMUNITY)
Admission: RE | Disposition: A | Discharge status: Home / Self Care | Payer: Self-pay | Source: Home / Self Care | Attending: Internal Medicine

## 2021-02-12 ENCOUNTER — Ambulatory Visit (HOSPITAL_COMMUNITY)
Admission: RE | Admit: 2021-02-12 | Discharge: 2021-02-12 | Disposition: A | Discharge status: Home / Self Care | Payer: Medicare Other | Attending: Internal Medicine | Admitting: Internal Medicine

## 2021-02-12 ENCOUNTER — Other Ambulatory Visit: Payer: Self-pay

## 2021-02-12 ENCOUNTER — Ambulatory Visit (HOSPITAL_COMMUNITY): Payer: Medicare Other | Admitting: Certified Registered"

## 2021-02-12 ENCOUNTER — Encounter (HOSPITAL_COMMUNITY): Payer: Self-pay | Admitting: Internal Medicine

## 2021-02-12 DIAGNOSIS — Z888 Allergy status to other drugs, medicaments and biological substances status: Secondary | ICD-10-CM | POA: Insufficient documentation

## 2021-02-12 DIAGNOSIS — K449 Diaphragmatic hernia without obstruction or gangrene: Secondary | ICD-10-CM | POA: Insufficient documentation

## 2021-02-12 DIAGNOSIS — Z9049 Acquired absence of other specified parts of digestive tract: Secondary | ICD-10-CM | POA: Diagnosis not present

## 2021-02-12 DIAGNOSIS — K2270 Barrett's esophagus without dysplasia: Secondary | ICD-10-CM | POA: Diagnosis not present

## 2021-02-12 DIAGNOSIS — Z8711 Personal history of peptic ulcer disease: Secondary | ICD-10-CM | POA: Insufficient documentation

## 2021-02-12 DIAGNOSIS — K219 Gastro-esophageal reflux disease without esophagitis: Secondary | ICD-10-CM | POA: Diagnosis not present

## 2021-02-12 DIAGNOSIS — Z880 Allergy status to penicillin: Secondary | ICD-10-CM | POA: Diagnosis not present

## 2021-02-12 DIAGNOSIS — K3189 Other diseases of stomach and duodenum: Secondary | ICD-10-CM | POA: Diagnosis not present

## 2021-02-12 DIAGNOSIS — Z79899 Other long term (current) drug therapy: Secondary | ICD-10-CM | POA: Diagnosis not present

## 2021-02-12 DIAGNOSIS — K589 Irritable bowel syndrome without diarrhea: Secondary | ICD-10-CM | POA: Diagnosis not present

## 2021-02-12 DIAGNOSIS — K582 Mixed irritable bowel syndrome: Secondary | ICD-10-CM

## 2021-02-12 DIAGNOSIS — J439 Emphysema, unspecified: Secondary | ICD-10-CM | POA: Diagnosis not present

## 2021-02-12 DIAGNOSIS — Z87891 Personal history of nicotine dependence: Secondary | ICD-10-CM | POA: Insufficient documentation

## 2021-02-12 HISTORY — PX: BIOPSY: SHX5522

## 2021-02-12 HISTORY — PX: ESOPHAGOGASTRODUODENOSCOPY (EGD) WITH PROPOFOL: SHX5813

## 2021-02-12 SURGERY — ESOPHAGOGASTRODUODENOSCOPY (EGD) WITH PROPOFOL
Anesthesia: General

## 2021-02-12 MED ORDER — LACTATED RINGERS IV SOLN
INTRAVENOUS | Status: DC
  Administered 2021-02-12: 1000 mL via INTRAVENOUS

## 2021-02-12 MED ORDER — CHLORHEXIDINE GLUCONATE CLOTH 2 % EX PADS: 6.0000 | MEDICATED_PAD | Freq: Once | CUTANEOUS | Status: DC

## 2021-02-12 MED ORDER — PHENYLEPHRINE 40 MCG/ML (10ML) SYRINGE FOR IV PUSH (FOR BLOOD PRESSURE SUPPORT)
PREFILLED_SYRINGE | INTRAVENOUS | Status: AC
  Filled 2021-02-12: qty 10

## 2021-02-12 MED ORDER — LIDOCAINE HCL (CARDIAC) PF 100 MG/5ML IV SOSY
PREFILLED_SYRINGE | INTRAVENOUS | Status: DC | PRN
  Administered 2021-02-12: 50 mg via INTRAVENOUS

## 2021-02-12 MED ORDER — DEXMEDETOMIDINE (PRECEDEX) IN NS 20 MCG/5ML (4 MCG/ML) IV SYRINGE
PREFILLED_SYRINGE | INTRAVENOUS | Status: AC
  Filled 2021-02-12: qty 5

## 2021-02-12 MED ORDER — STERILE WATER FOR IRRIGATION IR SOLN
Status: DC | PRN
  Administered 2021-02-12: 100 mL

## 2021-02-12 MED ORDER — DEXMEDETOMIDINE (PRECEDEX) IN NS 20 MCG/5ML (4 MCG/ML) IV SYRINGE
PREFILLED_SYRINGE | INTRAVENOUS | Status: DC | PRN
  Administered 2021-02-12 (×2): 10 ug via INTRAVENOUS

## 2021-02-12 MED ORDER — PHENYLEPHRINE 40 MCG/ML (10ML) SYRINGE FOR IV PUSH (FOR BLOOD PRESSURE SUPPORT)
PREFILLED_SYRINGE | INTRAVENOUS | Status: DC | PRN
  Administered 2021-02-12: 80 ug via INTRAVENOUS

## 2021-02-12 MED ORDER — PROPOFOL 10 MG/ML IV BOLUS
INTRAVENOUS | Status: DC | PRN
  Administered 2021-02-12: 40 mg via INTRAVENOUS
  Administered 2021-02-12: 50 mg via INTRAVENOUS
  Administered 2021-02-12: 40 mg via INTRAVENOUS
  Administered 2021-02-12: 120 mg via INTRAVENOUS

## 2021-02-12 NOTE — Discharge Instructions (Signed)
No aspirin or NSAIDs for 24 hours Resume usual medications and diet as before. No driving for 24 hours. Physician will call with biopsy results. Will schedule upper GI series to reassess hernia with GI study.

## 2021-02-12 NOTE — Anesthesia Procedure Notes (Signed)
Date/Time: 02/12/2021 7:37 AM Performed by: Orlie Dakin, CRNA Pre-anesthesia Checklist: Patient identified, Emergency Drugs available, Suction available and Patient being monitored Patient Re-evaluated:Patient Re-evaluated prior to induction Oxygen Delivery Method: Nasal cannula Induction Type: IV induction Placement Confirmation: positive ETCO2

## 2021-02-12 NOTE — Anesthesia Postprocedure Evaluation (Signed)
Anesthesia Post Note  Patient: NEOMI LAIDLER  Procedure(s) Performed: ESOPHAGOGASTRODUODENOSCOPY (EGD) WITH PROPOFOL BIOPSY  Patient location during evaluation: Phase II Anesthesia Type: General Level of consciousness: awake Pain management: pain level controlled Vital Signs Assessment: post-procedure vital signs reviewed and stable Respiratory status: spontaneous breathing and respiratory function stable Cardiovascular status: blood pressure returned to baseline and stable Postop Assessment: no headache and no apparent nausea or vomiting Anesthetic complications: no Comments: Late entry   No notable events documented.   Last Vitals:  Vitals:   02/12/21 0755 02/12/21 0811  BP:  (!) 119/58  Pulse: 74   Resp: 18   Temp: 36.8 C   SpO2: 95%     Last Pain:  Vitals:   02/12/21 0755  TempSrc: Oral  PainSc: 0-No pain                 Louann Sjogren

## 2021-02-12 NOTE — Anesthesia Preprocedure Evaluation (Addendum)
Anesthesia Evaluation  Patient identified by MRN, date of birth, ID band Patient awake    Reviewed: Allergy & Precautions, H&P , NPO status , Patient's Chart, lab work & pertinent test results, reviewed documented beta blocker date and time   History of Anesthesia Complications (+) Emergence Delirium and history of anesthetic complications  Airway Mallampati: II  TM Distance: >3 FB Neck ROM: full    Dental no notable dental hx. (+) Caps, Dental Advisory Given   Pulmonary neg pulmonary ROS, COPD, former smoker,    Pulmonary exam normal breath sounds clear to auscultation       Cardiovascular Exercise Tolerance: Good hypertension, negative cardio ROS   Rhythm:regular Rate:Normal     Neuro/Psych  Headaches, PSYCHIATRIC DISORDERS Depression negative neurological ROS  negative psych ROS   GI/Hepatic negative GI ROS, Neg liver ROS,   Endo/Other  negative endocrine ROS  Renal/GU negative Renal ROS  negative genitourinary   Musculoskeletal   Abdominal   Peds  Hematology negative hematology ROS (+)   Anesthesia Other Findings   Reproductive/Obstetrics negative OB ROS                            Anesthesia Physical Anesthesia Plan  ASA: 3  Anesthesia Plan: General   Post-op Pain Management:    Induction:   PONV Risk Score and Plan: Propofol infusion  Airway Management Planned:   Additional Equipment:   Intra-op Plan:   Post-operative Plan:   Informed Consent: I have reviewed the patients History and Physical, chart, labs and discussed the procedure including the risks, benefits and alternatives for the proposed anesthesia with the patient or authorized representative who has indicated his/her understanding and acceptance.     Dental Advisory Given  Plan Discussed with: CRNA  Anesthesia Plan Comments:         Anesthesia Quick Evaluation

## 2021-02-12 NOTE — H&P (Signed)
Natalie Campos is an 60 y.o. female.   Chief Complaint: Patient is here for esophagogastroduodenoscopy. HPI: Patient is 60 year old Caucasian female with multiple medical problems including chronic GERD complicated by short segment Barrett's esophagus who remains on high-dose PPI who is here for surveillance examination.  She was seen in the office about 4 weeks ago.  She says she had a period of 2 weeks where she had frequent heartburn or regurgitation and use OTC medications and symptoms have settled.  She denies nausea vomiting dysphagia or melena.  Her appetite is normal and her weight has been stable.  Last EGD with biopsy was in April 2015.  She was also noted to have moderate size sliding hiatal hernia. She does not take aspirin or anticoagulants.  Past Medical History:  Diagnosis Date   Anemia    Barrett's esophagus    Bulging discs    Complication of anesthesia    Complication of anesthesia    patietn wakes up easily- has anxiety due to waking up during a simple procedure   Depression    DJD (degenerative joint disease)    Dog bite(E906.0) 12/06/2009   Eczema    GERD (gastroesophageal reflux disease)    High triglycerides    Hyperlipidemia    Hypertension    IBS (irritable bowel syndrome)    contipation predominant    Iron deficiency anemia    Multiple sclerosis ([place]) 05/11/1988   Dx in 1990 most recent hospitalization for a flareis in 2011    Obesity     Past Surgical History:  Procedure Laterality Date   BIOPSY  01/28/2011   Procedure: BIOPSY;  Surgeon: Rogene Houston, MD;  Location: [facility];  Service: Endoscopy;  Laterality: N/A;   BIOPSY N/A 08/25/2013   Procedure: BIOPSY;  Surgeon: Rogene Houston, MD;  Location: [facility] OR[facility];  Service: Endoscopy;  Laterality: N/A;   CHOLECYSTECTOMY  2010   Dr. Anthony Sar    COLONOSCOPY WITH PROPOFOL N/A 08/25/2013   Procedure: COLONOSCOPY WITH PROPOFOL;  Surgeon: Rogene Houston, MD;  Location: [facility];  Service: Endoscopy;  Laterality:  N/A;  in cecum at 0811 out at 0822 = 11 minutes   ESOPHAGOGASTRODUODENOSCOPY  01/28/2011   Procedure: ESOPHAGOGASTRODUODENOSCOPY (EGD);  Surgeon: Rogene Houston, MD;  Location: [facility];  Service: Endoscopy;  Laterality: N/A;  1:00   ESOPHAGOGASTRODUODENOSCOPY (EGD) WITH PROPOFOL N/A 08/25/2013   Procedure: ESOPHAGOGASTRODUODENOSCOPY (EGD) WITH PROPOFOL;  Surgeon: Rogene Houston, MD;  Location: [facility];  Service: Endoscopy;  Laterality: N/A;   WISDOM TOOTH EXTRACTION      History reviewed. No pertinent family history. Social History:  reports that she quit smoking about 12 years ago. Her smoking use included cigarettes. She has a 45.00 pack-year smoking history. She has never used smokeless tobacco. She reports that she does not drink alcohol and does not use drugs.  Allergies:  Allergies  Allergen Reactions   Ace Inhibitors Anaphylaxis   Atenolol Anaphylaxis   Losartan Swelling    Severe tongue swelling   Penicillins     Childhood Allergy - caused coma  Has patient had a PCN reaction causing immediate rash, facial/tongue/throat swelling, SOB or lightheadedness with hypotension: No Has patient had a PCN reaction causing severe rash involving mucus membranes or skin necrosis: No Has patient had a PCN reaction that required hospitalization: No Has patient had a PCN reaction occurring within the last 10 years: No If all of the above answers are "NO", then may proceed with Cephalosporin  use.     Facility-Administered Medications Prior to Admission  Medication Dose Route Frequency Provider Last Rate Last Admin   Influenza (>/= 3 years) inactive virus vaccine (FLVIRIN/FLUZONE) injection SUSP 0.5 mL  0.5 mL Intramuscular Once [place], Modena Nunnery, MD       Medications Prior to Admission  Medication Sig Dispense Refill   amLODipine (NORVASC) 5 MG tablet TAKE 1 TABLET BY MOUTH  DAILY 90 tablet 1   amphetamine-dextroamphetamine (ADDERALL) 10 MG tablet Take 10 mg by mouth daily.      baclofen (LIORESAL) 10 MG tablet Take 10 mg by mouth 2 (two) times daily.      diazepam (VALIUM) 10 MG tablet Take 10 mg by mouth in the morning, at noon, and at bedtime. May take a 4th dose if needed     docusate sodium (COLACE) 100 MG capsule Take 100 mg by mouth 2 (two) times daily.     DULoxetine (CYMBALTA) 60 MG capsule Take 60 mg by mouth daily.      famotidine (PEPCID) 20 MG tablet TAKE 1 TABLET BY MOUTH AT  BEDTIME 90 tablet 3   ferrous sulfate (FERROUSUL) 325 (65 FE) MG tablet Take 1 tablet (325 mg total) by mouth daily with breakfast.  3   fish oil-omega-3 fatty acids 1000 MG capsule Take 1 g by mouth daily.     gabapentin (NEURONTIN) 600 MG tablet Take 600 mg by mouth 3 (three) times daily.      HYDROmorphone (DILAUDID) 4 MG tablet Take 4 mg by mouth every 12 (twelve) hours as needed for severe pain.     multivitamin (THERAGRAN) per tablet Take 1 tablet by mouth daily. One tablet by mouth once daily     Ocrelizumab (OCREVUS IV) Inject into the vein. Infused every 6 months.     omeprazole (PRILOSEC) 40 MG capsule Take 1 capsule (40 mg total) by mouth daily before supper. 90 capsule 3   oxybutynin (DITROPAN) 5 MG tablet Take 5 mg by mouth 3 (three) times daily.     polyethylene glycol powder (GLYCOLAX/MIRALAX) 17 GM/SCOOP powder MIX 25.5 GRAMS WITH WATER OR JUICE ONCE DAILY. (Patient taking differently: Take 22.5 g by mouth daily.) 510 g 11   potassium chloride (K-DUR) 10 MEQ tablet Take 1 tablet (10 mEq total) by mouth 2 (two) times daily. 60 tablet 6   topiramate (TOPAMAX) 100 MG tablet Take 100 mg by mouth daily.     traMADol (ULTRAM) 50 MG tablet Take 100 mg by mouth 2 (two) times daily.      Vitamin D, Ergocalciferol, (DRISDOL) 1.25 MG (50000 UNIT) CAPS capsule Take 50,000 Units by mouth every Monday.     vitamin E 1000 UNIT capsule Take 1,000 Units by mouth daily.     fluconazole (DIFLUCAN) 150 MG tablet One po qd prn yeast infection; may repeat in 3-4 days if needed (Patient not  taking: Reported on 02/11/2021) 2 tablet 0    No results found for this or any previous visit (from the past 48 hour(s)). No results found.  Review of Systems  Blood pressure 139/83, pulse 80, temperature 98.4 F (36.9 C), temperature source Oral, resp. rate 18, SpO2 99 %. Physical Exam HENT:     Mouth/Throat:     Mouth: Mucous membranes are moist.     Pharynx: Oropharynx is clear.  Eyes:     General: No scleral icterus.    Conjunctiva/sclera: Conjunctivae normal.  Cardiovascular:     Rate and Rhythm: Normal rate and regular rhythm.  Heart sounds: Normal heart sounds. No murmur heard. Pulmonary:     Effort: Pulmonary effort is normal.     Breath sounds: Normal breath sounds.  Abdominal:     General: There is no distension.     Palpations: Abdomen is soft. There is no mass.     Tenderness: There is no abdominal tenderness.  Musculoskeletal:        General: No swelling.     Cervical back: Neck supple.  Lymphadenopathy:     Cervical: No cervical adenopathy.  Skin:    General: Skin is warm and dry.  Neurological:     Mental Status: She is alert.     Assessment/Plan  Chronic GERD complicated by short segment Barrett's esophagus. Surveillance EGD under monitored anesthesia care.  Hildred Laser, MD 02/12/2021, 7:20 AM

## 2021-02-12 NOTE — Op Note (Signed)
[facility] Patient Name: Natalie Campos Procedure Date: 02/12/2021 7:23 AM MRN: [id number] Date of Birth: 1960-10-20 Attending MD: Hildred Laser , MD CSN: [id number] Age: 60 Admit Type: Outpatient Procedure:                Upper GI endoscopy Indications:              Gastro-esophageal reflux disease, Follow-up of                            gastro-esophageal reflux disease, Barrett's                            esophagus, Follow-up of Barrett's esophagus Providers:                Hildred Laser, MD, Lambert Mody, Nelma Rothman,                            Technician Referring MD:             Elayne Snare. [place], MD Medicines:                Propofol per Anesthesia Complications:            No immediate complications. Estimated Blood Loss:     Estimated blood loss was minimal. Procedure:                Pre-Anesthesia Assessment:                           - Prior to the procedure, a History and Physical                            was performed, and patient medications and                            allergies were reviewed. The patient's tolerance of                            previous anesthesia was also reviewed. The risks                            and benefits of the procedure and the sedation                            options and risks were discussed with the patient.                            All questions were answered, and informed consent                            was obtained. Prior Anticoagulants: The patient has                            taken no previous anticoagulant or antiplatelet                            agents.  ASA Grade Assessment: III - A patient with                            severe systemic disease. After reviewing the risks                            and benefits, the patient was deemed in                            satisfactory condition to undergo the procedure.                           After obtaining informed consent, the endoscope was                             passed under direct vision. Throughout the                            procedure, the patient's blood pressure, pulse, and                            oxygen saturations were monitored continuously. The                            [id number]-[id number] ([id number]) scope was introduced through the                            mouth, and advanced to the second part of duodenum.                            The upper GI endoscopy was accomplished without                            difficulty. The patient tolerated the procedure                            well. Scope In: 7:36:31 AM Scope Out: 7:45:01 AM Total Procedure Duration: 0 hours 8 minutes 30 seconds  Findings:      The hypopharynx was normal.      The proximal esophagus, mid esophagus and distal esophagus were normal.      There were esophageal mucosal changes suspicious for short-segment       Barrett's esophagus present in the distal esophagus. The maximum       longitudinal extent of these mucosal changes was 5 cm in length. This       was biopsied with a cold forceps for histology. The pathology specimen       was placed into Bottle Number 1.      The Z-line was found 34 cm from the incisors.      A 6 cm hiatal hernia was present.      A healed ulcer was found in the gastric fundus.      The exam of the stomach was otherwise normal.      The duodenal bulb and second portion of the duodenum were normal. Impression:               -  Normal hypopharynx.                           - Normal proximal esophagus, mid esophagus and                            distal esophagus.                           - Esophageal mucosal changes suspicious for                            short-segment Barrett's esophagus. Biopsied.                           - Z-line, 34 cm from the incisors.                           - 6 cm hiatal hernia.                           - Scar in the gastric fundus.                           - Normal duodenal bulb and second portion of the                             duodenum.                           comment; hernia has increased in size since last                            exam anf there appears to be organo-axial rotation. Moderate Sedation:      Per Anesthesia Care Recommendation:           - Patient has a contact number available for                            emergencies. The signs and symptoms of potential                            delayed complications were discussed with the                            patient. Return to normal activities tomorrow.                            Written discharge instructions were provided to the                            patient.                           - Resume previous diet today.                           -  Continue present medications.                           - No aspirin, ibuprofen, naproxen, or other                            non-steroidal anti-inflammatory drugs for 1 day.                           - Await pathology results.                           - Do an upper GI series at appointment to be                            scheduled. Procedure Code(s):        --- Professional ---                           [phone], Esophagogastroduodenoscopy, flexible,                            transoral; with biopsy, single or multiple Diagnosis Code(s):        --- Professional ---                           K22.70, Barrett's esophagus without dysplasia                           K44.9, Diaphragmatic hernia without obstruction or                            gangrene                           K31.89, Other diseases of stomach and duodenum                           K21.9, Gastro-esophageal reflux disease without                            esophagitis CPT copyright 2019 [employer]. All rights reserved. The codes documented in this report are preliminary and upon coder review may  be revised to meet current compliance requirements. Hildred Laser, MD Hildred Laser,  MD 02/12/2021 7:57:57 AM This report has been signed electronically. Number of Addenda: 0

## 2021-02-12 NOTE — Transfer of Care (Signed)
Immediate Anesthesia Transfer of Care Note  Patient: Natalie Campos  Procedure(s) Performed: ESOPHAGOGASTRODUODENOSCOPY (EGD) WITH PROPOFOL BIOPSY  Patient Location: Short Stay  Anesthesia Type:General  Level of Consciousness: awake and oriented  Airway & Oxygen Therapy: Patient Spontanous Breathing  Post-op Assessment: Report given to RN and Post -op Vital signs reviewed and stable  Post vital signs: Reviewed and stable  Last Vitals:  Vitals Value Taken Time  BP    Temp    Pulse    Resp    SpO2      Last Pain:  Vitals:   02/12/21 0734  TempSrc:   PainSc: 0-No pain      Patients Stated Pain Goal: 8 (75/83/07 4600)  Complications: No notable events documented.

## 2021-02-13 LAB — SURGICAL PATHOLOGY

## 2021-02-14 ENCOUNTER — Other Ambulatory Visit (INDEPENDENT_AMBULATORY_CARE_PROVIDER_SITE_OTHER): Payer: Self-pay

## 2021-02-14 DIAGNOSIS — K449 Diaphragmatic hernia without obstruction or gangrene: Secondary | ICD-10-CM

## 2021-02-14 DIAGNOSIS — K2100 Gastro-esophageal reflux disease with esophagitis, without bleeding: Secondary | ICD-10-CM

## 2021-02-17 ENCOUNTER — Other Ambulatory Visit (INDEPENDENT_AMBULATORY_CARE_PROVIDER_SITE_OTHER): Payer: Self-pay | Admitting: *Deleted

## 2021-02-18 ENCOUNTER — Encounter (HOSPITAL_COMMUNITY): Payer: Self-pay | Admitting: Internal Medicine

## 2021-02-19 ENCOUNTER — Encounter: Payer: Self-pay | Admitting: Physical Medicine and Rehabilitation

## 2021-02-19 ENCOUNTER — Other Ambulatory Visit: Payer: Self-pay

## 2021-02-19 ENCOUNTER — Ambulatory Visit (INDEPENDENT_AMBULATORY_CARE_PROVIDER_SITE_OTHER): Payer: Medicare Other | Admitting: Physical Medicine and Rehabilitation

## 2021-02-19 ENCOUNTER — Ambulatory Visit: Payer: Self-pay

## 2021-02-19 VITALS — BP 141/94 | HR 102

## 2021-02-19 DIAGNOSIS — M5416 Radiculopathy, lumbar region: Secondary | ICD-10-CM | POA: Diagnosis not present

## 2021-02-19 MED ORDER — METHYLPREDNISOLONE ACETATE 80 MG/ML IJ SUSP
80.0000 mg | Freq: Once | INTRAMUSCULAR | Status: AC
  Administered 2021-02-19: 80 mg

## 2021-02-19 NOTE — Progress Notes (Signed)
Pt state lower back pain that travels down both legs, mostly her left knee. Pt state bending, walking and standing makes the pain worse. Pt state she takes pain meds and heating to help ease her pain. Pt has hx of inj on 12/19/20 pt state it didn't help.  Numeric Pain Rating Scale and Functional Assessment Average Pain 7   In the last MONTH (on 0-10 scale) has pain interfered with the following?  1. General activity like being  able to carry out your everyday physical activities such as walking, climbing stairs, carrying groceries, or moving a chair?  Rating(9)   +Driver, -BT, -Dye Allergies.

## 2021-02-19 NOTE — Patient Instructions (Signed)

## 2021-02-21 ENCOUNTER — Telehealth: Payer: Self-pay | Admitting: Family Medicine

## 2021-02-21 NOTE — Telephone Encounter (Signed)
Some of the blood pressure readings are good some of them are little bit high.  Continue current measures watch diet eat healthy try to stay active recommend follow-up office visit within 6 weeks to recheck

## 2021-02-21 NOTE — Telephone Encounter (Signed)
Please advise. Thank you

## 2021-02-21 NOTE — Telephone Encounter (Signed)
Patient has been advised per drs notes and recommendations, patient verbalizes understanding.

## 2021-02-21 NOTE — Telephone Encounter (Signed)
Patient ws told to call back with BP readings 10/6-136/95 am 126/92 pm 10/7-120/90 am  115/86 pm 10/8-121/86 10/9-/119/94 10/10-116/89 am 123/100 pm 10/11-123/87 10/12-125/86   pulse 100 10/13 132/94 am, 131/101 pm pulse 84 am, pulse 103 pm 10/14-148/104 pulse 80

## 2021-02-24 ENCOUNTER — Other Ambulatory Visit: Payer: Self-pay

## 2021-02-24 ENCOUNTER — Other Ambulatory Visit (INDEPENDENT_AMBULATORY_CARE_PROVIDER_SITE_OTHER): Payer: Self-pay | Admitting: Internal Medicine

## 2021-02-24 ENCOUNTER — Ambulatory Visit (HOSPITAL_COMMUNITY)
Admission: RE | Admit: 2021-02-24 | Discharge: 2021-02-24 | Disposition: A | Discharge status: Home / Self Care | Payer: Medicare Other | Source: Ambulatory Visit | Attending: Internal Medicine | Admitting: Internal Medicine

## 2021-02-24 DIAGNOSIS — K449 Diaphragmatic hernia without obstruction or gangrene: Secondary | ICD-10-CM | POA: Insufficient documentation

## 2021-02-24 DIAGNOSIS — K2100 Gastro-esophageal reflux disease with esophagitis, without bleeding: Secondary | ICD-10-CM | POA: Insufficient documentation

## 2021-02-24 DIAGNOSIS — K224 Dyskinesia of esophagus: Secondary | ICD-10-CM | POA: Diagnosis not present

## 2021-02-25 ENCOUNTER — Encounter: Payer: Self-pay | Admitting: Family Medicine

## 2021-02-27 ENCOUNTER — Ambulatory Visit (HOSPITAL_COMMUNITY): Payer: Medicare Other

## 2021-02-27 ENCOUNTER — Telehealth (INDEPENDENT_AMBULATORY_CARE_PROVIDER_SITE_OTHER): Payer: Self-pay

## 2021-02-27 NOTE — Telephone Encounter (Signed)
Patient made aware.  Per Dr. Laural Golden he has spoken with Dr. Johnathan Hausen and his office will contact the patient. Patient may do lite work, nothing strenuous, may paint,but do on empty stomach as if not may result in Reflux.

## 2021-02-27 NOTE — Telephone Encounter (Signed)
Patient called today stating she was told by Dr. Laural Golden that her hernia has started to twist. She is helping her daughter to move and paint. I advised she should not do any strenuous work until after she has bee evaluated by Psychologist, sport and exercise. She states Dr.Rehman was going to speak to someone Sales promotion account executive) and get back with her to let her know what she needs to do.

## 2021-03-04 DIAGNOSIS — R5382 Chronic fatigue, unspecified: Secondary | ICD-10-CM | POA: Diagnosis not present

## 2021-03-04 DIAGNOSIS — M5459 Other low back pain: Secondary | ICD-10-CM | POA: Diagnosis not present

## 2021-03-04 DIAGNOSIS — G35 Multiple sclerosis: Secondary | ICD-10-CM | POA: Diagnosis not present

## 2021-03-04 DIAGNOSIS — G894 Chronic pain syndrome: Secondary | ICD-10-CM | POA: Diagnosis not present

## 2021-03-04 DIAGNOSIS — G4733 Obstructive sleep apnea (adult) (pediatric): Secondary | ICD-10-CM | POA: Diagnosis not present

## 2021-03-04 DIAGNOSIS — Z79899 Other long term (current) drug therapy: Secondary | ICD-10-CM | POA: Diagnosis not present

## 2021-03-04 DIAGNOSIS — R262 Difficulty in walking, not elsewhere classified: Secondary | ICD-10-CM | POA: Diagnosis not present

## 2021-03-06 NOTE — Progress Notes (Signed)
Natalie Campos - 60 y.o. female MRN [id number]  Date of birth: [birth year]  Office Visit Note: Visit Date: 02/19/2021 PCP: Kathyrn Drown, MD Referred by: Kathyrn Drown, MD  Subjective: Chief Complaint  Patient presents with   Lower Back - Pain   Left Leg - Pain   Right Leg - Pain   HPI:  Natalie Campos is a 60 y.o. female who comes in today at the request of Dr. Anderson Malta for planned Right S1-2 Lumbar Transforaminal epidural steroid injection with fluoroscopic guidance.  The patient has failed conservative care including home exercise, medications, time and activity modification.  This injection will be diagnostic and hopefully therapeutic.  Please see requesting physician notes for further details and justification. MRI reviewed with images and spine model.  MRI reviewed in the note below.  Prior interlaminar epidural injection did not seem to help very much.  MRI shows disc osteophyte with narrowing on the right probably affecting the S1 nerve root.  She is having symptoms on both sides but mainly the right.  She has multiple joint problems as well.  Case complicated by multiple sclerosis.  Pain management managed by Dr. Phillips Odor.    ROS Otherwise per HPI.  Assessment & Plan: Visit Diagnoses:    ICD-10-CM   1. Lumbar radiculopathy  M54.16 XR C-ARM NO REPORT    Epidural Steroid injection    methylPREDNISolone acetate (DEPO-MEDROL) injection 80 mg      Plan: No additional findings.   Meds & Orders:  Meds ordered this encounter  Medications   methylPREDNISolone acetate (DEPO-MEDROL) injection 80 mg    Orders Placed This Encounter  Procedures   XR C-ARM NO REPORT   Epidural Steroid injection    Follow-up: Return if symptoms worsen or fail to improve.   Procedures: No procedures performed  S1 Lumbosacral Transforaminal Epidural Steroid Injection - Sub-Pedicular Approach with Fluoroscopic Guidance   Patient: Natalie Campos      Date of Birth: 06-May-1961 MRN:  [id number] PCP: Kathyrn Drown, MD      Visit Date: 02/19/2021   Universal Protocol:    Date/Time: 10/27/225:21 AM  Consent Given By: the patient  Position:  PRONE  Additional Comments: Vital signs were monitored before and after the procedure. Patient was prepped and draped in the usual sterile fashion. The correct patient, procedure, and site was verified.   Injection Procedure Details:  Procedure Site One Meds Administered:  Meds ordered this encounter  Medications   methylPREDNISolone acetate (DEPO-MEDROL) injection 80 mg    Laterality: Right  Location/Site:  S1 Foramen   Needle size: 22 ga.  Needle type: Spinal  Needle Placement: Transforaminal  Findings:   -Comments: Excellent flow of contrast along the nerve, nerve root and into the epidural space.  Epidurogram: Contrast epidurogram showed no nerve root cut off or restricted flow pattern.  Procedure Details: After squaring off the sacral end-plate to get a true AP view, the C-arm was positioned so that the best possible view of the S1 foramen was visualized. The soft tissues overlying this structure were infiltrated with 2-3 ml. of 1% Lidocaine without Epinephrine.    The spinal needle was inserted toward the target using a "trajectory" view along the fluoroscope beam.  Under AP and lateral visualization, the needle was advanced so it did not puncture dura. Biplanar projections were used to confirm position. Aspiration was confirmed to be negative for CSF and/or blood. A 1-2 ml. volume of Isovue-250 was injected  and flow of contrast was noted at each level. Radiographs were obtained for documentation purposes.   After attaining the desired flow of contrast documented above, a 0.5 to 1.0 ml test dose of 0.25% Marcaine was injected into each respective transforaminal space.  The patient was observed for 90 seconds post injection.  After no sensory deficits were reported, and normal lower extremity motor function  was noted,   the above injectate was administered so that equal amounts of the injectate were placed at each foramen (level) into the transforaminal epidural space.   Additional Comments:  The patient tolerated the procedure well Dressing: Band-Aid with 2 x 2 sterile gauze    Post-procedure details: Patient was observed during the procedure. Post-procedure instructions were reviewed.  Patient left the clinic in stable condition.   Clinical History: MRI LUMBAR SPINE WITHOUT CONTRAST   TECHNIQUE: Multiplanar, multisequence MR imaging of the lumbar spine was performed. No intravenous contrast was administered.   COMPARISON:  07/30/2011   FINDINGS: Segmentation:  5 lumbar type vertebrae   Alignment:  Mild scoliosis.   Vertebrae:  No fracture, evidence of discitis, or bone lesion.   Conus medullaris and cauda equina: Conus extends to the L1-2 level. Conus and cauda equina appear normal.   Paraspinal and other soft tissues: Partially covered right renal cyst. No perispinal mass or inflammation noted.   Disc levels:   T12- L1: Unremarkable.   L1-L2: Mild disc bulging.   L2-L3: Mild disc narrowing and bulging. Early facet spurring on the left   L3-L4: Mild disc narrowing and right eccentric bulging. Mild facet spurring.   L4-L5: Degenerative facet spurring.  Mild disc bulging.   L5-S1:Greatest level of degenerative disc narrowing which is right eccentric. Disc bulging and ridging towards the right where there is also greater facet spurring. Moderate right foraminal impingement. Asymmetric right subarticular recess narrowing that could affect the right S1 nerve root.   IMPRESSION: 1. Similar degree of lumbar spine degeneration with mild scoliosis when compared to 2018. 2. Neural impingement limited to the L5-S1 level at the right foramen and subarticular recess     Electronically Signed   By: Monte Fantasia M.D.   On: 09/28/2020 16:09     Objective:  VS:   HT:    WT:   BMI:     BP:(!) 141/94  HR:(!) 102bpm  TEMP: ( )  RESP:  Physical Exam Vitals and nursing note reviewed.  Constitutional:      General: She is not in acute distress.    Appearance: Normal appearance. She is not ill-appearing.  HENT:     Head: Normocephalic and atraumatic.     Right Ear: External ear normal.     Left Ear: External ear normal.  Eyes:     Extraocular Movements: Extraocular movements intact.  Cardiovascular:     Rate and Rhythm: Normal rate.     Pulses: Normal pulses.  Pulmonary:     Effort: Pulmonary effort is normal. No respiratory distress.  Abdominal:     General: There is no distension.     Palpations: Abdomen is soft.  Musculoskeletal:        General: Tenderness present.     Cervical back: Neck supple.     Right lower leg: No edema.     Left lower leg: No edema.     Comments: Patient has good distal strength with no pain over the greater trochanters.  No clonus or focal weakness.  Skin:    Findings: No erythema,  lesion or rash.  Neurological:     General: No focal deficit present.     Mental Status: She is alert and oriented to person, place, and time.     Sensory: No sensory deficit.     Motor: No weakness or abnormal muscle tone.     Coordination: Coordination normal.  Psychiatric:        Mood and Affect: Mood normal.        Behavior: Behavior normal.     Imaging: No results found.

## 2021-03-06 NOTE — Procedures (Signed)
S1 Lumbosacral Transforaminal Epidural Steroid Injection - Sub-Pedicular Approach with Fluoroscopic Guidance   Patient: Natalie Campos      Date of Birth: November 21, 1960 MRN: [id number] PCP: Kathyrn Drown, MD      Visit Date: 02/19/2021   Universal Protocol:    Date/Time: 10/27/225:21 AM  Consent Given By: the patient  Position:  PRONE  Additional Comments: Vital signs were monitored before and after the procedure. Patient was prepped and draped in the usual sterile fashion. The correct patient, procedure, and site was verified.   Injection Procedure Details:  Procedure Site One Meds Administered:  Meds ordered this encounter  Medications   methylPREDNISolone acetate (DEPO-MEDROL) injection 80 mg    Laterality: Right  Location/Site:  S1 Foramen   Needle size: 22 ga.  Needle type: Spinal  Needle Placement: Transforaminal  Findings:   -Comments: Excellent flow of contrast along the nerve, nerve root and into the epidural space.  Epidurogram: Contrast epidurogram showed no nerve root cut off or restricted flow pattern.  Procedure Details: After squaring off the sacral end-plate to get a true AP view, the C-arm was positioned so that the best possible view of the S1 foramen was visualized. The soft tissues overlying this structure were infiltrated with 2-3 ml. of 1% Lidocaine without Epinephrine.    The spinal needle was inserted toward the target using a "trajectory" view along the fluoroscope beam.  Under AP and lateral visualization, the needle was advanced so it did not puncture dura. Biplanar projections were used to confirm position. Aspiration was confirmed to be negative for CSF and/or blood. A 1-2 ml. volume of Isovue-250 was injected and flow of contrast was noted at each level. Radiographs were obtained for documentation purposes.   After attaining the desired flow of contrast documented above, a 0.5 to 1.0 ml test dose of 0.25% Marcaine was injected into each  respective transforaminal space.  The patient was observed for 90 seconds post injection.  After no sensory deficits were reported, and normal lower extremity motor function was noted,   the above injectate was administered so that equal amounts of the injectate were placed at each foramen (level) into the transforaminal epidural space.   Additional Comments:  The patient tolerated the procedure well Dressing: Band-Aid with 2 x 2 sterile gauze    Post-procedure details: Patient was observed during the procedure. Post-procedure instructions were reviewed.  Patient left the clinic in stable condition.

## 2021-03-12 ENCOUNTER — Telehealth: Payer: Self-pay | Admitting: Family Medicine

## 2021-03-12 DIAGNOSIS — E876 Hypokalemia: Secondary | ICD-10-CM

## 2021-03-12 MED ORDER — HYDROCHLOROTHIAZIDE 12.5 MG PO CAPS: ORAL_CAPSULE | ORAL | 5 refills | Status: DC

## 2021-03-12 MED ORDER — POTASSIUM CHLORIDE ER 10 MEQ PO TBCR: EXTENDED_RELEASE_TABLET | ORAL | 0 refills | Status: DC

## 2021-03-12 NOTE — Telephone Encounter (Signed)
Pt contacted and verbalized understanding. Pt states she just refilled her Potassium and received it today. Informed pt to begin to take 3 per day and call us when she runs low so we can refill it. Directions updated in Epic. HCTZ sent to [facility]. Lab orders placed. Pt sees surgeon on Friday and will call us after the visit to schedule follow up.

## 2021-03-12 NOTE — Telephone Encounter (Signed)
Blood pressure readings have stayed consistently elevated  I would recommend adding HCTZ 12.5 mg 1 daily, #30 with 5 refills Also recommend adjusting her potassium 10 mill equivalent currently she is taking 2/day I recommend 3/day  Also recommend metabolic 7 within 7 to 14 days  Also recommend follow-up office visit within 3 to 6 weeks-if her surgery gets in the way of this she needs to check her blood pressure readings at home and send [place] some readings within 2 to 4 weeks

## 2021-03-12 NOTE — Addendum Note (Signed)
Addended by: Vicente Males on: 03/12/2021 02:17 PM   Modules accepted: Orders

## 2021-03-12 NOTE — Telephone Encounter (Signed)
Blood pressure elevated. Blood pressures placed in provider office. Please advise. Thank you

## 2021-03-14 DIAGNOSIS — K219 Gastro-esophageal reflux disease without esophagitis: Secondary | ICD-10-CM | POA: Diagnosis not present

## 2021-03-14 DIAGNOSIS — K449 Diaphragmatic hernia without obstruction or gangrene: Secondary | ICD-10-CM | POA: Diagnosis not present

## 2021-03-18 NOTE — Progress Notes (Signed)
Requested surgery orders with Dr. Earlie Server office.

## 2021-03-19 NOTE — Progress Notes (Signed)
DUE TO COVID-19 ONLY ONE VISITOR IS ALLOWED TO COME WITH YOU AND STAY IN THE WAITING ROOM ONLY DURING PRE OP AND PROCEDURE DAY OF SURGERY. THE 1 VISITOR  MAY VISIT WITH YOU AFTER SURGERY IN YOUR PRIVATE ROOM DURING VISITING HOURS ONLY!  YOU NEED TO HAVE A COVID 19 TEST ON__11/03/2021 _____ @_______ , THIS TEST MUST BE DONE BEFORE SURGERY,  COVID TESTING SITE IS AT [address] [place]. PLEASE REMAIN IN YOUR CAR THIS IS A DRIVER UP TEST. AFTER YOUR COVID TEST PLEASE WEAR A MASK OUT IN PUBLIC AND SOCIAL DISTANCE AND Natalie Campos YOUR HANDS FREQUENTLY. PLEASE ASK ALL YOUR CLOSE CONTACTS TO WEAR A MASK OUT IN PUBLIC AND SOCIAL DISTANCE AND Natalie Campos HANDS FREQUENTLY ALSO.               Natalie Campos FIRST  03/19/2021   Your procedure is scheduled on:  03/24/2021   Report to [facility] Main  Entrance   Report to admitting at  [place] AM     Call this number if you have problems the morning of surgery [phone]    Remember: Do not eat food , candy gum or mints :After Midnight. You may have clear liquids from midnight until __ 0415am    CLEAR LIQUID DIET   Foods Allowed                                                                       Coffee and tea, regular and decaf                              Plain Jell-O any favor except red or purple                                            Fruit ices (not with fruit pulp)                                      Iced Popsicles                                     Carbonated beverages, regular and diet                                    Cranberry, grape and apple juices Sports drinks like Gatorade Lightly seasoned clear broth or consume(fat free) Sugar   _____________________________________________________________________    BRUSH YOUR TEETH MORNING OF SURGERY AND RINSE YOUR MOUTH OUT, NO CHEWING GUM CANDY OR MINTS.     Take these medicines the morning of surgery with A SIP OF WATER:  amlodipine, cymbalta, valium, gabapentin, ditropan,  potassium topamax   DO NOT TAKE ANY DIABETIC MEDICATIONS DAY OF YOUR SURGERY  You may not have any metal on your body including hair pins and              piercings  Do not wear jewelry, make-up, lotions, powders or perfumes, deodorant             Do not wear nail polish on your fingernails.  Do not shave  48 hours prior to surgery.              Men may shave face and neck.   Do not bring valuables to the hospital. [place].  Contacts, dentures or bridgework may not be worn into surgery.  Leave suitcase in the car. After surgery it may be brought to your room.     Patients discharged the day of surgery will not be allowed to drive home. IF YOU ARE HAVING SURGERY AND GOING HOME THE SAME DAY, YOU MUST HAVE AN ADULT TO DRIVE YOU HOME AND BE WITH YOU FOR 24 HOURS. YOU MAY GO HOME BY TAXI OR UBER OR ORTHERWISE, BUT AN ADULT MUST ACCOMPANY YOU HOME AND STAY WITH YOU FOR 24 HOURS.  Name and phone number of your driver:  Special Instructions: N/A              Please read over the following fact sheets you were given: _____________________________________________________________________  [facility] - Preparing for Surgery Before surgery, you can play an important role.  Because skin is not sterile, your skin needs to be as free of germs as possible.  You can reduce the number of germs on your skin by washing with CHG (chlorahexidine gluconate) soap before surgery.  CHG is an antiseptic cleaner which kills germs and bonds with the skin to continue killing germs even after washing. Please DO NOT use if you have an allergy to CHG or antibacterial soaps.  If your skin becomes reddened/irritated stop using the CHG and inform your nurse when you arrive at Short Stay. Do not shave (including legs and underarms) for at least 48 hours prior to the first CHG shower.  You may shave your face/neck. Please follow these instructions  carefully:  1.  Shower with CHG Soap the night before surgery and the  morning of Surgery.  2.  If you choose to wash your hair, wash your hair first as usual with your  normal  shampoo.  3.  After you shampoo, rinse your hair and body thoroughly to remove the  shampoo.                           4.  Use CHG as you would any other liquid soap.  You can apply chg directly  to the skin and wash                       Gently with a scrungie or clean washcloth.  5.  Apply the CHG Soap to your body ONLY FROM THE NECK DOWN.   Do not use on face/ open                           Wound or open sores. Avoid contact with eyes, ears mouth and genitals (private parts).  Wash face,  Genitals (private parts) with your normal soap.             6.  Wash thoroughly, paying special attention to the area where your surgery  will be performed.  7.  Thoroughly rinse your body with warm water from the neck down.  8.  DO NOT shower/wash with your normal soap after using and rinsing off  the CHG Soap.                9.  Pat yourself dry with a clean towel.            10.  Wear clean pajamas.            11.  Place clean sheets on your bed the night of your first shower and do not  sleep with pets. Day of Surgery : Do not apply any lotions/deodorants the morning of surgery.  Please wear clean clothes to the hospital/surgery center.  FAILURE TO FOLLOW THESE INSTRUCTIONS MAY RESULT IN THE CANCELLATION OF YOUR SURGERY PATIENT SIGNATURE_________________________________  NURSE SIGNATURE__________________________________  ________________________________________________________________________

## 2021-03-19 NOTE — Progress Notes (Addendum)
Anesthesia Review:  PCP: Sallee Lange LOV 02/11/2021  Cardiologist : none  Neurology- Dr Merlene Laughter - requested most recent ov note.  LVMM .  Chest x-ray : EKG :02/10/21  Echo : 2019  Stress test: Cardiac Cath :  Activity level: can do a flight of stairs slowly  Sleep Study/ CPAP : none  Fasting Blood Sugar :      / Checks Blood Sugar -- times a day:   Blood Thinner/ Instructions /Last Dose: ASA / Instructions/ Last Dose :   Pt has MS.  Covid tst- 03/21/21.   Reviewed with husband direction for covid test and review of what meds to take am of surgery since husband is aware when pt takes meds Husband voiced understanding.  Orders requested on 03/18/2021 and 03/19/2021.  No orders in epic at time of preop appt.   Requested orders again on 03/21/2021.   BMP done 03/21/21 routed to Dr Hassell Done. Orders in after preop appt.

## 2021-03-21 ENCOUNTER — Other Ambulatory Visit: Payer: Self-pay | Admitting: Surgery

## 2021-03-21 ENCOUNTER — Encounter (HOSPITAL_COMMUNITY): Payer: Self-pay

## 2021-03-21 ENCOUNTER — Other Ambulatory Visit: Payer: Self-pay

## 2021-03-21 ENCOUNTER — Encounter (HOSPITAL_COMMUNITY)
Admission: RE | Admit: 2021-03-21 | Discharge: 2021-03-21 | Disposition: A | Discharge status: Home / Self Care | Payer: Medicare Other | Source: Ambulatory Visit | Attending: Surgery | Admitting: Surgery

## 2021-03-21 VITALS — BP 126/98 | HR 103 | Temp 98.6°F | Resp 16 | Ht 69.0 in | Wt 229.3 lb

## 2021-03-21 DIAGNOSIS — Z888 Allergy status to other drugs, medicaments and biological substances status: Secondary | ICD-10-CM | POA: Diagnosis not present

## 2021-03-21 DIAGNOSIS — M1990 Unspecified osteoarthritis, unspecified site: Secondary | ICD-10-CM | POA: Diagnosis not present

## 2021-03-21 DIAGNOSIS — Z8249 Family history of ischemic heart disease and other diseases of the circulatory system: Secondary | ICD-10-CM | POA: Diagnosis not present

## 2021-03-21 DIAGNOSIS — K580 Irritable bowel syndrome with diarrhea: Secondary | ICD-10-CM | POA: Diagnosis not present

## 2021-03-21 DIAGNOSIS — Z880 Allergy status to penicillin: Secondary | ICD-10-CM | POA: Diagnosis not present

## 2021-03-21 DIAGNOSIS — R1310 Dysphagia, unspecified: Secondary | ICD-10-CM | POA: Diagnosis not present

## 2021-03-21 DIAGNOSIS — Z87891 Personal history of nicotine dependence: Secondary | ICD-10-CM | POA: Diagnosis not present

## 2021-03-21 DIAGNOSIS — G35 Multiple sclerosis: Secondary | ICD-10-CM | POA: Diagnosis not present

## 2021-03-21 DIAGNOSIS — K440 Diaphragmatic hernia with obstruction, without gangrene: Secondary | ICD-10-CM | POA: Diagnosis not present

## 2021-03-21 DIAGNOSIS — D649 Anemia, unspecified: Secondary | ICD-10-CM | POA: Diagnosis not present

## 2021-03-21 DIAGNOSIS — K259 Gastric ulcer, unspecified as acute or chronic, without hemorrhage or perforation: Secondary | ICD-10-CM | POA: Diagnosis not present

## 2021-03-21 DIAGNOSIS — I10 Essential (primary) hypertension: Secondary | ICD-10-CM

## 2021-03-21 DIAGNOSIS — Z79899 Other long term (current) drug therapy: Secondary | ICD-10-CM | POA: Insufficient documentation

## 2021-03-21 DIAGNOSIS — Z01812 Encounter for preprocedural laboratory examination: Secondary | ICD-10-CM | POA: Insufficient documentation

## 2021-03-21 DIAGNOSIS — K219 Gastro-esophageal reflux disease without esophagitis: Secondary | ICD-10-CM | POA: Diagnosis not present

## 2021-03-21 DIAGNOSIS — K562 Volvulus: Secondary | ICD-10-CM | POA: Diagnosis not present

## 2021-03-21 DIAGNOSIS — K449 Diaphragmatic hernia without obstruction or gangrene: Secondary | ICD-10-CM | POA: Diagnosis not present

## 2021-03-21 DIAGNOSIS — E785 Hyperlipidemia, unspecified: Secondary | ICD-10-CM | POA: Diagnosis not present

## 2021-03-21 DIAGNOSIS — Z87892 Personal history of anaphylaxis: Secondary | ICD-10-CM | POA: Diagnosis not present

## 2021-03-21 HISTORY — DX: Anxiety disorder, unspecified: F41.9

## 2021-03-21 LAB — CBC
HCT: 45.9 % (ref 36.0–46.0)
Hemoglobin: 15.7 g/dL — ABNORMAL HIGH (ref 12.0–15.0)
MCH: 31.5 pg (ref 26.0–34.0)
MCHC: 34.2 g/dL (ref 30.0–36.0)
MCV: 92.2 fL (ref 80.0–100.0)
Platelets: 263 10*3/uL (ref 150–400)
RBC: 4.98 MIL/uL (ref 3.87–5.11)
RDW: 12.4 % (ref 11.5–15.5)
WBC: 7.9 10*3/uL (ref 4.0–10.5)
nRBC: 0 % (ref 0.0–0.2)

## 2021-03-21 LAB — BASIC METABOLIC PANEL
Anion gap: 9 (ref 5–15)
BUN: 13 mg/dL (ref 8–23)
CO2: 25 mmol/L (ref 22–32)
Calcium: 9.4 mg/dL (ref 8.9–10.3)
Chloride: 104 mmol/L (ref 98–111)
Creatinine, Ser: 1 mg/dL (ref 0.44–1.00)
GFR, Estimated: >60 mL/min (ref >60)
Glucose, Bld: 130 mg/dL — ABNORMAL HIGH (ref 70–99)
Potassium: 3.1 mmol/L — ABNORMAL LOW (ref 3.5–5.1)
Sodium: 138 mmol/L (ref 135–145)

## 2021-03-21 LAB — SARS CORONAVIRUS 2 (TAT 6-24 HRS): SARS Coronavirus 2: NEGATIVE

## 2021-03-21 NOTE — H&P (Signed)
REFERRING PHYSICIAN: Saverio Danker, MD  PROVIDER: Joya San, MD  MRN: [id number] DOB: [birth year] DATE OF ENCOUNTER: 03/14/2021  Subjective   Chief Complaint: New Consultation (Hiatal Hernia)   History of Present Illness: Natalie Campos is a 60 y.o. female who is seen today as an office consultation at the request of Dr. Laural Golden for evaluation of New Consultation (Hiatal Hernia) .   She has a history going back about 20 years of severe reflux requiring Rolaids and Tums. As a hiatal hernia got larger the reflux pain got better. She is now developed Cameron ulcers and anemia. She is having pain and really severe dysphagia. She was scoped recently by Dr. Laural Golden and I reviewed his findings as well as I looked at her upper GI showing a rather complex type III mixed hiatal hernia with volvulus.   She and her husband were present and we discussed robotic hiatal hernia repair with fundoplication. I drew this out form and I gave him a booklet about it. She has been miserable with nocturnal coughing and choking and would like to get this done as soon as possible. We will schedule ASAP.  Review of Systems: See HPI as well for other ROS.  ROS   Medical History: Past Medical History:  Diagnosis Date   Anemia   Arthritis   GERD (gastroesophageal reflux disease)   Hypertension   There is no problem list on file for this patient.  Past Surgical History:  Procedure Laterality Date   CHOLECYSTECTOMY    Allergies  Allergen Reactions   Ace Inhibitors Anaphylaxis   Losartan Swelling  Severe tongue swelling   Penicillins Anaphylaxis  Childhood Allergy - caused coma  Has patient had a PCN reaction causing immediate rash, facial/tongue/throat swelling, SOB or lightheadedness with hypotension: No Has patient had a PCN reaction causing severe rash involving mucus membranes or skin necrosis: No Has patient had a PCN reaction that required hospitalization: No Has patient  had a PCN reaction occurring within the last 10 years: No If all of the above answers are "NO", then may proceed with Cephalosporin use.   Current Outpatient Medications on File Prior to Visit  Medication Sig Dispense Refill   amLODIPine (NORVASC) 5 MG tablet amlodipine 5 mg tablet   famotidine (PEPCID) 20 MG tablet famotidine 20 mg tablet   ferrous sulfate 325 (65 FE) MG tablet Take 325 mg by mouth daily with breakfast   potassium chloride (KLOR-CON) 10 MEQ ER tablet Take one tablet po 3 times per day   baclofen (LIORESAL) 10 MG tablet baclofen 10 mg tablet   diazePAM (VALIUM) 10 MG tablet diazepam 10 mg tablet   DULoxetine (CYMBALTA) 30 MG DR capsule Take by mouth   gabapentin (NEURONTIN) 600 MG tablet gabapentin 600 mg tablet   HYDROmorphone (DILAUDID) 4 MG tablet hydromorphone 4 mg tablet   multivitamin tablet Take 1 tablet by mouth   omeprazole (PRILOSEC) 40 MG DR capsule TAKE 1 CAPSULE BY MOUTH ONCE DAILY BEFORE SUPPER   oxybutynin (DITROPAN) 5 mg tablet oxybutynin chloride 5 mg tablet   topiramate (TOPAMAX) 100 MG tablet topiramate 100 mg tablet   traMADoL (ULTRAM) 50 mg tablet tramadol 50 mg tablet   vitamin E 1000 UNIT capsule Take by mouth   No current facility-administered medications on file prior to visit.   Family History  Problem Relation Age of Onset   Skin cancer Mother   Coronary Artery Disease (Blocked arteries around heart) Father   Obesity Sister   High  blood pressure (Hypertension) Sister   Hyperlipidemia (Elevated cholesterol) Sister    Social History   Tobacco Use  Smoking Status Former Smoker   Quit date: 2010   Years since quitting: 12.8  Smokeless Tobacco Never Used    Social History   Socioeconomic History   Marital status: Married  Tobacco Use   Smoking status: Former Smoker  Quit date: 2010  Years since quitting: 12.8   Smokeless tobacco: Never Used  Scientific laboratory technician Use: Never used  Substance and Sexual Activity   Alcohol use:  Never   Drug use: Never   Objective:   Vitals:  03/14/21 1441  BP: (!) 150/90  Pulse: (!) 132  SpO2: 98%  Weight: (!) 103.4 kg (228 lb)  Height: 175.3 cm (5\' 9" )   Body mass index is 33.67 kg/m.  Physical Exam General: Well-developed white female with a cane because she has underlying multiple sclerosis HEENT : Unremarkable except some hoarseness Chest: Clear to auscultation Heart: Sinus rhythm without murmurs Breast: Not examined Abdomen: Previous lap chole incisions nontender GU not examined Rectal not performed Extremities range of motion intact. Patient does walk with a cane for stability. Neuro alert and oriented x3. I did not complete them motor or sensory exam to document MS findings  Labs, Imaging and Diagnostic Testing: Upper GI series reviewed  Assessment and Plan:    Hiatal hernia with gastroesophageal reflux    Large type III mixed hiatal hernia with some evidence of volvulus. Plan robotic hiatal hernia repair soon.    Natalie Ransome Donia Pounds, MD

## 2021-03-23 ENCOUNTER — Other Ambulatory Visit: Payer: Self-pay | Admitting: Family Medicine

## 2021-03-24 ENCOUNTER — Ambulatory Visit (HOSPITAL_COMMUNITY): Payer: Medicare Other | Admitting: Physician Assistant

## 2021-03-24 ENCOUNTER — Encounter (HOSPITAL_COMMUNITY): Payer: Self-pay | Admitting: Surgery

## 2021-03-24 ENCOUNTER — Ambulatory Visit (HOSPITAL_COMMUNITY): Payer: Medicare Other | Admitting: Certified Registered Nurse Anesthetist

## 2021-03-24 ENCOUNTER — Other Ambulatory Visit: Payer: Self-pay

## 2021-03-24 ENCOUNTER — Inpatient Hospital Stay (HOSPITAL_COMMUNITY)
Admission: AD | Admit: 2021-03-24 | Discharge: 2021-03-26 | DRG: 326 | Disposition: A | Discharge status: Home / Self Care | Payer: Medicare Other | Attending: Surgery | Admitting: Surgery

## 2021-03-24 ENCOUNTER — Encounter (HOSPITAL_COMMUNITY)
Admission: AD | Disposition: A | Discharge status: Home / Self Care | Payer: Self-pay | Source: Home / Self Care | Attending: Surgery

## 2021-03-24 DIAGNOSIS — M1990 Unspecified osteoarthritis, unspecified site: Secondary | ICD-10-CM | POA: Diagnosis present

## 2021-03-24 DIAGNOSIS — K562 Volvulus: Secondary | ICD-10-CM | POA: Diagnosis present

## 2021-03-24 DIAGNOSIS — K449 Diaphragmatic hernia without obstruction or gangrene: Secondary | ICD-10-CM | POA: Diagnosis present

## 2021-03-24 DIAGNOSIS — Z8249 Family history of ischemic heart disease and other diseases of the circulatory system: Secondary | ICD-10-CM

## 2021-03-24 DIAGNOSIS — K259 Gastric ulcer, unspecified as acute or chronic, without hemorrhage or perforation: Secondary | ICD-10-CM | POA: Diagnosis present

## 2021-03-24 DIAGNOSIS — K440 Diaphragmatic hernia with obstruction, without gangrene: Principal | ICD-10-CM | POA: Diagnosis present

## 2021-03-24 DIAGNOSIS — K219 Gastro-esophageal reflux disease without esophagitis: Secondary | ICD-10-CM | POA: Diagnosis present

## 2021-03-24 DIAGNOSIS — Z880 Allergy status to penicillin: Secondary | ICD-10-CM

## 2021-03-24 DIAGNOSIS — E785 Hyperlipidemia, unspecified: Secondary | ICD-10-CM | POA: Diagnosis not present

## 2021-03-24 DIAGNOSIS — Z87892 Personal history of anaphylaxis: Secondary | ICD-10-CM | POA: Diagnosis not present

## 2021-03-24 DIAGNOSIS — D649 Anemia, unspecified: Secondary | ICD-10-CM | POA: Diagnosis present

## 2021-03-24 DIAGNOSIS — G35 Multiple sclerosis: Secondary | ICD-10-CM | POA: Diagnosis present

## 2021-03-24 DIAGNOSIS — K580 Irritable bowel syndrome with diarrhea: Secondary | ICD-10-CM | POA: Diagnosis not present

## 2021-03-24 DIAGNOSIS — R1310 Dysphagia, unspecified: Secondary | ICD-10-CM | POA: Diagnosis present

## 2021-03-24 DIAGNOSIS — Z79899 Other long term (current) drug therapy: Secondary | ICD-10-CM | POA: Diagnosis not present

## 2021-03-24 DIAGNOSIS — Z888 Allergy status to other drugs, medicaments and biological substances status: Secondary | ICD-10-CM | POA: Diagnosis not present

## 2021-03-24 DIAGNOSIS — I10 Essential (primary) hypertension: Secondary | ICD-10-CM | POA: Diagnosis present

## 2021-03-24 DIAGNOSIS — Z87891 Personal history of nicotine dependence: Secondary | ICD-10-CM | POA: Diagnosis not present

## 2021-03-24 DIAGNOSIS — Z9889 Other specified postprocedural states: Secondary | ICD-10-CM

## 2021-03-24 HISTORY — PX: ESOPHAGOGASTRODUODENOSCOPY: SHX5428

## 2021-03-24 HISTORY — DX: Unspecified cataract: H26.9

## 2021-03-24 HISTORY — PX: XI ROBOTIC ASSISTED HIATAL HERNIA REPAIR: SHX6889

## 2021-03-24 LAB — CBC
HCT: 42.9 % (ref 36.0–46.0)
Hemoglobin: 14.6 g/dL (ref 12.0–15.0)
MCH: 31.5 pg (ref 26.0–34.0)
MCHC: 34 g/dL (ref 30.0–36.0)
MCV: 92.5 fL (ref 80.0–100.0)
Platelets: 224 10*3/uL (ref 150–400)
RBC: 4.64 MIL/uL (ref 3.87–5.11)
RDW: 12.7 % (ref 11.5–15.5)
WBC: 14.7 10*3/uL — ABNORMAL HIGH (ref 4.0–10.5)
nRBC: 0 % (ref 0.0–0.2)

## 2021-03-24 LAB — CREATININE, SERUM
Creatinine, Ser: 0.88 mg/dL (ref 0.44–1.00)
GFR, Estimated: >60 mL/min (ref >60)

## 2021-03-24 SURGERY — REPAIR, HERNIA, HIATAL, ROBOT-ASSISTED
Anesthesia: General | Site: Abdomen

## 2021-03-24 MED ORDER — DIAZEPAM 5 MG PO TABS
5.0000 mg | ORAL_TABLET | Freq: Three times a day (TID) | ORAL | Status: DC | PRN
  Administered 2021-03-24 – 2021-03-25 (×2): 5 mg via ORAL
  Filled 2021-03-24 (×2): qty 1

## 2021-03-24 MED ORDER — KCL IN DEXTROSE-NACL 20-5-0.45 MEQ/L-%-% IV SOLN
INTRAVENOUS | Status: DC
  Filled 2021-03-24 (×4): qty 1000

## 2021-03-24 MED ORDER — CHLORHEXIDINE GLUCONATE CLOTH 2 % EX PADS: 6.0000 | MEDICATED_PAD | Freq: Once | CUTANEOUS | Status: DC

## 2021-03-24 MED ORDER — MIDAZOLAM HCL 5 MG/5ML IJ SOLN
INTRAMUSCULAR | Status: DC | PRN
  Administered 2021-03-24: 2 mg via INTRAVENOUS

## 2021-03-24 MED ORDER — DEXMEDETOMIDINE (PRECEDEX) IN NS 20 MCG/5ML (4 MCG/ML) IV SYRINGE
PREFILLED_SYRINGE | INTRAVENOUS | Status: AC
  Filled 2021-03-24: qty 5

## 2021-03-24 MED ORDER — ONDANSETRON HCL 4 MG/2ML IJ SOLN: 4.0000 mg | Freq: Once | INTRAMUSCULAR | Status: DC | PRN

## 2021-03-24 MED ORDER — SUCCINYLCHOLINE CHLORIDE 200 MG/10ML IV SOSY
PREFILLED_SYRINGE | INTRAVENOUS | Status: AC
  Filled 2021-03-24: qty 10

## 2021-03-24 MED ORDER — SODIUM CHLORIDE (PF) 0.9 % IJ SOLN
INTRAMUSCULAR | Status: DC | PRN
  Administered 2021-03-24: 10 mL

## 2021-03-24 MED ORDER — EPHEDRINE 5 MG/ML INJ
INTRAVENOUS | Status: AC
  Filled 2021-03-24: qty 5

## 2021-03-24 MED ORDER — DEXAMETHASONE SODIUM PHOSPHATE 4 MG/ML IJ SOLN
INTRAMUSCULAR | Status: DC | PRN
  Administered 2021-03-24: 5 mg via INTRAVENOUS

## 2021-03-24 MED ORDER — ALUM & MAG HYDROXIDE-SIMETH 200-200-20 MG/5ML PO SUSP
30.0000 mL | Freq: Four times a day (QID) | ORAL | Status: DC | PRN
  Administered 2021-03-24: 30 mL via ORAL
  Filled 2021-03-24: qty 30

## 2021-03-24 MED ORDER — ONDANSETRON 4 MG PO TBDP: 4.0000 mg | ORAL_TABLET | Freq: Four times a day (QID) | ORAL | Status: DC | PRN

## 2021-03-24 MED ORDER — KETAMINE HCL 10 MG/ML IJ SOLN
INTRAMUSCULAR | Status: AC
  Filled 2021-03-24: qty 1

## 2021-03-24 MED ORDER — PROPOFOL 10 MG/ML IV BOLUS
INTRAVENOUS | Status: AC
  Filled 2021-03-24: qty 20

## 2021-03-24 MED ORDER — FENTANYL CITRATE (PF) 100 MCG/2ML IJ SOLN
INTRAMUSCULAR | Status: DC | PRN
  Administered 2021-03-24 (×2): 50 ug via INTRAVENOUS

## 2021-03-24 MED ORDER — LACTATED RINGERS IV SOLN: INTRAVENOUS | Status: DC

## 2021-03-24 MED ORDER — SODIUM CHLORIDE (PF) 0.9 % IJ SOLN
INTRAMUSCULAR | Status: AC
  Filled 2021-03-24: qty 10

## 2021-03-24 MED ORDER — BUPIVACAINE-EPINEPHRINE (PF) 0.25% -1:200000 IJ SOLN
INTRAMUSCULAR | Status: AC
  Filled 2021-03-24: qty 30

## 2021-03-24 MED ORDER — 0.9 % SODIUM CHLORIDE (POUR BTL) OPTIME
TOPICAL | Status: DC | PRN
  Administered 2021-03-24: 1000 mL

## 2021-03-24 MED ORDER — OXYCODONE HCL 5 MG PO TABS
5.0000 mg | ORAL_TABLET | ORAL | Status: DC | PRN
  Administered 2021-03-24 – 2021-03-25 (×6): 10 mg via ORAL
  Filled 2021-03-24 (×6): qty 2

## 2021-03-24 MED ORDER — LIDOCAINE HCL (PF) 2 % IJ SOLN
INTRAMUSCULAR | Status: AC
  Filled 2021-03-24: qty 15

## 2021-03-24 MED ORDER — DULOXETINE HCL 60 MG PO CPEP
60.0000 mg | ORAL_CAPSULE | Freq: Every day | ORAL | Status: DC
  Administered 2021-03-25 – 2021-03-26 (×2): 60 mg via ORAL
  Filled 2021-03-24 (×2): qty 1

## 2021-03-24 MED ORDER — SUGAMMADEX SODIUM 200 MG/2ML IV SOLN
INTRAVENOUS | Status: DC | PRN
  Administered 2021-03-24: 200 mg via INTRAVENOUS

## 2021-03-24 MED ORDER — LACTATED RINGERS IV SOLN
INTRAVENOUS | Status: AC | PRN
  Administered 2021-03-24: 1000 mL

## 2021-03-24 MED ORDER — MIDAZOLAM HCL 2 MG/2ML IJ SOLN
INTRAMUSCULAR | Status: AC
  Filled 2021-03-24: qty 2

## 2021-03-24 MED ORDER — BACLOFEN 10 MG PO TABS
10.0000 mg | ORAL_TABLET | Freq: Two times a day (BID) | ORAL | Status: DC
  Administered 2021-03-24 – 2021-03-26 (×4): 10 mg via ORAL
  Filled 2021-03-24 (×4): qty 1

## 2021-03-24 MED ORDER — EPHEDRINE SULFATE-NACL 50-0.9 MG/10ML-% IV SOSY
PREFILLED_SYRINGE | INTRAVENOUS | Status: DC | PRN
  Administered 2021-03-24: 5 mg via INTRAVENOUS

## 2021-03-24 MED ORDER — FENTANYL CITRATE (PF) 100 MCG/2ML IJ SOLN
INTRAMUSCULAR | Status: AC
  Filled 2021-03-24: qty 2

## 2021-03-24 MED ORDER — ROCURONIUM BROMIDE 10 MG/ML (PF) SYRINGE
PREFILLED_SYRINGE | INTRAVENOUS | Status: AC
  Filled 2021-03-24: qty 10

## 2021-03-24 MED ORDER — BUPIVACAINE LIPOSOME 1.3 % IJ SUSP
INTRAMUSCULAR | Status: AC
  Filled 2021-03-24: qty 20

## 2021-03-24 MED ORDER — LIDOCAINE HCL (PF) 2 % IJ SOLN
INTRAMUSCULAR | Status: AC
  Filled 2021-03-24: qty 20

## 2021-03-24 MED ORDER — CIPROFLOXACIN IN D5W 400 MG/200ML IV SOLN
400.0000 mg | Freq: Two times a day (BID) | INTRAVENOUS | Status: AC
  Administered 2021-03-24: 400 mg via INTRAVENOUS
  Filled 2021-03-24: qty 200

## 2021-03-24 MED ORDER — ORAL CARE MOUTH RINSE
15.0000 mL | Freq: Once | OROMUCOSAL | Status: AC
  Administered 2021-03-24: 15 mL via OROMUCOSAL

## 2021-03-24 MED ORDER — AMPHETAMINE-DEXTROAMPHETAMINE 10 MG PO TABS
10.0000 mg | ORAL_TABLET | Freq: Every day | ORAL | Status: DC
  Administered 2021-03-26: 10 mg via ORAL
  Filled 2021-03-24 (×2): qty 1

## 2021-03-24 MED ORDER — LIDOCAINE 2% (20 MG/ML) 5 ML SYRINGE
INTRAMUSCULAR | Status: DC | PRN
  Administered 2021-03-24: 100 mg via INTRAVENOUS

## 2021-03-24 MED ORDER — PROCHLORPERAZINE MALEATE 10 MG PO TABS
10.0000 mg | ORAL_TABLET | Freq: Four times a day (QID) | ORAL | Status: DC | PRN
  Filled 2021-03-24: qty 1

## 2021-03-24 MED ORDER — FENTANYL CITRATE PF 50 MCG/ML IJ SOSY: 25.0000 ug | PREFILLED_SYRINGE | INTRAMUSCULAR | Status: DC | PRN

## 2021-03-24 MED ORDER — PHENYLEPHRINE HCL-NACL 20-0.9 MG/250ML-% IV SOLN
INTRAVENOUS | Status: DC | PRN
  Administered 2021-03-24: 20 ug/min via INTRAVENOUS

## 2021-03-24 MED ORDER — HYDRALAZINE HCL 20 MG/ML IJ SOLN: 10.0000 mg | INTRAMUSCULAR | Status: DC | PRN

## 2021-03-24 MED ORDER — PROCHLORPERAZINE EDISYLATE 10 MG/2ML IJ SOLN: 5.0000 mg | Freq: Four times a day (QID) | INTRAMUSCULAR | Status: DC | PRN

## 2021-03-24 MED ORDER — ONDANSETRON HCL 4 MG/2ML IJ SOLN
4.0000 mg | Freq: Four times a day (QID) | INTRAMUSCULAR | Status: DC | PRN
  Administered 2021-03-25: 4 mg via INTRAVENOUS
  Filled 2021-03-24: qty 2

## 2021-03-24 MED ORDER — PROPOFOL 10 MG/ML IV BOLUS
INTRAVENOUS | Status: DC | PRN
  Administered 2021-03-24: 150 mg via INTRAVENOUS

## 2021-03-24 MED ORDER — OXYCODONE HCL 5 MG/5ML PO SOLN: 5.0000 mg | Freq: Once | ORAL | Status: DC | PRN

## 2021-03-24 MED ORDER — HEPARIN SODIUM (PORCINE) 5000 UNIT/ML IJ SOLN
5000.0000 [IU] | Freq: Three times a day (TID) | INTRAMUSCULAR | Status: DC
  Administered 2021-03-24 – 2021-03-26 (×5): 5000 [IU] via SUBCUTANEOUS
  Filled 2021-03-24 (×5): qty 1

## 2021-03-24 MED ORDER — KETAMINE HCL 10 MG/ML IJ SOLN
INTRAMUSCULAR | Status: DC | PRN
  Administered 2021-03-24: 30 mg via INTRAVENOUS

## 2021-03-24 MED ORDER — KETOROLAC TROMETHAMINE 30 MG/ML IJ SOLN: 30.0000 mg | Freq: Once | INTRAMUSCULAR | Status: DC | PRN

## 2021-03-24 MED ORDER — PHENYLEPHRINE HCL-NACL 20-0.9 MG/250ML-% IV SOLN
INTRAVENOUS | Status: AC
  Filled 2021-03-24: qty 250

## 2021-03-24 MED ORDER — PANTOPRAZOLE SODIUM 40 MG IV SOLR
40.0000 mg | Freq: Every day | INTRAVENOUS | Status: DC
  Administered 2021-03-24: 40 mg via INTRAVENOUS
  Filled 2021-03-24: qty 40

## 2021-03-24 MED ORDER — ONDANSETRON HCL 4 MG/2ML IJ SOLN
INTRAMUSCULAR | Status: AC
  Filled 2021-03-24: qty 2

## 2021-03-24 MED ORDER — DEXAMETHASONE SODIUM PHOSPHATE 10 MG/ML IJ SOLN
INTRAMUSCULAR | Status: AC
  Filled 2021-03-24: qty 1

## 2021-03-24 MED ORDER — CIPROFLOXACIN IN D5W 400 MG/200ML IV SOLN
400.0000 mg | INTRAVENOUS | Status: AC
  Administered 2021-03-24: 400 mg via INTRAVENOUS
  Filled 2021-03-24: qty 200

## 2021-03-24 MED ORDER — SCOPOLAMINE 1 MG/3DAYS TD PT72
1.0000 | MEDICATED_PATCH | TRANSDERMAL | Status: DC
  Administered 2021-03-24: 1.5 mg via TRANSDERMAL
  Filled 2021-03-24: qty 1

## 2021-03-24 MED ORDER — OXYCODONE HCL 5 MG PO TABS: 5.0000 mg | ORAL_TABLET | Freq: Once | ORAL | Status: DC | PRN

## 2021-03-24 MED ORDER — LIDOCAINE HCL (PF) 2 % IJ SOLN
INTRAMUSCULAR | Status: DC | PRN
  Administered 2021-03-24: 1.5 mg/kg/h via INTRADERMAL

## 2021-03-24 MED ORDER — ROCURONIUM BROMIDE 10 MG/ML (PF) SYRINGE
PREFILLED_SYRINGE | INTRAVENOUS | Status: DC | PRN
  Administered 2021-03-24 (×2): 20 mg via INTRAVENOUS
  Administered 2021-03-24: 80 mg via INTRAVENOUS

## 2021-03-24 MED ORDER — DEXMEDETOMIDINE (PRECEDEX) IN NS 20 MCG/5ML (4 MCG/ML) IV SYRINGE
PREFILLED_SYRINGE | INTRAVENOUS | Status: DC | PRN
  Administered 2021-03-24: 8 ug via INTRAVENOUS

## 2021-03-24 MED ORDER — ONDANSETRON HCL 4 MG/2ML IJ SOLN
INTRAMUSCULAR | Status: DC | PRN
  Administered 2021-03-24: 4 mg via INTRAVENOUS

## 2021-03-24 MED ORDER — FENTANYL CITRATE PF 50 MCG/ML IJ SOSY: 12.5000 ug | PREFILLED_SYRINGE | INTRAMUSCULAR | Status: DC | PRN

## 2021-03-24 MED ORDER — AMLODIPINE BESYLATE 5 MG PO TABS
5.0000 mg | ORAL_TABLET | Freq: Every day | ORAL | Status: DC
  Administered 2021-03-25 – 2021-03-26 (×2): 5 mg via ORAL
  Filled 2021-03-24 (×2): qty 1

## 2021-03-24 MED ORDER — BUPIVACAINE LIPOSOME 1.3 % IJ SUSP
INTRAMUSCULAR | Status: DC | PRN
  Administered 2021-03-24: 20 mL

## 2021-03-24 MED ORDER — SUCCINYLCHOLINE CHLORIDE 200 MG/10ML IV SOSY
PREFILLED_SYRINGE | INTRAVENOUS | Status: DC | PRN
  Administered 2021-03-24: 120 mg via INTRAVENOUS

## 2021-03-24 MED ORDER — BUPIVACAINE LIPOSOME 1.3 % IJ SUSP: 20.0000 mL | Freq: Once | INTRAMUSCULAR | Status: DC

## 2021-03-24 MED ORDER — CHLORHEXIDINE GLUCONATE 0.12 % MT SOLN: 15.0000 mL | Freq: Once | OROMUCOSAL | Status: AC

## 2021-03-24 SURGICAL SUPPLY — 68 items
APPLIER CLIP 5 13 M/L LIGAMAX5 (MISCELLANEOUS)
APPLIER CLIP ROT 13.4 12 LRG (CLIP)
BLADE SURG 15 STRL LF DISP TIS (BLADE) ×2 IMPLANT
BLADE SURG 15 STRL SS (BLADE) ×2
CLIP APPLIE 5 13 M/L LIGAMAX5 (MISCELLANEOUS) IMPLANT
CLIP APPLIE ROT 13.4 12 LRG (CLIP) IMPLANT
COVER SURGICAL LIGHT HANDLE (MISCELLANEOUS) ×4 IMPLANT
COVER TIP SHEARS 8 DVNC (MISCELLANEOUS) ×2 IMPLANT
COVER TIP SHEARS 8MM DA VINCI (MISCELLANEOUS) ×2
DECANTER SPIKE VIAL GLASS SM (MISCELLANEOUS) ×4 IMPLANT
DERMABOND ADVANCED (GAUZE/BANDAGES/DRESSINGS) ×2
DERMABOND ADVANCED .7 DNX12 (GAUZE/BANDAGES/DRESSINGS) ×2 IMPLANT
DEVICE TROCAR PUNCTURE CLOSURE (ENDOMECHANICALS) IMPLANT
DRAIN PENROSE 0.5X18 (DRAIN) ×4 IMPLANT
DRAPE ARM DVNC X/XI (DISPOSABLE) ×8 IMPLANT
DRAPE COLUMN DVNC XI (DISPOSABLE) ×2 IMPLANT
DRAPE DA VINCI XI ARM (DISPOSABLE) ×8
DRAPE DA VINCI XI COLUMN (DISPOSABLE) ×2
ELECT REM PT RETURN 15FT ADLT (MISCELLANEOUS) ×8 IMPLANT
GAUZE 4X4 16PLY ~~LOC~~+RFID DBL (SPONGE) ×4 IMPLANT
GLOVE SURG ENC MOIS LTX SZ7 (GLOVE) ×4 IMPLANT
GLOVE SURG ENC TEXT LTX SZ8 (GLOVE) ×8 IMPLANT
GLOVE SURG POLYISO LF SZ6 (GLOVE) ×4 IMPLANT
GLOVE SURG POLYISO LF SZ6.5 (GLOVE) ×4 IMPLANT
GLOVE SURG POLYISO LF SZ7 (GLOVE) ×8 IMPLANT
GLOVE SURG UNDER POLY LF SZ6.5 (GLOVE) ×4 IMPLANT
GLOVE SURG UNDER POLY LF SZ7 (GLOVE) ×8 IMPLANT
GOWN STRL REUS W/TWL LRG LVL3 (GOWN DISPOSABLE) ×12 IMPLANT
GOWN STRL REUS W/TWL XL LVL3 (GOWN DISPOSABLE) ×8 IMPLANT
GRASPER SUT TROCAR 14GX15 (MISCELLANEOUS) IMPLANT
IRRIG SUCT STRYKERFLOW 2 WTIP (MISCELLANEOUS) ×4
IRRIGATION SUCT STRKRFLW 2 WTP (MISCELLANEOUS) ×2 IMPLANT
KIT BASIN OR (CUSTOM PROCEDURE TRAY) ×4 IMPLANT
KIT TURNOVER KIT A (KITS) IMPLANT
MARKER SKIN DUAL TIP RULER LAB (MISCELLANEOUS) ×4 IMPLANT
NEEDLE HYPO 22GX1.5 SAFETY (NEEDLE) ×4 IMPLANT
OBTURATOR OPTICAL STANDARD 8MM (TROCAR) ×2
OBTURATOR OPTICAL STND 8 DVNC (TROCAR) ×2
OBTURATOR OPTICALSTD 8 DVNC (TROCAR) ×2 IMPLANT
PACK CARDIOVASCULAR III (CUSTOM PROCEDURE TRAY) ×4 IMPLANT
PAD POSITIONING PINK XL (MISCELLANEOUS) IMPLANT
SCISSORS LAP 5X45 EPIX DISP (ENDOMECHANICALS) IMPLANT
SEAL CANN UNIV 5-8 DVNC XI (MISCELLANEOUS) ×8 IMPLANT
SEAL XI 5MM-8MM UNIVERSAL (MISCELLANEOUS) ×8
SEALER VESSEL DA VINCI XI (MISCELLANEOUS) ×2
SEALER VESSEL EXT DVNC XI (MISCELLANEOUS) ×2 IMPLANT
SOL ANTI FOG 6CC (MISCELLANEOUS) ×2 IMPLANT
SOLUTION ANTI FOG 6CC (MISCELLANEOUS) ×2
SOLUTION ELECTROLUBE (MISCELLANEOUS) ×4 IMPLANT
SUT ETHIBOND 0 36 GRN (SUTURE) ×16 IMPLANT
SUT MNCRL AB 4-0 PS2 18 (SUTURE) ×8 IMPLANT
SUT VICRYL 0 UR6 27IN ABS (SUTURE) IMPLANT
SYR 10ML ECCENTRIC (SYRINGE) ×4 IMPLANT
SYR 20ML LL LF (SYRINGE) ×4 IMPLANT
TIP INNERVISION DETACH 40FR (MISCELLANEOUS) IMPLANT
TIP INNERVISION DETACH 50FR (MISCELLANEOUS) ×4 IMPLANT
TIP INNERVISION DETACH 56FR (MISCELLANEOUS) IMPLANT
TIPS INNERVISION DETACH 40FR (MISCELLANEOUS)
TOWEL OR 17X26 10 PK STRL BLUE (TOWEL DISPOSABLE) ×4 IMPLANT
TRAY FOLEY MTR SLVR 14FR STAT (SET/KITS/TRAYS/PACK) ×4 IMPLANT
TRAY FOLEY MTR SLVR 16FR STAT (SET/KITS/TRAYS/PACK) IMPLANT
TROCAR ADV FIXATION 12X100MM (TROCAR) ×4 IMPLANT
TROCAR ADV FIXATION 5X100MM (TROCAR) IMPLANT
TROCAR BLADELESS OPT 5 100 (ENDOMECHANICALS) ×4 IMPLANT
TUBING CONNECTING 10 (TUBING) ×3 IMPLANT
TUBING CONNECTING 10' (TUBING) ×1
TUBING ENDO SMARTCAP (MISCELLANEOUS) ×4 IMPLANT
TUBING INSUFFLATION 10FT LAP (TUBING) ×4 IMPLANT

## 2021-03-24 NOTE — Anesthesia Preprocedure Evaluation (Signed)
Anesthesia Evaluation  Patient identified by MRN, date of birth, ID band Patient awake    Reviewed: Allergy & Precautions, NPO status , Patient's Chart, lab work & pertinent test results  Airway Mallampati: II  TM Distance: <3 FB Neck ROM: Full    Dental no notable dental hx.    Pulmonary neg pulmonary ROS, former smoker,    Pulmonary exam normal breath sounds clear to auscultation       Cardiovascular hypertension, Pt. on medications Normal cardiovascular exam Rhythm:Regular Rate:Normal     Neuro/Psych MS  Neuromuscular disease negative psych ROS   GI/Hepatic Neg liver ROS, GERD  ,  Endo/Other  negative endocrine ROS  Renal/GU negative Renal ROS  negative genitourinary   Musculoskeletal negative musculoskeletal ROS (+)   Abdominal   Peds negative pediatric ROS (+)  Hematology negative hematology ROS (+)   Anesthesia Other Findings   Reproductive/Obstetrics negative OB ROS                             Anesthesia Physical Anesthesia Plan  ASA: 2  Anesthesia Plan: General   Post-op Pain Management:    Induction: Intravenous  PONV Risk Score and Plan: 3 and Ondansetron, Dexamethasone and Treatment may vary due to age or medical condition  Airway Management Planned: Oral ETT  Additional Equipment:   Intra-op Plan:   Post-operative Plan: Extubation in OR  Informed Consent: I have reviewed the patients History and Physical, chart, labs and discussed the procedure including the risks, benefits and alternatives for the proposed anesthesia with the patient or authorized representative who has indicated his/her understanding and acceptance.     Dental advisory given  Plan Discussed with: CRNA and Surgeon  Anesthesia Plan Comments:         Anesthesia Quick Evaluation

## 2021-03-24 NOTE — Transfer of Care (Signed)
Immediate Anesthesia Transfer of Care Note  Patient: Natalie Campos  Procedure(s) Performed: XI ROBOTIC ASSISTED HIATAL HERNIA REPAIR WITH FUNDOPLICATION (Abdomen) ESOPHAGOGASTRODUODENOSCOPY (EGD)  Patient Location: PACU  Anesthesia Type:General  Level of Consciousness: awake  Airway & Oxygen Therapy: Patient Spontanous Breathing and Patient connected to face mask oxygen  Post-op Assessment: Report given to RN and Post -op Vital signs reviewed and stable  Post vital signs: Reviewed and stable  Last Vitals:  Vitals Value Taken Time  BP    Temp    Pulse    Resp    SpO2      Last Pain:  Vitals:   03/24/21 0534  TempSrc: Oral         Complications: No notable events documented.

## 2021-03-24 NOTE — Anesthesia Postprocedure Evaluation (Signed)
Anesthesia Post Note  Patient: LATEEFA CROSBY  Procedure(s) Performed: XI ROBOTIC ASSISTED HIATAL HERNIA REPAIR WITH FUNDOPLICATION (Abdomen) ESOPHAGOGASTRODUODENOSCOPY (EGD)     Patient location during evaluation: PACU Anesthesia Type: General Level of consciousness: awake and alert Pain management: pain level controlled Vital Signs Assessment: post-procedure vital signs reviewed and stable Respiratory status: spontaneous breathing, nonlabored ventilation, respiratory function stable and patient connected to nasal cannula oxygen Cardiovascular status: blood pressure returned to baseline and stable Postop Assessment: no apparent nausea or vomiting Anesthetic complications: no   No notable events documented.  Last Vitals:  Vitals:   03/24/21 1200 03/24/21 1218  BP: (!) 149/83 (!) 155/92  Pulse: 85 84  Resp: (!) 21 16  Temp: 36.8 C 36.4 C  SpO2: 94% 96%    Last Pain:  Vitals:   03/24/21 1218  TempSrc:   PainSc: 9                  Shunna Mikaelian S

## 2021-03-24 NOTE — Anesthesia Procedure Notes (Signed)
Procedure Name: Intubation Date/Time: 03/24/2021 7:30 AM Performed by: Claudia Desanctis, CRNA Pre-anesthesia Checklist: Patient identified, Emergency Drugs available, Suction available and Patient being monitored Patient Re-evaluated:Patient Re-evaluated prior to induction Oxygen Delivery Method: Circle system utilized Preoxygenation: Pre-oxygenation with 100% oxygen Induction Type: IV induction Ventilation: Mask ventilation without difficulty Laryngoscope Size: 2 and Miller Grade View: Grade I Tube type: Oral Tube size: 7.0 mm Number of attempts: 1 Airway Equipment and Method: Stylet Placement Confirmation: ETT inserted through vocal cords under direct vision, positive ETCO2 and breath sounds checked- equal and bilateral Secured at: 21 cm Tube secured with: Tape Dental Injury: Teeth and Oropharynx as per pre-operative assessment

## 2021-03-24 NOTE — Interval H&P Note (Signed)
History and Physical Interval Note:  03/24/2021 7:19 AM  Natalie Campos  has presented today for surgery, with the diagnosis of hiatal hernia.  The various methods of treatment have been discussed with the patient and family. After consideration of risks, benefits and other options for treatment, the patient has consented to  Procedure(s): XI ROBOTIC ASSISTED HIATAL HERNIA REPAIR WITH FUNDOPLICATION (N/A) as a surgical intervention.  The patient's history has been reviewed, patient examined, no change in status, stable for surgery.  I have reviewed the patient's chart and labs.  Questions were answered to the patient's satisfaction.     Pedro Earls

## 2021-03-24 NOTE — Op Note (Signed)
Natalie Campos  1961/02/23   03/24/2021    PCP:  Kathyrn Drown, MD   Surgeon: Kaylyn Lim, MD, FACS  Asst:  Gurney Maxin, MD, FACS  Anes:  general  Preop Dx: Large type III hiatal hernia with Lysbeth Galas ulcers and hx of anemia Postop Dx: same  Procedure: Xi Robotic repair of hiatal hernia with 2 figure of 8s and 2 simples; upper endoscopy to check anatomy, Nissen fundoplication over #44 lighted bougie Location Surgery: [facility] OR 2 Complications: None noted  EBL:   minimal cc  Drains: none  Description of Procedure:  The patient was taken to OR 2 .  After anesthesia was administered and the patient was prepped  with chloroprep  and a timeout was performed.  Access was achieved with a 5 mm Optiview through the left upper quadrant.  The four 8 mm robotic trocars were placed including the changeout of the 5 and a Sherrie Sport was placed in the upper midline.  The robot was docked from the right and the camera targeted.    A large type III mixed hiatal hernia was noted and the sacs were incised and stripped out en toto.  There was a sac on the right and on the left.  The posterior attachments were taken down and more lateral attachments were taken down.  Penrose was placed around the esophagus.  Endoscopy showed no retained food in the esophagus,  the esophagus appeared to be at length in abdomen.  No mucosal injuries were noted.  The hiatus was repaired with four braided ethibond with the posterior two with figure of 8 and then two simple sutures.  The last was placed with the 50 bougie in place.    A 360 wrap was performed by grasping the stomach along an upper short gastric and pulling it around.  Three sutures of Ethibond were placed incorporating periesophageal tissue.  The bougie was removed and the robot undocked.  The incisions were closed with 4-0 Monocryl and Dermabond.    The patient tolerated the procedure well and was taken to the PACU in stable condition.     Matt B. Hassell Done,  [place], [facility], [place]

## 2021-03-25 ENCOUNTER — Encounter (HOSPITAL_COMMUNITY): Payer: Self-pay | Admitting: Surgery

## 2021-03-25 LAB — CBC
HCT: 38.8 % (ref 36.0–46.0)
Hemoglobin: 13.4 g/dL (ref 12.0–15.0)
MCH: 31.6 pg (ref 26.0–34.0)
MCHC: 34.5 g/dL (ref 30.0–36.0)
MCV: 91.5 fL (ref 80.0–100.0)
Platelets: 232 10*3/uL (ref 150–400)
RBC: 4.24 MIL/uL (ref 3.87–5.11)
RDW: 12.7 % (ref 11.5–15.5)
WBC: 8.8 10*3/uL (ref 4.0–10.5)
nRBC: 0 % (ref 0.0–0.2)

## 2021-03-25 MED ORDER — PANTOPRAZOLE SODIUM 40 MG PO TBEC
40.0000 mg | DELAYED_RELEASE_TABLET | Freq: Every day | ORAL | Status: DC
  Administered 2021-03-25: 40 mg via ORAL
  Filled 2021-03-25: qty 1

## 2021-03-25 NOTE — Progress Notes (Signed)
Patient ID: Natalie Campos, female   DOB: 02/28/1961, 60 y.o.   MRN: [id number] [facility] Progress Note:   1 Day Post-Op  Subjective: Mental status is clear.  Complaints sore in shoulder. Objective: Vital signs in last 24 hours: Temp:  [97.7 F (36.5 C)-98.5 F (36.9 C)] 97.9 F (36.6 C) (11/15 1000) Pulse Rate:  [77-95] 77 (11/15 1000) Resp:  [14-18] 17 (11/15 1000) BP: (126-152)/(73-94) 132/83 (11/15 1000) SpO2:  [91 %-98 %] 98 % (11/15 1000)  Intake/Output from previous day: 11/14 0701 - 11/15 0700 In: 3728.5 [P.O.:600; I.V.:2928.5; IV Piggyback:200] Out: 1450 [Urine:1375; Blood:75] Intake/Output this shift: Total I/O In: 0  Out: 700 [Urine:700]  Physical Exam: Work of breathing is not labored.  Some pneumatosis in face  Lab Results:  Results for orders placed or performed during the hospital encounter of 03/24/21 (from the past 48 hour(s))  CBC     Status: Abnormal   Collection Time: 03/24/21 12:52 PM  Result Value Ref Range   WBC 14.7 (H) 4.0 - 10.5 K/uL   RBC 4.64 3.87 - 5.11 MIL/uL   Hemoglobin 14.6 12.0 - 15.0 g/dL   HCT 42.9 36.0 - 46.0 %   MCV 92.5 80.0 - 100.0 fL   MCH 31.5 26.0 - 34.0 pg   MCHC 34.0 30.0 - 36.0 g/dL   RDW 12.7 11.5 - 15.5 %   Platelets 224 150 - 400 K/uL   nRBC 0.0 0.0 - 0.2 %    Comment: Performed at [facility], [place] [address] [place], [place] [address]  Creatinine, serum     Status: None   Collection Time: 03/24/21 12:52 PM  Result Value Ref Range   Creatinine, Ser 0.88 0.44 - 1.00 mg/dL   GFR, Estimated >60 >60 mL/min    Comment: (NOTE) Calculated using the CKD-EPI Creatinine Equation (2021) Performed at [facility], [place] [address] [place], [place] [address]   CBC     Status: None   Collection Time: 03/25/21  4:19 AM  Result Value Ref Range   WBC 8.8 4.0 - 10.5 K/uL   RBC 4.24 3.87 - 5.11 MIL/uL   Hemoglobin 13.4 12.0 - 15.0 g/dL   HCT 38.8 36.0 - 46.0 %   MCV 91.5 80.0 -  100.0 fL   MCH 31.6 26.0 - 34.0 pg   MCHC 34.5 30.0 - 36.0 g/dL   RDW 12.7 11.5 - 15.5 %   Platelets 232 150 - 400 K/uL   nRBC 0.0 0.0 - 0.2 %    Comment: Performed at [facility], [place] [address] [place], [place] [address]    Radiology/Results: No results found.  Anti-infectives: Anti-infectives (From admission, onward)    Start     Dose/Rate Route Frequency Ordered Stop   03/24/21 2000  ciprofloxacin (CIPRO) IVPB 400 mg        400 mg 200 mL/hr over 60 Minutes Intravenous Every 12 hours 03/24/21 1220 03/24/21 2149   03/24/21 0600  ciprofloxacin (CIPRO) IVPB 400 mg        400 mg 200 mL/hr over 60 Minutes Intravenous On call to O.R. 03/24/21 0520 03/24/21 0746       Assessment/Plan: Problem List: Patient Active Problem List   Diagnosis Date Noted   Status post laparoscopic Nissen fundoplication 46/96/2952   Barrett's esophagus 01/17/2019   Hiatal hernia 08/12/2017   Diarrhea 09/11/2015   GERD (gastroesophageal reflux disease) 09/11/2015   Anemia 08/01/2013   Vertigo 10/16/2011  Allergic reaction 10/16/2011   FATIGUE 07/01/2010   CAROTID BRUIT 02/27/2010   OBESITY, UNSPECIFIED 06/24/2009   ACUTE SINUSITIS, UNSPECIFIED 06/24/2009   HEADACHE 06/24/2009   Hyperlipidemia 05/23/2009   DEPRESSION 05/23/2009   MULTIPLE SCLEROSIS 05/23/2009   Essential hypertension 05/23/2009   EMPHYSEMA 05/23/2009   GERD 05/23/2009   IRRITABLE BOWEL SYNDROME 05/23/2009   ECZEMA 05/23/2009   DEGENERATIVE [place] DISEASE 05/23/2009    Taking clears slowly.  Try to advance and hopeful discharge in the morning.   1 Day Post-Op    LOS: 1 day   Matt B. Hassell Done, MD, [facility] [phone] to reach the surgeon on call.    03/25/2021 1:51 PM

## 2021-03-26 MED ORDER — HYDROCODONE-ACETAMINOPHEN 5-325 MG PO TABS: 1.0000 | ORAL_TABLET | Freq: Four times a day (QID) | ORAL | 0 refills | Status: DC | PRN

## 2021-03-26 MED ORDER — ONDANSETRON 4 MG PO TBDP: 4.0000 mg | ORAL_TABLET | Freq: Three times a day (TID) | ORAL | 0 refills | Status: DC | PRN

## 2021-03-26 NOTE — Discharge Summary (Signed)
Physician Discharge Summary  Patient ID: Natalie Campos MRN: [id number] DOB/AGE: [birth year] 60 y.o.  PCP: Kathyrn Drown, MD  Admit date: 03/24/2021 Discharge date: 03/26/2021  Admission Diagnoses:  hiatal hernia with GER  Discharge Diagnoses:  same  Active Problems:   Status post laparoscopic Nissen fundoplication   Surgery:  Xi robotic repair of diaphragm and Nissen fundoplication  Discharged Condition: improved  Hospital Course:   Had surgery on Monday.  Slower to drink and progress but ready for discharge on Wednesday.  Incisions OK  Consults: none  Significant Diagnostic Studies: none    Discharge Exam: Blood pressure 128/90, pulse 84, temperature 98.6 F (37 C), temperature source Oral, resp. rate 18, height 5\' 9"  (1.753 m), weight 104 kg, SpO2 91 %. Incisions healing OK  Disposition: Discharge disposition: 01-Home or Self Care       Discharge Instructions     Call MD for:  redness, tenderness, or signs of infection (pain, swelling, redness, odor or green/yellow discharge around incision site)   Complete by: As directed    Diet full liquid   Complete by: As directed    Full liquids for one week then pureed food for 3 weeks.   Discharge instructions   Complete by: As directed    May shower ad lib.  Dermabond will peel off   Increase activity slowly   Complete by: As directed       Allergies as of 03/26/2021       Reactions   Ace Inhibitors Anaphylaxis   Atenolol Anaphylaxis   Losartan Swelling   Severe tongue swelling   Penicillins    Childhood Allergy - caused coma  Has patient had a PCN reaction causing immediate rash, facial/tongue/throat swelling, SOB or lightheadedness with hypotension: No Has patient had a PCN reaction causing severe rash involving mucus membranes or skin necrosis: No Has patient had a PCN reaction that required hospitalization: No Has patient had a PCN reaction occurring within the last 10 years: No If all of the above  answers are "NO", then may proceed with Cephalosporin use.        Medication List     STOP taking these medications    traMADol 50 MG tablet Commonly known as: ULTRAM       TAKE these medications    Advil PM 200-38 MG Tabs Generic drug: Ibuprofen-diphenhydrAMINE Cit Take 1-2 tablets by mouth at bedtime as needed (pain/sleep).   amLODipine 5 MG tablet Commonly known as: NORVASC TAKE 1 TABLET BY MOUTH  DAILY   amphetamine-dextroamphetamine 10 MG tablet Commonly known as: ADDERALL Take 10 mg by mouth daily.   baclofen 10 MG tablet Commonly known as: LIORESAL Take 10 mg by mouth 2 (two) times daily.   diazepam 10 MG tablet Commonly known as: VALIUM Take 10 mg by mouth in the morning, at noon, and at bedtime.   docusate sodium 100 MG capsule Commonly known as: COLACE Take 100 mg by mouth 2 (two) times daily.   DULoxetine 60 MG capsule Commonly known as: CYMBALTA Take 60 mg by mouth daily.   famotidine 20 MG tablet Commonly known as: PEPCID TAKE 1 TABLET BY MOUTH AT  BEDTIME   ferrous sulfate 325 (65 FE) MG tablet Commonly known as: FerrouSul Take 1 tablet (325 mg total) by mouth daily with breakfast.   fish oil-omega-3 fatty acids 1000 MG capsule Take 1 g by mouth daily.   gabapentin 600 MG tablet Commonly known as: NEURONTIN Take 600 mg by mouth 3 (  three) times daily.   hydrochlorothiazide 12.5 MG capsule Commonly known as: MICROZIDE Take one tablet po daily   HYDROcodone-acetaminophen 5-325 MG tablet Commonly known as: NORCO/VICODIN Take 1 tablet by mouth every 6 (six) hours as needed for moderate pain.   HYDROmorphone 4 MG tablet Commonly known as: DILAUDID Take 4 mg by mouth every 12 (twelve) hours as needed for severe pain.   multivitamin per tablet Take 1 tablet by mouth daily.   OCREVUS IV Inject into the vein. Infused every 6 months.   omeprazole 40 MG capsule Commonly known as: PRILOSEC Take 1 capsule (40 mg total) by mouth daily  before supper.   ondansetron 4 MG disintegrating tablet Commonly known as: Zofran ODT Take 1 tablet (4 mg total) by mouth every 8 (eight) hours as needed for nausea or vomiting.   oxybutynin 5 MG tablet Commonly known as: DITROPAN Take 5 mg by mouth 3 (three) times daily.   polyethylene glycol powder 17 GM/SCOOP powder Commonly known as: GLYCOLAX/MIRALAX MIX 25.5 GRAMS WITH WATER OR JUICE ONCE DAILY.   potassium chloride 10 MEQ tablet Commonly known as: KLOR-CON Take one tablet po 3 times per day What changed:  how much to take how to take this when to take this additional instructions   topiramate 100 MG tablet Commonly known as: TOPAMAX Take 100 mg by mouth daily.   Vitamin D (Ergocalciferol) 1.25 MG (50000 UNIT) Caps capsule Commonly known as: DRISDOL Take 50,000 Units by mouth every 7 (seven) days.         Signed: Pedro Earls 03/26/2021, 6:43 AM

## 2021-03-26 NOTE — Plan of Care (Signed)

## 2021-03-27 ENCOUNTER — Encounter (HOSPITAL_COMMUNITY): Payer: Medicare Other

## 2021-04-09 ENCOUNTER — Encounter (HOSPITAL_COMMUNITY): Admission: RE | Admit: 2021-04-09 | Payer: Medicare Other | Source: Ambulatory Visit

## 2021-04-10 ENCOUNTER — Encounter (HOSPITAL_COMMUNITY): Admission: RE | Admit: 2021-04-10 | Payer: Medicare Other | Source: Ambulatory Visit

## 2021-04-15 ENCOUNTER — Encounter (HOSPITAL_COMMUNITY)
Admission: RE | Admit: 2021-04-15 | Discharge: 2021-04-15 | Disposition: A | Discharge status: Home / Self Care | Payer: Medicare Other | Source: Ambulatory Visit | Attending: Neurology | Admitting: Neurology

## 2021-04-15 ENCOUNTER — Other Ambulatory Visit: Payer: Self-pay

## 2021-04-15 DIAGNOSIS — G35 Multiple sclerosis: Secondary | ICD-10-CM | POA: Diagnosis not present

## 2021-04-15 MED ORDER — ACETAMINOPHEN 325 MG PO TABS
650.0000 mg | ORAL_TABLET | Freq: Once | ORAL | Status: AC
  Administered 2021-04-15: 650 mg via ORAL
  Filled 2021-04-15: qty 2

## 2021-04-15 MED ORDER — SODIUM CHLORIDE 0.9 % IV SOLN: INTRAVENOUS | Status: DC

## 2021-04-15 MED ORDER — SODIUM CHLORIDE 0.9 % IV SOLN
600.0000 mg | Freq: Once | INTRAVENOUS | Status: AC
  Administered 2021-04-15: 600 mg via INTRAVENOUS
  Filled 2021-04-15: qty 20

## 2021-04-15 MED ORDER — SODIUM CHLORIDE 0.9 % IV SOLN
500.0000 mg | Freq: Once | INTRAVENOUS | Status: AC
  Administered 2021-04-15: 500 mg via INTRAVENOUS
  Filled 2021-04-15: qty 4

## 2021-04-15 MED ORDER — DIPHENHYDRAMINE HCL 50 MG/ML IJ SOLN
50.0000 mg | Freq: Once | INTRAMUSCULAR | Status: AC
  Administered 2021-04-15: 50 mg via INTRAVENOUS
  Filled 2021-04-15: qty 1

## 2021-05-13 DIAGNOSIS — J029 Acute pharyngitis, unspecified: Secondary | ICD-10-CM | POA: Diagnosis not present

## 2021-05-13 DIAGNOSIS — B370 Candidal stomatitis: Secondary | ICD-10-CM | POA: Diagnosis not present

## 2021-05-13 DIAGNOSIS — R059 Cough, unspecified: Secondary | ICD-10-CM | POA: Diagnosis not present

## 2021-05-13 DIAGNOSIS — Z7982 Long term (current) use of aspirin: Secondary | ICD-10-CM | POA: Diagnosis not present

## 2021-05-13 DIAGNOSIS — K219 Gastro-esophageal reflux disease without esophagitis: Secondary | ICD-10-CM | POA: Diagnosis not present

## 2021-05-13 DIAGNOSIS — Z79899 Other long term (current) drug therapy: Secondary | ICD-10-CM | POA: Diagnosis not present

## 2021-05-13 DIAGNOSIS — Z800 Family history of malignant neoplasm of digestive organs: Secondary | ICD-10-CM | POA: Diagnosis not present

## 2021-05-13 DIAGNOSIS — K143 Hypertrophy of tongue papillae: Secondary | ICD-10-CM | POA: Diagnosis not present

## 2021-05-13 DIAGNOSIS — I10 Essential (primary) hypertension: Secondary | ICD-10-CM | POA: Diagnosis not present

## 2021-05-13 DIAGNOSIS — E785 Hyperlipidemia, unspecified: Secondary | ICD-10-CM | POA: Diagnosis not present

## 2021-05-22 ENCOUNTER — Telehealth (INDEPENDENT_AMBULATORY_CARE_PROVIDER_SITE_OTHER): Payer: Self-pay | Admitting: *Deleted

## 2021-05-22 ENCOUNTER — Other Ambulatory Visit (INDEPENDENT_AMBULATORY_CARE_PROVIDER_SITE_OTHER): Payer: Self-pay | Admitting: *Deleted

## 2021-05-22 MED ORDER — POLYETHYLENE GLYCOL 3350 17 GM/SCOOP PO POWD: ORAL | 5 refills | Status: DC

## 2021-05-22 NOTE — Telephone Encounter (Signed)
[facility] for 6 refills per dr Laural Golden. Refills sent to new pharm and pt was notified.

## 2021-05-22 NOTE — Telephone Encounter (Signed)
Refill on miralax - mix 25.5 grams with water or juice once daily 510.  Grams Changing pharm from [facility] [place]  Last seen 01/14/21

## 2021-05-23 ENCOUNTER — Other Ambulatory Visit (HOSPITAL_COMMUNITY): Payer: Self-pay | Admitting: Surgery

## 2021-05-23 ENCOUNTER — Other Ambulatory Visit: Payer: Self-pay | Admitting: Surgery

## 2021-05-23 DIAGNOSIS — Z8719 Personal history of other diseases of the digestive system: Secondary | ICD-10-CM

## 2021-05-30 ENCOUNTER — Other Ambulatory Visit: Payer: Self-pay

## 2021-05-30 ENCOUNTER — Ambulatory Visit (HOSPITAL_COMMUNITY)
Admission: RE | Admit: 2021-05-30 | Discharge: 2021-05-30 | Disposition: A | Discharge status: Home / Self Care | Payer: Medicare Other | Source: Ambulatory Visit | Attending: Surgery | Admitting: Surgery

## 2021-05-30 DIAGNOSIS — K449 Diaphragmatic hernia without obstruction or gangrene: Secondary | ICD-10-CM | POA: Diagnosis not present

## 2021-05-30 DIAGNOSIS — Z9889 Other specified postprocedural states: Secondary | ICD-10-CM | POA: Insufficient documentation

## 2021-05-30 DIAGNOSIS — Z8719 Personal history of other diseases of the digestive system: Secondary | ICD-10-CM | POA: Insufficient documentation

## 2021-05-30 DIAGNOSIS — K219 Gastro-esophageal reflux disease without esophagitis: Secondary | ICD-10-CM | POA: Diagnosis not present

## 2021-06-23 DIAGNOSIS — G894 Chronic pain syndrome: Secondary | ICD-10-CM | POA: Diagnosis not present

## 2021-07-07 ENCOUNTER — Other Ambulatory Visit: Payer: Self-pay | Admitting: Neurology

## 2021-07-07 ENCOUNTER — Other Ambulatory Visit (HOSPITAL_COMMUNITY): Payer: Self-pay | Admitting: Neurology

## 2021-07-07 DIAGNOSIS — E876 Hypokalemia: Secondary | ICD-10-CM | POA: Diagnosis not present

## 2021-07-07 DIAGNOSIS — G35 Multiple sclerosis: Secondary | ICD-10-CM

## 2021-07-07 DIAGNOSIS — M5459 Other low back pain: Secondary | ICD-10-CM | POA: Diagnosis not present

## 2021-07-07 DIAGNOSIS — R5382 Chronic fatigue, unspecified: Secondary | ICD-10-CM | POA: Diagnosis not present

## 2021-07-07 DIAGNOSIS — Z79899 Other long term (current) drug therapy: Secondary | ICD-10-CM | POA: Diagnosis not present

## 2021-07-07 DIAGNOSIS — G4733 Obstructive sleep apnea (adult) (pediatric): Secondary | ICD-10-CM | POA: Diagnosis not present

## 2021-07-07 DIAGNOSIS — E559 Vitamin D deficiency, unspecified: Secondary | ICD-10-CM | POA: Diagnosis not present

## 2021-07-07 DIAGNOSIS — R251 Tremor, unspecified: Secondary | ICD-10-CM | POA: Diagnosis not present

## 2021-07-08 LAB — BASIC METABOLIC PANEL
BUN/Creatinine Ratio: 13 (ref 12–28)
BUN: 11 mg/dL (ref 8–27)
CO2: 24 mmol/L (ref 20–29)
Calcium: 9.5 mg/dL (ref 8.7–10.3)
Chloride: 103 mmol/L (ref 96–106)
Creatinine, Ser: 0.84 mg/dL (ref 0.57–1.00)
Glucose: 80 mg/dL (ref 70–99)
Potassium: 3.3 mmol/L — ABNORMAL LOW (ref 3.5–5.2)
Sodium: 143 mmol/L (ref 134–144)
eGFR: 79 mL/min/{1.73_m2} (ref >59)

## 2021-07-09 MED ORDER — POTASSIUM CHLORIDE ER 10 MEQ PO TBCR: EXTENDED_RELEASE_TABLET | ORAL | 0 refills | Status: DC

## 2021-07-09 NOTE — Progress Notes (Signed)
Patient has been informed per provider result notes and recommendations, pt verbalizes understanding.   ?

## 2021-07-09 NOTE — Addendum Note (Signed)
Addended by: Dairl Ponder on: 07/09/2021 11:37 AM ? ? Modules accepted: Orders ? ?

## 2021-07-14 ENCOUNTER — Ambulatory Visit (INDEPENDENT_AMBULATORY_CARE_PROVIDER_SITE_OTHER): Payer: Medicare Other | Admitting: Family Medicine

## 2021-07-14 ENCOUNTER — Other Ambulatory Visit: Payer: Self-pay

## 2021-07-14 VITALS — BP 114/78 | Temp 98.1°F | Ht 69.0 in | Wt 212.4 lb

## 2021-07-14 DIAGNOSIS — I10 Essential (primary) hypertension: Secondary | ICD-10-CM

## 2021-07-14 DIAGNOSIS — E876 Hypokalemia: Secondary | ICD-10-CM | POA: Diagnosis not present

## 2021-07-14 DIAGNOSIS — E7849 Other hyperlipidemia: Secondary | ICD-10-CM | POA: Diagnosis not present

## 2021-07-14 DIAGNOSIS — R5383 Other fatigue: Secondary | ICD-10-CM | POA: Diagnosis not present

## 2021-07-14 DIAGNOSIS — G35 Multiple sclerosis: Secondary | ICD-10-CM

## 2021-07-14 MED ORDER — POTASSIUM CHLORIDE ER 10 MEQ PO TBCR: EXTENDED_RELEASE_TABLET | ORAL | 0 refills | Status: DC

## 2021-07-14 MED ORDER — HYDROCHLOROTHIAZIDE 12.5 MG PO CAPS: ORAL_CAPSULE | ORAL | 3 refills | Status: DC

## 2021-07-14 MED ORDER — AMLODIPINE BESYLATE 5 MG PO TABS: 5.0000 mg | ORAL_TABLET | Freq: Every day | ORAL | 1 refills | Status: DC

## 2021-07-14 MED ORDER — PANTOPRAZOLE SODIUM 40 MG PO TBEC: 40.0000 mg | DELAYED_RELEASE_TABLET | Freq: Every day | ORAL | 3 refills | Status: DC

## 2021-07-14 NOTE — Progress Notes (Signed)
? ?  Subjective:  ? ? Patient ID: Natalie Campos, female    DOB: 07/13/1960, 61 y.o.   MRN: [id number] ? ?HPI ?Other fatigue - Plan: TSH, T4, free, Lipid Profile, Basic Metabolic Panel (BMET) ? ?Hypokalemia - Plan: potassium chloride (KLOR-CON) 10 MEQ tablet, TSH, T4, free, Lipid Profile, Basic Metabolic Panel (BMET) ? ?Multiple sclerosis ([place]), Chronic - Plan: TSH, T4, free, Lipid Profile, Basic Metabolic Panel (BMET) ? ?Essential hypertension ? ?Other hyperlipidemia ?Patient relates a lot of fatigue tiredness not feeling good.  Number she relates this to her MS ?She is taking medications as directed but is not currently on steroids because her specialist felt that those could impede her healing from her surgical site. ? ?She does have MS flareup going on because of weakness in her arms and legs.  Sees specialist.  Has upcoming MRI on the 15th. ? ?History of hypokalemia takes her potassium regular basis. ? ?History hypertension blood pressure under good control today tries to eat relatively healthy ? ?Hyperlipidemia check lipid profile tries to eat healthy ? ? ?Review of Systems ? ?   ?Objective:  ? Physical Exam ? ?General-in no acute distress ?Eyes-no discharge ?Lungs-respiratory rate normal, CTA ?CV-no murmurs,RRR ?Extremities skin warm dry no edema ?Neuro grossly normal ?Behavior normal, alert ? ? ? ?   ?Assessment & Plan:  ?1. Hypokalemia ?Recent hypokalemia continue potassium supplement recheck potassium ?- potassium chloride (KLOR-CON) 10 MEQ tablet; Take one tablet po 2 times per day  Dispense: 180 tablet; Refill: 0 ?- TSH ?- T4, free ?- Lipid Profile ?- Basic Metabolic Panel (BMET) ? ?2. Other fatigue ?Significant fatigue probably related to the MS but we will check thyroid function ?- TSH ?- T4, free ?- Lipid Profile ?- Basic Metabolic Panel (BMET) ? ?3. Multiple sclerosis ([place]) ?MS under current flareup her specialist does not want to put her on any type of steroids currently because of her previous  intestinal surgery ?- TSH ?- T4, free ?- Lipid Profile ?- Basic Metabolic Panel (BMET) ? ?4. Essential hypertension ?Blood pressure good control continue current measures ? ?5. Other hyperlipidemia ?Healthy diet recommended repeat lipid profile ? ?Follow-up 3 months ? ?

## 2021-07-15 ENCOUNTER — Ambulatory Visit (INDEPENDENT_AMBULATORY_CARE_PROVIDER_SITE_OTHER): Payer: Medicare Other | Admitting: Internal Medicine

## 2021-07-23 ENCOUNTER — Other Ambulatory Visit: Payer: Self-pay

## 2021-07-23 ENCOUNTER — Ambulatory Visit (HOSPITAL_COMMUNITY)
Admission: RE | Admit: 2021-07-23 | Discharge: 2021-07-23 | Disposition: A | Discharge status: Home / Self Care | Payer: Medicare Other | Source: Ambulatory Visit | Attending: Neurology | Admitting: Neurology

## 2021-07-23 DIAGNOSIS — G35 Multiple sclerosis: Secondary | ICD-10-CM

## 2021-07-23 DIAGNOSIS — M47812 Spondylosis without myelopathy or radiculopathy, cervical region: Secondary | ICD-10-CM | POA: Diagnosis not present

## 2021-07-23 MED ORDER — GADOBUTROL 1 MMOL/ML IV SOLN
10.0000 mL | Freq: Once | INTRAVENOUS | Status: AC | PRN
  Administered 2021-07-23: 10 mL via INTRAVENOUS

## 2021-07-28 ENCOUNTER — Other Ambulatory Visit (INDEPENDENT_AMBULATORY_CARE_PROVIDER_SITE_OTHER): Payer: Self-pay | Admitting: Internal Medicine

## 2021-07-28 NOTE — Telephone Encounter (Signed)
Patient states surgeon told her to take omeprazole '40mg'$  one and primary told her to add protonix '40mg'$  once daily.  ?

## 2021-07-28 NOTE — Telephone Encounter (Signed)
Patient was given Omeprazole by [place] and recently on 07/14/2021 Dr. Sallee Lange prescribed Pantoprazole. I left a message asked that she please return call to see which medication she is using.  ?

## 2021-07-29 DIAGNOSIS — Z1231 Encounter for screening mammogram for malignant neoplasm of breast: Secondary | ICD-10-CM | POA: Diagnosis not present

## 2021-07-29 NOTE — Telephone Encounter (Signed)
Omeparzole script has two different directions in her chart one daily and one bid. Pt told me she has been taking one bid taking omeprazole one bid. Was last sent in for #90. Pt wants to change quantity to #180.  ?

## 2021-07-30 NOTE — Telephone Encounter (Signed)
Patient states she had surgery for hiatal hernia and the band slipped and when she was just taking omeprazole bid it was not controlling her symptoms. She would wake at night with burning in throat. Since pcp added protonix she has been doing well. Her surgery was back in November and she goes back to surgeon this Friday. She tried carafate but it was expensive $160 and did not see where it helped that much.  ?

## 2021-07-31 ENCOUNTER — Other Ambulatory Visit (INDEPENDENT_AMBULATORY_CARE_PROVIDER_SITE_OTHER): Payer: Self-pay | Admitting: *Deleted

## 2021-07-31 MED ORDER — PANTOPRAZOLE SODIUM 40 MG PO TBEC: 40.0000 mg | DELAYED_RELEASE_TABLET | Freq: Two times a day (BID) | ORAL | 1 refills | Status: DC

## 2021-07-31 NOTE — Telephone Encounter (Signed)
Per dr Laural Golden may take protonix bid and stop omeprazole. New rx sent to pharm and discussed with pt. Pt to call back if issues.  ?

## 2021-08-20 DIAGNOSIS — I739 Peripheral vascular disease, unspecified: Secondary | ICD-10-CM | POA: Diagnosis not present

## 2021-08-20 DIAGNOSIS — L110 Acquired keratosis follicularis: Secondary | ICD-10-CM | POA: Diagnosis not present

## 2021-08-20 DIAGNOSIS — L609 Nail disorder, unspecified: Secondary | ICD-10-CM | POA: Diagnosis not present

## 2021-08-20 DIAGNOSIS — M79672 Pain in left foot: Secondary | ICD-10-CM | POA: Diagnosis not present

## 2021-08-20 DIAGNOSIS — M79675 Pain in left toe(s): Secondary | ICD-10-CM | POA: Diagnosis not present

## 2021-08-20 DIAGNOSIS — M79671 Pain in right foot: Secondary | ICD-10-CM | POA: Diagnosis not present

## 2021-08-20 DIAGNOSIS — M79674 Pain in right toe(s): Secondary | ICD-10-CM | POA: Diagnosis not present

## 2021-09-04 ENCOUNTER — Telehealth: Payer: Self-pay | Admitting: Pharmacy Technician

## 2021-09-04 ENCOUNTER — Other Ambulatory Visit: Payer: Self-pay | Admitting: Pharmacy Technician

## 2021-09-04 NOTE — Telephone Encounter (Signed)
Auth Submission: [facility] PAP - FREE DRUG - OCREVUS 600 MG ? ?Payer: PAP ?Medication & CPT/J Code(s) submitted: Ocrevus Hoyt Koch) [phone] ?Route of submission (phone, fax, portal): PHONE: [phone] ?ID# [id number] ?APPROVAL DATE: 08/19/17  ?Will remain active until [place] is no longer needed, insurance changes, or patient no longer meets criteria. ?Pharmacy: [facility]: [phone] ?

## 2021-09-22 DIAGNOSIS — Z79899 Other long term (current) drug therapy: Secondary | ICD-10-CM | POA: Diagnosis not present

## 2021-09-22 DIAGNOSIS — G35 Multiple sclerosis: Secondary | ICD-10-CM | POA: Diagnosis not present

## 2021-09-22 DIAGNOSIS — R262 Difficulty in walking, not elsewhere classified: Secondary | ICD-10-CM | POA: Diagnosis not present

## 2021-09-22 DIAGNOSIS — R5381 Other malaise: Secondary | ICD-10-CM | POA: Diagnosis not present

## 2021-09-22 DIAGNOSIS — G894 Chronic pain syndrome: Secondary | ICD-10-CM | POA: Diagnosis not present

## 2021-09-22 DIAGNOSIS — I10 Essential (primary) hypertension: Secondary | ICD-10-CM | POA: Diagnosis not present

## 2021-10-02 ENCOUNTER — Ambulatory Visit (INDEPENDENT_AMBULATORY_CARE_PROVIDER_SITE_OTHER): Payer: Medicare Other | Admitting: Family Medicine

## 2021-10-02 ENCOUNTER — Telehealth: Payer: Self-pay

## 2021-10-02 ENCOUNTER — Encounter: Payer: Self-pay | Admitting: Family Medicine

## 2021-10-02 VITALS — BP 138/87 | HR 95 | Temp 97.8°F | Ht 69.0 in | Wt 203.0 lb

## 2021-10-02 DIAGNOSIS — J019 Acute sinusitis, unspecified: Secondary | ICD-10-CM | POA: Diagnosis not present

## 2021-10-02 MED ORDER — OLOPATADINE HCL 0.1 % OP SOLN: 1.0000 [drp] | Freq: Two times a day (BID) | OPHTHALMIC | 12 refills | Status: DC

## 2021-10-02 MED ORDER — DOXYCYCLINE HYCLATE 100 MG PO TABS: 100.0000 mg | ORAL_TABLET | Freq: Two times a day (BID) | ORAL | 0 refills | Status: DC

## 2021-10-02 NOTE — Telephone Encounter (Signed)
Pt contacted. Pt made aware that insurance will not pay for Patanol eye drops due to them being over the counter. Pt having swollen eyes, congestion and sinus headache for about 2 weeks. OTC medications not helping. Advised pt that we would need to see her due to symptoms before sending in anything. Pt verbalized understanding and has been placed on schedule for this afternoon

## 2021-10-02 NOTE — Progress Notes (Signed)
   Subjective:    Patient ID: Natalie Campos, female    DOB: [birth year], 61 y.o.   MRN: [id number]  Sinusitis The current episode started 1 to 4 weeks ago. Associated symptoms include congestion, coughing, headaches and sinus pressure. Treatments tried: cough medicine as well as sinus medicine.  Conjunctivitis  The current episode started 5 to 7 days ago. Associated symptoms include congestion, headaches, cough, eye discharge and eye redness.     Review of Systems  HENT:  Positive for congestion and sinus pressure.   Eyes:  Positive for discharge and redness.  Respiratory:  Positive for cough.   Neurological:  Positive for headaches.      Objective:   Physical Exam Gen-NAD not toxic TMS-normal bilateral T- normal no redness Chest-CTA respiratory rate normal no crackles CV RRR no murmur Skin-warm dry Neuro-grossly normal       Assessment & Plan:  Acute rhinosinusitis Antibiotics prescribed Should gradually get better No sign of pneumonia Warning signs discussed

## 2021-10-02 NOTE — Telephone Encounter (Signed)
Pt called in stating that she is having some severe allergy issues. Pt states that she has a lot of congestion, sinus pressure, and both eyes are very swollen. Pt stated that she has been trying otc meds to help, but she does't have anything that will help her with eyes. Pt wanted to know if pcp would call in some Patanol drops for eyes. Please advise.  Cb#: [phone]

## 2021-10-14 ENCOUNTER — Encounter (HOSPITAL_COMMUNITY): Payer: Medicare Other

## 2021-10-14 ENCOUNTER — Ambulatory Visit (INDEPENDENT_AMBULATORY_CARE_PROVIDER_SITE_OTHER): Payer: Medicare Other

## 2021-10-14 VITALS — BP 136/83 | HR 73 | Temp 98.0°F | Resp 18 | Ht 69.0 in | Wt 205.8 lb

## 2021-10-14 DIAGNOSIS — G35 Multiple sclerosis: Secondary | ICD-10-CM | POA: Diagnosis not present

## 2021-10-14 MED ORDER — SODIUM CHLORIDE 0.9 % IV SOLN
600.0000 mg | Freq: Once | INTRAVENOUS | Status: AC
  Administered 2021-10-14: 600 mg via INTRAVENOUS
  Filled 2021-10-14: qty 20

## 2021-10-14 MED ORDER — DIPHENHYDRAMINE HCL 25 MG PO CAPS
50.0000 mg | ORAL_CAPSULE | Freq: Once | ORAL | Status: AC
  Administered 2021-10-14: 50 mg via ORAL
  Filled 2021-10-14: qty 2

## 2021-10-14 MED ORDER — METHYLPREDNISOLONE SODIUM SUCC 125 MG IJ SOLR
125.0000 mg | Freq: Once | INTRAMUSCULAR | Status: AC
  Administered 2021-10-14: 125 mg via INTRAVENOUS
  Filled 2021-10-14: qty 2

## 2021-10-14 MED ORDER — ACETAMINOPHEN 325 MG PO TABS
650.0000 mg | ORAL_TABLET | Freq: Once | ORAL | Status: AC
  Administered 2021-10-14: 650 mg via ORAL
  Filled 2021-10-14: qty 2

## 2021-10-14 NOTE — Progress Notes (Signed)
Diagnosis: Multiple Sclerosis  Provider:  Marshell Garfinkel, MD  Procedure: Infusion  IV Type: Peripheral, IV Location: L Forearm  Ocrevus (Ocrelizumab), Dose: 600  Infusion Start Time: 4076  Infusion Stop Time: 8088  Post Infusion IV Care: Patient declined observation and Peripheral IV Discontinued  Discharge: Condition: Good, Destination: Home . AVS provided to patient.   Performed by:  Cleophus Molt, RN

## 2021-10-21 ENCOUNTER — Encounter (INDEPENDENT_AMBULATORY_CARE_PROVIDER_SITE_OTHER): Payer: Self-pay | Admitting: Internal Medicine

## 2021-10-21 ENCOUNTER — Ambulatory Visit (INDEPENDENT_AMBULATORY_CARE_PROVIDER_SITE_OTHER): Payer: Medicare Other | Admitting: Internal Medicine

## 2021-10-21 VITALS — BP 126/86 | HR 106 | Temp 99.4°F | Ht 69.0 in | Wt 207.1 lb

## 2021-10-21 DIAGNOSIS — K219 Gastro-esophageal reflux disease without esophagitis: Secondary | ICD-10-CM

## 2021-10-21 DIAGNOSIS — K5900 Constipation, unspecified: Secondary | ICD-10-CM | POA: Diagnosis not present

## 2021-10-21 MED ORDER — ESOMEPRAZOLE MAGNESIUM 40 MG PO CPDR: 40.0000 mg | DELAYED_RELEASE_CAPSULE | Freq: Two times a day (BID) | ORAL | 1 refills | Status: DC

## 2021-10-21 MED ORDER — SUCRALFATE 1 G PO TABS: 1.0000 g | ORAL_TABLET | Freq: Four times a day (QID) | ORAL | 2 refills | Status: DC

## 2021-10-21 NOTE — Progress Notes (Signed)
Presenting complaint;  Follow-up for chronic GERD. Constipation.  Database and subjective:  Patient is 61 year old Caucasian female who has chronic GERD complicated by short segment Barrett's esophagus as well as history of IBS/constipation who is here for scheduled visit. Natalie Campos Barrett's esophagus is concerned she was diagnosed in January 2011 when she was found to have 3 small patches of salmon-colored mucosa.  Biopsy revealed Barrett's mucosa with low-grade dysplasia.   Follow-up EGD 6 months later revealed Barrett's without dysplasia.  EGD in April 2015 revealed Barrett's without dysplasia. EGD and October 2022 performed for worsening GERD symptoms revealed short segment Barrett's but biopsies did not confirm this diagnosis.  She also had 6 cm size sliding hiatal hernia.  Hernia was noted to have increased in size since her previous exam and there was also suggestion of organoaxial rotation.  Please note the report states 5 cm Barrett's esophagus which is not corrected should have been 5 mm.  This finding was confirmed with upper GI series performed on 02/24/2021 revealing large sliding hiatal hernia with organoaxial position/volvulus.  This study also suggested esophageal dysmotility. She underwent robotic repair of hiatal hernia and Nissen fundoplication over 50 French bougie on 03/24/2021. Patient continued to complain of dysphagia regurgitation and heartburn.  She had upper GI series on 05/30/2021 by Dr. Johnathan Hausen.  Upper GI series revealed recurrence of hiatal hernia but it was much smaller than prior to repair.  She also had GE reflux.  Patient reports mild symptomatic improvement since her surgery.  She is having heartburn at least 2-3 times a week.  She has dysphagia with bread pasta and meat.  She has limited these foods in her diet.  She has intermittent food impaction.  She had an episode 2 weeks ago relieved with regurgitation.  She states sucralfate is helping with the  heartburn but suspension causes $600 a month.  She has not been treated for bronchitis or pneumonia.  She has lost 5 pounds since her last visit of September 2022.  Her bowels remain irregular.  She can go 7 to 10 days and not have a bowel movement and then she will have explosive liquid stools until she becomes constipated.  She states on few occasions she had 2 digitally disimpact herself.  She has chronic pain and takes Dilaudid 4 mg p.o. twice daily.  She denies melena or rectal bleeding. Her last colonoscopy was in April 2015  Current Medications: Outpatient Encounter Medications as of 10/21/2021  Medication Sig   amLODipine (NORVASC) 5 MG tablet Take 1 tablet (5 mg total) by mouth daily.   amphetamine-dextroamphetamine (ADDERALL) 10 MG tablet Take 10 mg by mouth daily.   baclofen (LIORESAL) 10 MG tablet Take 10 mg by mouth 2 (two) times daily.    diazepam (VALIUM) 10 MG tablet Take 10 mg by mouth in the morning, at noon, and at bedtime.   docusate sodium (COLACE) 100 MG capsule Take 100 mg by mouth 2 (two) times daily.   DULoxetine (CYMBALTA) 60 MG capsule Take 60 mg by mouth daily.    ferrous sulfate (FERROUSUL) 325 (65 FE) MG tablet Take 1 tablet (325 mg total) by mouth daily with breakfast.   fish oil-omega-3 fatty acids 1000 MG capsule Take 1 g by mouth daily.   gabapentin (NEURONTIN) 600 MG tablet Take 600 mg by mouth 3 (three) times daily.    hydrochlorothiazide (MICROZIDE) 12.5 MG capsule Take one tablet po daily   HYDROmorphone (DILAUDID) 4 MG tablet Take 4 mg by mouth every  12 (twelve) hours as needed for severe pain.   modafinil (PROVIGIL) 200 MG tablet Take 200-400 mg by mouth every morning.   multivitamin (THERAGRAN) per tablet Take 1 tablet by mouth daily.   Ocrelizumab (OCREVUS IV) Inject into the vein. Infused every 6 months.   olopatadine (PATANOL) 0.1 % ophthalmic solution Place 1 drop into both eyes 2 (two) times daily.   oxybutynin (DITROPAN) 5 MG tablet Take 5 mg by  mouth 3 (three) times daily.   pantoprazole (PROTONIX) 40 MG tablet Take 1 tablet (40 mg total) by mouth 2 (two) times daily.   polyethylene glycol powder (GLYCOLAX/MIRALAX) 17 GM/SCOOP powder MIX 25.5 GRAMS WITH WATER OR JUICE ONCE DAILY.   potassium chloride (KLOR-CON) 10 MEQ tablet Take one tablet po 2 times per day   sucralfate (CARAFATE) 1 GM/10ML suspension Take by mouth.   topiramate (TOPAMAX) 100 MG tablet Take 100 mg by mouth daily.   traMADol (ULTRAM) 50 MG tablet Take 100 mg by mouth every 6 (six) hours.   Vitamin D, Ergocalciferol, (DRISDOL) 1.25 MG (50000 UNIT) CAPS capsule Take 50,000 Units by mouth every 7 (seven) days.   ondansetron (ZOFRAN ODT) 4 MG disintegrating tablet Take 1 tablet (4 mg total) by mouth every 8 (eight) hours as needed for nausea or vomiting. (Patient not taking: Reported on 07/14/2021)   [DISCONTINUED] doxycycline (VIBRA-TABS) 100 MG tablet Take 1 tablet (100 mg total) by mouth 2 (two) times daily.   [DISCONTINUED] HYDROcodone-acetaminophen (NORCO/VICODIN) 5-325 MG tablet Take 1 tablet by mouth every 6 (six) hours as needed for moderate pain.   [DISCONTINUED] Ibuprofen-diphenhydrAMINE Cit (ADVIL PM) 200-38 MG TABS Take 1-2 tablets by mouth at bedtime as needed (pain/sleep). (Patient not taking: Reported on 07/14/2021)   Facility-Administered Encounter Medications as of 10/21/2021  Medication   Influenza (>/= 3 years) inactive virus vaccine (FLVIRIN/FLUZONE) injection SUSP 0.5 mL     Objective: Blood pressure 126/86, pulse (!) 106, temperature 99.4 F (37.4 C), temperature source Oral, height '5\' 9"'  (1.753 m), weight 207 lb 1.6 oz (93.9 kg). Patient is alert and in no acute distress. Conjunctiva is pink. Sclera is nonicteric Oropharyngeal mucosa is normal. No neck masses or thyromegaly noted. Cardiac exam with regular rhythm normal S1 and S2. No murmur or gallop noted. Lungs are clear to auscultation. Abdomen is full.  On palpation is soft with mild  midepigastric tenderness.  No organomegaly or masses. No LE edema or clubbing noted.  Labs/studies Results:      Latest Ref Rng & Units 03/25/2021    4:19 AM 03/24/2021   12:52 PM 03/21/2021    9:26 AM  CBC  WBC 4.0 - 10.5 K/uL 8.8  14.7  7.9   Hemoglobin 12.0 - 15.0 g/dL 13.4  14.6  15.7   Hematocrit 36.0 - 46.0 % 38.8  42.9  45.9   Platelets 150 - 400 K/uL 232  224  263        Latest Ref Rng & Units 07/07/2021   10:28 AM 03/24/2021   12:52 PM 03/21/2021    9:26 AM  CMP  Glucose 70 - 99 mg/dL 80   130   BUN 8 - 27 mg/dL 11   13   Creatinine 0.57 - 1.00 mg/dL 0.84  0.88  1.00   Sodium 134 - 144 mmol/L 143   138   Potassium 3.5 - 5.2 mmol/L 3.3   3.1   Chloride 96 - 106 mmol/L 103   104   CO2 20 - 29 mmol/L  24   25   Calcium 8.7 - 10.3 mg/dL 9.5   9.4        Latest Ref Rng & Units 03/04/2020    2:46 PM 06/28/2018    3:06 PM 03/25/2018   11:18 AM  Hepatic Function  Total Protein 6.0 - 8.5 g/dL 7.6  7.4  7.2   Albumin 3.8 - 4.8 g/dL 4.8  4.6  3.9   AST 0 - 40 IU/L '17  17  20   ' ALT 0 - 32 IU/L '14  14  16   ' Alk Phosphatase 44 - 121 IU/L 114  140  110   Total Bilirubin 0.0 - 1.2 mg/dL <0.2  0.3  0.8   Bilirubin, Direct 0.00 - 0.40 mg/dL  0.09      Upper GI series reviewed with Dr. Janeece Fitting. Upper GI series from January 2023 revealed recurrence of small sliding hiatal hernia.   Assessment:  #1.  Chronic GERD.  Patient has had GERD symptoms for many years.  Evaluation in October last year revealed enlargement of hernia and not really that she also had organoaxial rotation.  She underwent robotic assisted repair in March 24, 2021 but she remains symptomatic.  I suspect she also has underlying esophageal motility disorder which may also be contributing to her symptoms.  Not sure if she is a candidate for repeat surgery.  Patient has an appointment to see Dr. Johnathan Hausen next week. It is also possible that PPI has run its course and she needs to be changed to another  agent.  She can use sucralfate tablets instead of suspension.  Tablets will be cost effective.  #2.  History of short segment Barrett's esophagus.  Was diagnosed in January 2011.  EGD in October last year was negative for biopsy.  This could be sampling error or that Barrett's has been eradicated with multiple biopsies in the past.  We will consider EGD in October 2027.  #3.  IBS with constipation and diarrhea.  Patient has to make sure that she has at least 3 bowel movements per week.  She should not allow herself to go whole week without a bowel movement as it would make her GERD symptoms worse.  #4.  Patient is average risk for colon carcinoma.  Last colonoscopy was in April 2015 and next exam would be in April 2025.   Plan:  Discontinue pantoprazole and sucralfate liquid. Begin esomeprazole 40 mg by mouth 30 minutes before breakfast and evening meal. Sucralfate tablet 1 g p.o. before meals and nightly. Patient advised to use Dulcolax or glycerin suppository if she goes more than 2 days without a bowel movement. She should have at least 3 bowel movements per week if not more. Follow-up in 8 weeks.

## 2021-10-21 NOTE — Patient Instructions (Addendum)
Discontinue pantoprazole and sucralfate solution. Nexium/esomeprazole 40 mg by mouth 30 minutes before breakfast and evening meal daily. Sucralfate 1 g by mouth 30 to 60 minutes before each meal and at bedtime.  Try to take at least 3 doses a day. We will review upper GI series with radiologist and let she know. Can use Dulcolax or glycerin suppository on as-needed basis.  Goal is for you to have at least 3 bowel movements per week.

## 2021-11-11 ENCOUNTER — Other Ambulatory Visit: Payer: Self-pay | Admitting: Family Medicine

## 2021-11-11 DIAGNOSIS — E876 Hypokalemia: Secondary | ICD-10-CM

## 2021-11-13 ENCOUNTER — Ambulatory Visit: Payer: Medicare Other | Admitting: Family Medicine

## 2021-11-13 ENCOUNTER — Ambulatory Visit (INDEPENDENT_AMBULATORY_CARE_PROVIDER_SITE_OTHER): Payer: Medicare Other | Admitting: Nurse Practitioner

## 2021-11-13 ENCOUNTER — Ambulatory Visit: Payer: Self-pay | Admitting: Family Medicine

## 2021-11-13 VITALS — BP 119/87 | HR 103 | Temp 98.4°F | Ht 69.0 in | Wt 202.0 lb

## 2021-11-13 DIAGNOSIS — M5441 Lumbago with sciatica, right side: Secondary | ICD-10-CM | POA: Diagnosis not present

## 2021-11-13 DIAGNOSIS — G35 Multiple sclerosis: Secondary | ICD-10-CM | POA: Diagnosis not present

## 2021-11-13 DIAGNOSIS — E876 Hypokalemia: Secondary | ICD-10-CM | POA: Diagnosis not present

## 2021-11-13 DIAGNOSIS — R5383 Other fatigue: Secondary | ICD-10-CM | POA: Diagnosis not present

## 2021-11-13 DIAGNOSIS — E785 Hyperlipidemia, unspecified: Secondary | ICD-10-CM | POA: Diagnosis not present

## 2021-11-13 MED ORDER — TRIAMCINOLONE ACETONIDE 40 MG/ML IJ SUSP
80.0000 mg | Freq: Once | INTRAMUSCULAR | Status: AC
  Administered 2021-11-13: 80 mg via INTRAMUSCULAR

## 2021-11-13 NOTE — Progress Notes (Signed)
   Subjective:    Patient ID: Natalie Campos, female    DOB: 11/28/1960, 61 y.o.   MRN: [id number]  HPI  61 year old female patient with history of MS, degenerative joint disease, bulging disc, presents to clinic today with complaints of a pulled back muscle x2 to 3 weeks.  Patient states that she pulled her back muscle across back while cleaning.  Patient states that she sometimes will feel a shooting pain down her right leg.  Patient denies any new numbness tingling or weakness to her legs, or difficulty urinating.  Patient is prescribed Dilaudid, tramadol, Valium, Cymbalta and baclofen and states that none of those medications have been able to help her back pain.  Patient requesting a steroid shot as that has been helpful for her in the past.    Review of Systems  Musculoskeletal:  Positive for back pain.  All other systems reviewed and are negative.      Objective:   Physical Exam Vitals reviewed.  Constitutional:      General: She is not in acute distress.    Appearance: Normal appearance. She is obese. She is not ill-appearing, toxic-appearing or diaphoretic.  HENT:     Head: Normocephalic and atraumatic.  Cardiovascular:     Rate and Rhythm: Normal rate and regular rhythm.     Pulses: Normal pulses.     Heart sounds: Normal heart sounds. No murmur heard. Pulmonary:     Effort: Pulmonary effort is normal. No respiratory distress.     Breath sounds: Normal breath sounds. No wheezing.  Musculoskeletal:     Comments: Ambulates with walker.  Bilateral lower extremity strength appears to be at baseline.  Point tenderness to right side of lumbar back.  Skin:    General: Skin is warm.     Capillary Refill: Capillary refill takes less than 2 seconds.  Neurological:     Mental Status: She is alert.     Comments: Grossly intact  Psychiatric:        Mood and Affect: Mood normal.        Behavior: Behavior normal.        Assessment & Plan:   1. Acute right-sided low back pain  with right-sided sciatica -Patient treated with Kenalog 80 mg in office -Side effects of steroid injections discussed in detail - triamcinolone acetonide (KENALOG-40) injection 80 mg -Return to clinic if symptoms do not improve or if they worsen.    Note:  This document was prepared using Dragon voice recognition software and may include unintentional dictation errors. Note - This record has been created using [employer].  Chart creation errors have been sought, but may not always  have been located. Such creation errors do not reflect on  the standard of medical care.

## 2021-11-14 ENCOUNTER — Encounter: Payer: Self-pay | Admitting: Nurse Practitioner

## 2021-11-14 LAB — BASIC METABOLIC PANEL
BUN/Creatinine Ratio: 8 — ABNORMAL LOW (ref 12–28)
BUN: 9 mg/dL (ref 8–27)
CO2: 25 mmol/L (ref 20–29)
Calcium: 10.2 mg/dL (ref 8.7–10.3)
Chloride: 100 mmol/L (ref 96–106)
Creatinine, Ser: 1.06 mg/dL — ABNORMAL HIGH (ref 0.57–1.00)
Glucose: 114 mg/dL — ABNORMAL HIGH (ref 70–99)
Potassium: 4.1 mmol/L (ref 3.5–5.2)
Sodium: 143 mmol/L (ref 134–144)
eGFR: 59 mL/min/{1.73_m2} — ABNORMAL LOW (ref >59)

## 2021-11-14 LAB — LIPID PANEL
Chol/HDL Ratio: 2.6 ratio (ref 0.0–4.4)
Cholesterol, Total: 241 mg/dL — ABNORMAL HIGH (ref 100–199)
HDL: 92 mg/dL (ref >39)
LDL Chol Calc (NIH): 131 mg/dL — ABNORMAL HIGH (ref 0–99)
Triglycerides: 108 mg/dL (ref 0–149)
VLDL Cholesterol Cal: 18 mg/dL (ref 5–40)

## 2021-11-14 LAB — TSH: TSH: 1.53 u[IU]/mL (ref 0.450–4.500)

## 2021-11-14 LAB — T4, FREE: Free T4: 1.34 ng/dL (ref 0.82–1.77)

## 2021-11-27 DIAGNOSIS — K130 Diseases of lips: Secondary | ICD-10-CM | POA: Diagnosis not present

## 2021-11-27 DIAGNOSIS — L304 Erythema intertrigo: Secondary | ICD-10-CM | POA: Diagnosis not present

## 2021-11-27 DIAGNOSIS — L400 Psoriasis vulgaris: Secondary | ICD-10-CM | POA: Diagnosis not present

## 2021-11-27 DIAGNOSIS — L821 Other seborrheic keratosis: Secondary | ICD-10-CM | POA: Diagnosis not present

## 2021-12-18 ENCOUNTER — Other Ambulatory Visit: Payer: Self-pay | Admitting: Family Medicine

## 2021-12-22 ENCOUNTER — Other Ambulatory Visit (INDEPENDENT_AMBULATORY_CARE_PROVIDER_SITE_OTHER): Payer: Self-pay | Admitting: Internal Medicine

## 2021-12-22 DIAGNOSIS — R262 Difficulty in walking, not elsewhere classified: Secondary | ICD-10-CM | POA: Diagnosis not present

## 2021-12-22 DIAGNOSIS — R5381 Other malaise: Secondary | ICD-10-CM | POA: Diagnosis not present

## 2021-12-22 DIAGNOSIS — G35 Multiple sclerosis: Secondary | ICD-10-CM | POA: Diagnosis not present

## 2021-12-22 DIAGNOSIS — Z79899 Other long term (current) drug therapy: Secondary | ICD-10-CM | POA: Diagnosis not present

## 2021-12-22 DIAGNOSIS — G894 Chronic pain syndrome: Secondary | ICD-10-CM | POA: Diagnosis not present

## 2021-12-22 NOTE — Telephone Encounter (Signed)
Last visit June 2023 by Dr. Laural Golden

## 2021-12-25 ENCOUNTER — Ambulatory Visit: Payer: Medicare Other | Admitting: Family Medicine

## 2021-12-29 ENCOUNTER — Other Ambulatory Visit (INDEPENDENT_AMBULATORY_CARE_PROVIDER_SITE_OTHER): Payer: Self-pay | Admitting: Internal Medicine

## 2022-01-05 DIAGNOSIS — H26493 Other secondary cataract, bilateral: Secondary | ICD-10-CM | POA: Diagnosis not present

## 2022-01-05 DIAGNOSIS — Z961 Presence of intraocular lens: Secondary | ICD-10-CM | POA: Diagnosis not present

## 2022-01-19 ENCOUNTER — Ambulatory Visit (INDEPENDENT_AMBULATORY_CARE_PROVIDER_SITE_OTHER): Payer: Medicare Other | Admitting: Family Medicine

## 2022-01-19 VITALS — BP 126/86 | Ht 69.0 in | Wt 211.8 lb

## 2022-01-19 DIAGNOSIS — E7849 Other hyperlipidemia: Secondary | ICD-10-CM | POA: Diagnosis not present

## 2022-01-19 DIAGNOSIS — G35D Multiple sclerosis, unspecified: Secondary | ICD-10-CM

## 2022-01-19 DIAGNOSIS — T7840XA Allergy, unspecified, initial encounter: Secondary | ICD-10-CM

## 2022-01-19 DIAGNOSIS — R7989 Other specified abnormal findings of blood chemistry: Secondary | ICD-10-CM | POA: Diagnosis not present

## 2022-01-19 DIAGNOSIS — K220 Achalasia of cardia: Secondary | ICD-10-CM | POA: Diagnosis not present

## 2022-01-19 DIAGNOSIS — T783XXA Angioneurotic edema, initial encounter: Secondary | ICD-10-CM

## 2022-01-19 DIAGNOSIS — G35 Multiple sclerosis: Secondary | ICD-10-CM

## 2022-01-19 DIAGNOSIS — I10 Essential (primary) hypertension: Secondary | ICD-10-CM

## 2022-01-19 DIAGNOSIS — Z23 Encounter for immunization: Secondary | ICD-10-CM

## 2022-01-19 DIAGNOSIS — R1110 Vomiting, unspecified: Secondary | ICD-10-CM

## 2022-01-19 DIAGNOSIS — R7301 Impaired fasting glucose: Secondary | ICD-10-CM

## 2022-01-19 NOTE — Progress Notes (Signed)
   Subjective:    Patient ID: Natalie Campos, female    DOB: 1961/04/29, 61 y.o.   MRN: [id number]  Hypertension This is a chronic problem. The current episode started more than 1 year ago. Treatments tried: norvasc, HCTZ. There are no compliance problems.    Needs refill of Norvasc to [facility]  Patient states she has had problems with lip swelling since yesterday- using benadryl   Review of Systems     Objective:   Physical Exam  General-in no acute distress Eyes-no discharge Lungs-respiratory rate normal, CTA CV-no murmurs,RRR Extremities skin warm dry no edema Neuro grossly normal Behavior normal, alert The 10-year ASCVD risk score (Arnett DK, et al., 2019) is: 4.5%   Values used to calculate the score:     Age: 83 years     Sex: Female     Is Non-Hispanic African American: No     Diabetic: No     Tobacco smoker: No     Systolic Blood Pressure: 694 mmHg     Is BP treated: Yes     HDL Cholesterol: 92 mg/dL     Total Cholesterol: 241 mg/dL       Assessment & Plan:   1. Regurgitation of food Patient relates that when she eats often she has regurgitation after eating and states she often has not at nighttime I am courage her to sleep on an incline 30 degrees at the minimum. Also patient had upper GI earlier this year which showed that part of the stomach was above the fundoplication.  She will be seeing the surgeon to see what he has to say about this versus possible second opinion.  Images were reviewed with the patient.  The amount of stomach above the fundoplication is small but causing the patient problems She will have to weigh the benefit versus risk of further surgery  2. Elevated serum creatinine Lab work within the next 2 to 4 weeks middle of the day when well-hydrated - Basic metabolic panel - Microalbumin / creatinine urine ratio  3. Fasting hyperglycemia Check A1c to make sure patient not becoming diabetic - Hemoglobin A1c  4. Achalasia There is a  possibility that she has achalasia as part of the problem she will be seeing Natalie Campos in the near future for follow-up regarding her frequent reflux related issues  5. Angioedema, initial encounter This is the second episode of having swelling of the lip for no good reason 1 time it was associated with ACE inhibitor at this time there is no ACE inhibitor on board.  Recommend daily antihistamine family will go with Allegra daily.  Benadryl when necessary when these occur.  Consultation with allergy.  No life-threatening symptoms so therefore hold off on EpiPen currently - Ambulatory referral to Allergy  6. Need for vaccination Today - Flu Vaccine QUAD 62moIM (Fluarix, Fluzone & Alfiuria Quad PF)  7. Essential hypertension Blood pressure under good control will send in refills  8. MULTIPLE SCLEROSIS Followed by Dr. DMerlene Laughtercurrently.  Patient will be coming in to reestablish with a different neurologist.  Dr. DMerlene Laughteris retiring but should be handling her referrals if any problems family will notify [place] 9. Other hyperlipidemia Healthy diet LDL mildly elevated but HDL is fantastic risk for heart disease relatively low  10. Allergic reaction, initial encounter Please see above  Follow-up within 6 months

## 2022-01-26 DIAGNOSIS — I739 Peripheral vascular disease, unspecified: Secondary | ICD-10-CM | POA: Diagnosis not present

## 2022-01-26 DIAGNOSIS — M79674 Pain in right toe(s): Secondary | ICD-10-CM | POA: Diagnosis not present

## 2022-01-26 DIAGNOSIS — M79672 Pain in left foot: Secondary | ICD-10-CM | POA: Diagnosis not present

## 2022-01-26 DIAGNOSIS — M79671 Pain in right foot: Secondary | ICD-10-CM | POA: Diagnosis not present

## 2022-01-26 DIAGNOSIS — L110 Acquired keratosis follicularis: Secondary | ICD-10-CM | POA: Diagnosis not present

## 2022-01-26 DIAGNOSIS — M79675 Pain in left toe(s): Secondary | ICD-10-CM | POA: Diagnosis not present

## 2022-01-26 DIAGNOSIS — L609 Nail disorder, unspecified: Secondary | ICD-10-CM | POA: Diagnosis not present

## 2022-01-29 ENCOUNTER — Telehealth: Payer: Self-pay | Admitting: Internal Medicine

## 2022-01-29 NOTE — Telephone Encounter (Signed)
Pt called and said that she saw Dr. Laural Golden in June and he then turned over to the new dr .... She said that she needs a medication refilled to [facility].  I wasn't sure really who to send this message to.... can you help me?  I couldn't find Mitzi's name to send to her.

## 2022-01-29 NOTE — Telephone Encounter (Signed)
I spoke with the patient she says she was needing a prescription for pantoprazole. I notified her that Dr. Laural Golden had at her last office visit stopped the pantoprazole and started her on esomeprazole. I asked if esomeprazole was what she was needing she says no she had that. I told her she should stop pantoprazole and start esomeprazole. Patient states understanding and was transferred to [facility] to schedule an next available appointment as patient was supposed to have followed up 8 weeks after 10/21/2021, which never happened.

## 2022-02-04 ENCOUNTER — Telehealth: Payer: Medicare Other | Admitting: Physician Assistant

## 2022-02-04 DIAGNOSIS — H1031 Unspecified acute conjunctivitis, right eye: Secondary | ICD-10-CM

## 2022-02-04 MED ORDER — POLYMYXIN B-TRIMETHOPRIM 10000-0.1 UNIT/ML-% OP SOLN: OPHTHALMIC | 0 refills | Status: DC

## 2022-02-04 NOTE — Progress Notes (Signed)
I have spent 5 minutes in review of e-visit questionnaire, review and updating patient chart, medical decision making and response to patient.   Errika Narvaiz Cody Timber Lucarelli, PA-C    

## 2022-02-04 NOTE — Progress Notes (Signed)

## 2022-02-16 ENCOUNTER — Ambulatory Visit: Payer: Medicare Other | Admitting: Internal Medicine

## 2022-02-18 DIAGNOSIS — Z23 Encounter for immunization: Secondary | ICD-10-CM | POA: Diagnosis not present

## 2022-03-03 ENCOUNTER — Other Ambulatory Visit: Payer: Self-pay

## 2022-03-06 DIAGNOSIS — K2100 Gastro-esophageal reflux disease with esophagitis, without bleeding: Secondary | ICD-10-CM | POA: Diagnosis not present

## 2022-03-06 DIAGNOSIS — K22710 Barrett's esophagus with low grade dysplasia: Secondary | ICD-10-CM | POA: Diagnosis not present

## 2022-03-09 ENCOUNTER — Ambulatory Visit: Payer: Medicare Other | Admitting: Internal Medicine

## 2022-03-12 ENCOUNTER — Other Ambulatory Visit: Payer: Self-pay | Admitting: Surgery

## 2022-03-12 DIAGNOSIS — K22710 Barrett's esophagus with low grade dysplasia: Secondary | ICD-10-CM

## 2022-03-16 DIAGNOSIS — G894 Chronic pain syndrome: Secondary | ICD-10-CM | POA: Diagnosis not present

## 2022-03-16 DIAGNOSIS — Z79899 Other long term (current) drug therapy: Secondary | ICD-10-CM | POA: Diagnosis not present

## 2022-03-16 DIAGNOSIS — R262 Difficulty in walking, not elsewhere classified: Secondary | ICD-10-CM | POA: Diagnosis not present

## 2022-03-16 DIAGNOSIS — R5381 Other malaise: Secondary | ICD-10-CM | POA: Diagnosis not present

## 2022-03-16 DIAGNOSIS — G35 Multiple sclerosis: Secondary | ICD-10-CM | POA: Diagnosis not present

## 2022-03-20 ENCOUNTER — Ambulatory Visit
Admission: RE | Admit: 2022-03-20 | Discharge: 2022-03-20 | Disposition: A | Discharge status: Home / Self Care | Payer: Medicare Other | Source: Ambulatory Visit | Attending: Surgery | Admitting: Surgery

## 2022-03-20 DIAGNOSIS — K22710 Barrett's esophagus with low grade dysplasia: Secondary | ICD-10-CM

## 2022-03-20 DIAGNOSIS — K219 Gastro-esophageal reflux disease without esophagitis: Secondary | ICD-10-CM | POA: Diagnosis not present

## 2022-03-20 DIAGNOSIS — K224 Dyskinesia of esophagus: Secondary | ICD-10-CM | POA: Diagnosis not present

## 2022-03-23 ENCOUNTER — Encounter (INDEPENDENT_AMBULATORY_CARE_PROVIDER_SITE_OTHER): Payer: Self-pay | Admitting: Gastroenterology

## 2022-03-29 ENCOUNTER — Other Ambulatory Visit (INDEPENDENT_AMBULATORY_CARE_PROVIDER_SITE_OTHER): Payer: Self-pay | Admitting: Gastroenterology

## 2022-03-30 ENCOUNTER — Ambulatory Visit (INDEPENDENT_AMBULATORY_CARE_PROVIDER_SITE_OTHER): Payer: Medicare Other | Admitting: Gastroenterology

## 2022-03-30 DIAGNOSIS — H26493 Other secondary cataract, bilateral: Secondary | ICD-10-CM | POA: Diagnosis not present

## 2022-03-30 DIAGNOSIS — H26491 Other secondary cataract, right eye: Secondary | ICD-10-CM | POA: Diagnosis not present

## 2022-03-30 DIAGNOSIS — H35373 Puckering of macula, bilateral: Secondary | ICD-10-CM | POA: Diagnosis not present

## 2022-03-30 DIAGNOSIS — H524 Presbyopia: Secondary | ICD-10-CM | POA: Diagnosis not present

## 2022-03-30 DIAGNOSIS — Z961 Presence of intraocular lens: Secondary | ICD-10-CM | POA: Diagnosis not present

## 2022-03-30 DIAGNOSIS — H35363 Drusen (degenerative) of macula, bilateral: Secondary | ICD-10-CM | POA: Diagnosis not present

## 2022-03-30 LAB — HM DIABETES EYE EXAM

## 2022-04-06 ENCOUNTER — Encounter: Payer: Medicare Other | Admitting: Nurse Practitioner

## 2022-04-06 ENCOUNTER — Ambulatory Visit (INDEPENDENT_AMBULATORY_CARE_PROVIDER_SITE_OTHER): Payer: Medicare Other | Admitting: Nurse Practitioner

## 2022-04-06 ENCOUNTER — Encounter: Payer: Self-pay | Admitting: Nurse Practitioner

## 2022-04-06 VITALS — BP 119/82 | HR 96 | Ht 69.0 in | Wt 206.4 lb

## 2022-04-06 DIAGNOSIS — Z0000 Encounter for general adult medical examination without abnormal findings: Secondary | ICD-10-CM | POA: Diagnosis not present

## 2022-04-06 NOTE — Patient Instructions (Addendum)
Thank you for coming for your annual wellness visit.  Please follow through on any advice that was given to you by today's visit. Remember to maintain compliance with your medications as discussed today.  Also remember it is important to eat a healthy diet and to stay physically active on a daily basis.  Please follow through with any testing or recommended followup office visits as was discussed today. You are due the following test coming up:  Colonoscopy up to date. Due 08/2023 Mammogram up to date. Due 07/2022 PAP up to date. Due 02/2024 Lung Cancer Screening declined today Shingles completed per patient. Patient to bring in copy of shots record HIV screening postponed until next lab draw      Finally remembered that the annual wellness visit does not take the place of regularly scheduled office visits  chronic health problems such as hypertension/diabetes/cholesterol visits.

## 2022-04-06 NOTE — Progress Notes (Signed)
   Subjective:    Patient ID: Natalie Campos, female    DOB: 30-Oct-1960, 61 y.o.   MRN: [id number]  HPI AWV- Annual Wellness Visit  The patient was seen for their annual wellness visit. The patient's past medical history, surgical history, and family history were reviewed. Pertinent vaccines were reviewed ( tetanus, pneumonia, shingles, flu) The patient's medication list was reviewed and updated.  The height and weight were entered.  BMI recorded in electronic record elsewhere  Cognitive screening was completed. Outcome of Mini - Cog: 5/5   Falls /depression screening electronically recorded within record elsewhere  Current tobacco usage:From 61 years old 85 yeas old. 13 years of no smoking. (All patients who use tobacco were given written and verbal information on quitting)  Recent listing of emergency department/hospitalizations over the past year were reviewed.  current specialist the patient sees on a regular basis: GI   Medicare annual wellness visit patient questionnaire was reviewed.  A written screening schedule for the patient for the next 5-10 years was given. Appropriate discussion of followup regarding next visit was discussed.      Review of Systems  All other systems reviewed and are negative.  Pt having complications from stomach surgery. Pt is only able to eat certain foods due to motility. Whenever she eats, the food settles in throat or gets stuck in chest. Pt still having acid reflux even after surgery. Pt states acid comes all the way into mouth.   Pt also has MS and states she is not sure if the MS or her acid reflux.   Pt now has multiple cavities due to meds and dry mouth.    Objective:   Physical Exam Vitals reviewed.  Constitutional:      General: She is not in acute distress.    Appearance: Normal appearance. She is normal weight. She is not ill-appearing, toxic-appearing or diaphoretic.  HENT:     Head: Normocephalic and atraumatic.   Musculoskeletal:     Comments: Ambulates with cane  Neurological:     Mental Status: She is alert.  Psychiatric:        Mood and Affect: Mood normal.        Behavior: Behavior normal.           Assessment & Plan:   1. Medicare annual wellness visit, initial Adult wellness-complete.wellness physical was conducted today. Importance of diet and exercise were discussed in detail.  Importance of stress reduction and healthy living were discussed.  In addition to this a discussion regarding safety was also covered.  We also reviewed over immunizations and gave recommendations regarding current immunization needed for age.   In addition to this additional areas were also touched on including: Preventative health exams needed:  Colonoscopy up to date. Due 08/2023 Mammogram up to date. Due 07/2022 PAP up to date. Due 02/2024 Lung Cancer Screening declined today Shingles completed per patient. Patient to bring in copy of shots record HIV screening postponed until next lab draw  Patient was advised yearly wellness exam  *Follow up with Dr. Nicki Reaper for concerns within 2 weeks

## 2022-04-15 ENCOUNTER — Ambulatory Visit: Payer: Medicare Other

## 2022-04-16 ENCOUNTER — Ambulatory Visit: Payer: Medicare Other

## 2022-04-16 ENCOUNTER — Encounter (HOSPITAL_COMMUNITY)
Admission: RE | Admit: 2022-04-16 | Discharge: 2022-04-16 | Disposition: A | Discharge status: Home / Self Care | Payer: Medicare Other | Source: Ambulatory Visit | Attending: Neurology | Admitting: Neurology

## 2022-04-16 VITALS — BP 103/72 | HR 80 | Temp 98.5°F | Resp 16 | Ht 69.0 in | Wt 202.4 lb

## 2022-04-16 DIAGNOSIS — G35 Multiple sclerosis: Secondary | ICD-10-CM

## 2022-04-16 MED ORDER — METHYLPREDNISOLONE SODIUM SUCC 125 MG IJ SOLR
500.0000 mg | Freq: Once | INTRAMUSCULAR | Status: AC
  Administered 2022-04-16: 500 mg via INTRAVENOUS
  Filled 2022-04-16: qty 8

## 2022-04-16 MED ORDER — DIPHENHYDRAMINE HCL 25 MG PO CAPS
50.0000 mg | ORAL_CAPSULE | Freq: Once | ORAL | Status: AC
  Administered 2022-04-16: 50 mg via ORAL
  Filled 2022-04-16: qty 2

## 2022-04-16 MED ORDER — SODIUM CHLORIDE 0.9 % IV SOLN
600.0000 mg | Freq: Once | INTRAVENOUS | Status: AC
  Administered 2022-04-16: 600 mg via INTRAVENOUS
  Filled 2022-04-16 (×2): qty 20

## 2022-04-16 NOTE — Progress Notes (Signed)
Diagnosis: Multiple Sclerosis  Provider:  Phillips Odor MD  Procedure: Infusion  IV Type: Peripheral, IV Location: R Antecubital  Ocrevus (Ocrelizumab), Dose: 600 mg  Infusion Start Time: 7416  Infusion Stop Time: 1410  Post Infusion IV Care: Patient declined observation and Peripheral IV Discontinued  Discharge: Condition: Good, Destination: Home . AVS provided to patient.   Performed by:  Baxter Hire, RN

## 2022-04-17 NOTE — Addendum Note (Signed)
Encounter addended by: Baxter Hire, RN on: 04/17/2022 10:42 AM  Actions taken: Charge Capture section accepted

## 2022-04-20 DIAGNOSIS — H26492 Other secondary cataract, left eye: Secondary | ICD-10-CM | POA: Diagnosis not present

## 2022-04-21 ENCOUNTER — Other Ambulatory Visit: Payer: Self-pay | Admitting: Family Medicine

## 2022-04-21 DIAGNOSIS — E876 Hypokalemia: Secondary | ICD-10-CM

## 2022-04-23 ENCOUNTER — Other Ambulatory Visit: Payer: Self-pay | Admitting: Neurology

## 2022-04-23 ENCOUNTER — Encounter: Payer: Self-pay | Admitting: Neurology

## 2022-04-23 ENCOUNTER — Ambulatory Visit: Payer: Medicare Other | Admitting: Neurology

## 2022-04-23 VITALS — BP 121/75 | HR 104 | Ht 69.0 in | Wt 204.5 lb

## 2022-04-23 DIAGNOSIS — R29898 Other symptoms and signs involving the musculoskeletal system: Secondary | ICD-10-CM

## 2022-04-23 DIAGNOSIS — G35 Multiple sclerosis: Secondary | ICD-10-CM | POA: Diagnosis not present

## 2022-04-23 DIAGNOSIS — R1310 Dysphagia, unspecified: Secondary | ICD-10-CM

## 2022-04-23 DIAGNOSIS — H02401 Unspecified ptosis of right eyelid: Secondary | ICD-10-CM

## 2022-04-23 MED ORDER — TAMSULOSIN HCL 0.4 MG PO CAPS: 0.4000 mg | ORAL_CAPSULE | Freq: Every day | ORAL | 11 refills | Status: DC

## 2022-04-23 NOTE — Progress Notes (Signed)
[facility]  PATIENT: Natalie Campos DOB: 1960-09-21  REFERRING DOCTOR OR PCP Sallee Lange MD SOURCE: Patient, notes from Dr. Merlene Laughter, [place] and lab reports, MRI images personally reviewed.  _________________________________   HISTORICAL  CHIEF COMPLAINT:  Chief Complaint  Patient presents with   New Patient (Initial Visit)    Pt in room #11 with her husband. Pt here today to transfer care.    HISTORY OF PRESENT ILLNESS:  I had the pleasure of seeing your patient, Natalie Campos, at the [place] at [facility] for neurologic consultation regarding her multiple sclerosis.  Natalie Campos is a 61 year old woman who was diagnosed with MS around 61.  Natalie Campos was diagnosed with MS in her 20's after presenting with left ON.   Natalie Campos had another exacerbation in her early 61's affecting strength and gait.   Natalie Campos was placed on Avonex in 1994/1995 but had flu-like reactions and felt weaker.   Natalie Campos was placed on Copaxone a coupe years later and stopped around 2005 due to breakthrough activity on MRI and lipoatrophy.   Natalie Campos was on no DMT for 8-10 years and then placed on Ocrevus in 2018.    Natalie Campos is on Ocrevus since 2018.  Natalie Campos has had a rash after each infusion that would be off/on in between infusions.   Natalie Campos also was diagnosed with psoriasis afterwards.   Although Natalie Campos did very well initially, Natalie Campos has had a few times when Natalie Campos has needed to be in wheelchair.  Natalie Campos feels over the last 10 to 20 years that there have not been exacerbations though there have been times Natalie Campos has done worse.  Natalie Campos notes that her slope of progression on Ocrevus is probably about the same as it had been before the [place].  Serial MRIs between 2006 and 2023 have not shown new MS lesions in the brain.  Natalie Campos has an overall low plaque burden in the brain.  There are no lesions in the brainstem.  Natalie Campos does not have foci in the cervical spine.  Natalie Campos has not had a thoracic spine scan.  Currently, Natalie Campos has difficulty  with gait and uses a cane and uses a walker if more tired.    Her left leg is weaker than the right.   Natalie Campos notes dystonia in the left leg (has had since the 90s).    Natalie Campos notes the right hand has a reduced grip.    Handwriting is poor.      Natalie Campos notes dysphagia and is noted to have a large hiatal hernia and GERD.    Natalie Campos was told a swallow study showed no major issue.   .  Natalie Campos has urinary urgency and 2-3 x nocturia and some nocturnal incontinence.  Natalie Campos has been told her bladder doe not empty.   Natalie Campos has hesitancy and does not empty.     Natalie Campos is on oxybutynin without much benefit.   Myrbetriq worked better but was too expensive.   Natalie Campos has not been on tamsulosin  Natalie Campos has severe fatigue most afternoons.   Natalie Campos sleeps well most days.  Adderall has helped her fatigue and sleepiness some but was poorly tolerated due to elevated blood pressure.  Therefore, Natalie Campos rarely takes.  Provigil was added more recently and Natalie Campos tolerates that well.  Natalie Campos usually takes 400 mg in the morning.   Natalie Campos and her husband have noted some cognitive issues.  Natalie Campos is uncertain if that improved with the Provigil.  Natalie Campos has left knee DJD but due  to poor gait, orthopedics is not planning surgery.      IMAGING: MRI of the brain 07/23/2021, 07/20/2012 and 08/13/2009 showed identical pattern of T2/FLAIR hyperintense foci in the periventricular, juxtacortical   white matter.  The overall plaque burden is low.  There were no foci in the infratentorial white matter.  MRI of the cervical spine 07/23/2021 and 08/13/2009 showed normal spinal cord.  Natalie Campos does have some degenerative changes.  No spinal stenosis.  Natalie Campos has degenerative changes at C5-C6 and C6-C7 with potential for left C6 and right C7 nerve root compression on the more recent images.Marland Kitchen   REVIEW OF SYSTEMS: Constitutional: No fevers, chills, sweats, or change in appetite Eyes: No visual changes, double vision, eye pain Ear, nose and throat: No hearing loss, ear pain, nasal congestion, sore  throat Cardiovascular: No chest pain, palpitations Respiratory:  No shortness of breath at rest or with exertion.   No wheezes GastrointestinaI: No nausea, vomiting, diarrhea, abdominal pain, fecal incontinence.  Has GERD Genitourinary:  No dysuria, urinary retention or frequency.  No nocturia. Musculoskeletal:  No neck pain, back pain Integumentary: No rash, pruritus, skin lesions Neurological: as above Psychiatric: No depression at this time.  No anxiety Endocrine: No palpitations, diaphoresis, change in appetite, change in weigh or increased thirst Hematologic/Lymphatic:  No anemia, purpura, petechiae. Allergic/Immunologic: No itchy/runny eyes, nasal congestion, recent allergic reactions, rashes  ALLERGIES: Allergies  Allergen Reactions   Ace Inhibitors Anaphylaxis   Atenolol Anaphylaxis   Losartan Swelling    Severe tongue swelling   Penicillins     Childhood Allergy - caused coma  Has patient had a PCN reaction causing immediate rash, facial/tongue/throat swelling, SOB or lightheadedness with hypotension: No Has patient had a PCN reaction causing severe rash involving mucus membranes or skin necrosis: No Has patient had a PCN reaction that required hospitalization: No Has patient had a PCN reaction occurring within the last 10 years: No If all of the above answers are "NO", then may proceed with Cephalosporin use.     HOME MEDICATIONS:  Current Outpatient Medications:    amLODipine (NORVASC) 5 MG tablet, TAKE 1 TABLET BY MOUTH DAILY, Disp: 90 tablet, Rfl: 1   amphetamine-dextroamphetamine (ADDERALL) 10 MG tablet, Take 10 mg by mouth daily., Disp: , Rfl:    baclofen (LIORESAL) 10 MG tablet, Take 10 mg by mouth 2 (two) times daily. , Disp: , Rfl:    diazepam (VALIUM) 10 MG tablet, Take 10 mg by mouth in the morning, at noon, and at bedtime., Disp: , Rfl:    docusate sodium (COLACE) 100 MG capsule, Take 100 mg by mouth 2 (two) times daily., Disp: , Rfl:    DULoxetine  (CYMBALTA) 60 MG capsule, Take 60 mg by mouth daily. , Disp: , Rfl:    esomeprazole (NEXIUM) 40 MG capsule, TAKE 1 CAPSULE BY MOUTH TWICE  DAILY BEFORE A MEAL, Disp: 180 capsule, Rfl: 0   ferrous sulfate (FERROUSUL) 325 (65 FE) MG tablet, Take 1 tablet (325 mg total) by mouth daily with breakfast., Disp: , Rfl: 3   fish oil-omega-3 fatty acids 1000 MG capsule, Take 1 g by mouth daily., Disp: , Rfl:    gabapentin (NEURONTIN) 600 MG tablet, Take 600 mg by mouth 3 (three) times daily. , Disp: , Rfl:    hydrochlorothiazide (MICROZIDE) 12.5 MG capsule, Take one tablet po daily, Disp: 90 capsule, Rfl: 3   HYDROmorphone (DILAUDID) 4 MG tablet, Take 4 mg by mouth every 12 (twelve) hours as needed for severe  pain., Disp: , Rfl:    modafinil (PROVIGIL) 200 MG tablet, Take 200-400 mg by mouth every morning., Disp: , Rfl:    multivitamin (THERAGRAN) per tablet, Take 1 tablet by mouth daily., Disp: , Rfl:    Ocrelizumab (OCREVUS IV), Inject into the vein. Infused every 6 months., Disp: , Rfl:    olopatadine (PATANOL) 0.1 % ophthalmic solution, Place 1 drop into both eyes 2 (two) times daily., Disp: 5 mL, Rfl: 12   oxybutynin (DITROPAN) 5 MG tablet, Take 5 mg by mouth 3 (three) times daily., Disp: , Rfl:    polyethylene glycol powder (GLYCOLAX/MIRALAX) 17 GM/SCOOP powder, MIX 25.5 GRAMS WITH WATER OR JUICE ONCE DAILY., Disp: 510 g, Rfl: 5   potassium chloride (KLOR-CON) 10 MEQ tablet, TAKE 1 TABLET BY MOUTH TWICE  DAILY, Disp: 200 tablet, Rfl: 0   sucralfate (CARAFATE) 1 g tablet, TAKE 1 TABLET BY MOUTH 4 TIMES  DAILY, Disp: 360 tablet, Rfl: 3   topiramate (TOPAMAX) 100 MG tablet, Take 100 mg by mouth daily., Disp: , Rfl:    traMADol (ULTRAM) 50 MG tablet, Take 100 mg by mouth every 6 (six) hours., Disp: , Rfl:    trimethoprim-polymyxin b (POLYTRIM) ophthalmic solution, Apply 1-2 drops into affected eye QID x 5 days., Disp: 10 mL, Rfl: 0   Vitamin D, Ergocalciferol, (DRISDOL) 1.25 MG (50000 UNIT) CAPS capsule,  Take 50,000 Units by mouth every 7 (seven) days., Disp: , Rfl:   PAST MEDICAL HISTORY: Past Medical History:  Diagnosis Date   Anemia    Anxiety    Barrett's esophagus    Bulging discs    Cataract    Complication of anesthesia    Complication of anesthesia    patietn wakes up easily- has anxiety due to waking up during a simple procedure   Depression    DJD (degenerative joint disease)    Dog bite(E906.0) 12/06/2009   Eczema    GERD (gastroesophageal reflux disease)    High triglycerides    Hyperlipidemia    Hypertension    IBS (irritable bowel syndrome)    contipation predominant    Iron deficiency anemia    Multiple sclerosis ([place]) 05/11/1988   Dx in 1990 most recent hospitalization for a flareis in 2011    Obesity     PAST SURGICAL HISTORY: Past Surgical History:  Procedure Laterality Date   bilateral cataract surgery      BIOPSY  01/28/2011   Procedure: BIOPSY;  Surgeon: Rogene Houston, MD;  [facility];  Service: Endoscopy;  Laterality: N/A;   BIOPSY N/A 08/25/2013   Procedure: BIOPSY;  Surgeon: Rogene Houston, MD;  Location: [facility];  Service: Endoscopy;  Laterality: N/A;   BIOPSY  02/12/2021   Procedure: BIOPSY;  Surgeon: Rogene Houston, MD;  [facility];  Service: Endoscopy;;   CHOLECYSTECTOMY  05/11/2008   Dr. Anthony Sar    COLONOSCOPY WITH PROPOFOL N/A 08/25/2013   Procedure: COLONOSCOPY WITH PROPOFOL;  Surgeon: Rogene Houston, MD;  Location: [facility];  Service: Endoscopy;  Laterality: N/A;  in cecum at 0811 out at 0822 = 11 minutes   ESOPHAGOGASTRODUODENOSCOPY  01/28/2011   Procedure: ESOPHAGOGASTRODUODENOSCOPY (EGD);  Surgeon: Rogene Houston, MD;  [facility];  Service: Endoscopy;  Laterality: N/A;  1:00   ESOPHAGOGASTRODUODENOSCOPY  03/24/2021   Procedure: ESOPHAGOGASTRODUODENOSCOPY (EGD);  Surgeon: Johnathan Hausen, MD;  Location: [facility];  Service: General;;   ESOPHAGOGASTRODUODENOSCOPY (EGD) WITH PROPOFOL N/A 08/25/2013    Procedure: ESOPHAGOGASTRODUODENOSCOPY (  EGD) WITH PROPOFOL;  Surgeon: Rogene Houston, MD;  Location: [facility];  Service: Endoscopy;  Laterality: N/A;   ESOPHAGOGASTRODUODENOSCOPY (EGD) WITH PROPOFOL N/A 02/12/2021   Procedure: ESOPHAGOGASTRODUODENOSCOPY (EGD) WITH PROPOFOL;  Surgeon: Rogene Houston, MD;  [facility];  Service: Endoscopy;  Laterality: N/A;  7:30   WISDOM TOOTH EXTRACTION     XI ROBOTIC ASSISTED HIATAL HERNIA REPAIR N/A 03/24/2021   Procedure: XI ROBOTIC ASSISTED HIATAL HERNIA REPAIR WITH FUNDOPLICATION;  Surgeon: Johnathan Hausen, MD;  Location: [facility];  Service: General;  Laterality: N/A;    FAMILY HISTORY: History reviewed. No pertinent family history.  SOCIAL HISTORY: Social History   Socioeconomic History   Marital status: Married    Spouse name: Not on file   Number of children: 3   Years of education: Not on file   Highest education level: Not on file  Occupational History   Occupation: disabled since 2002  Tobacco Use   Smoking status: Former    Packs/day: 1.50    Years: 30.00    Total pack years: 45.00    Types: Cigarettes    Quit date: 03/08/2008    Years since quitting: 14.1    Passive exposure: Past   Smokeless tobacco: Never  Vaping Use   Vaping Use: Never used  Substance and Sexual Activity   Alcohol use: No    Alcohol/week: 0.0 standard drinks of alcohol   Drug use: No   Sexual activity: Yes    Birth control/protection: Post-menopausal  Other Topics Concern   Not on file  Social History Narrative   Two grown children, still birth infant    Social Determinants of Health   Financial Resource Strain: Not on file  Food Insecurity: Not on file  Transportation Needs: Not on file  Physical Activity: Not on file  Stress: Not on file  Social Connections: Not on file  Intimate Partner Violence: Not on file       PHYSICAL EXAM  Vitals:   04/23/22 1323  BP: 121/75  Pulse: (!) 104  Weight: 204 lb 8 oz (92.8 kg)  Height:  '5\' 9"'$  (1.753 m)    Body mass index is 30.2 kg/m.   General: The patient is well-developed and well-nourished and in no acute distress  HEENT:  Head is [place]/AT.  Sclera are anicteric.  Funduscopic exam shows normal optic discs and retinal vessels.  Neck: No carotid bruits are noted.  The neck is nontender.  Cardiovascular: The heart has a regular rate and rhythm with a normal S1 and S2. There were no murmurs, gallops or rubs.    Skin: Extremities are without rash or  edema.  Musculoskeletal:  Back is nontender  Neurologic Exam  Mental status: The patient is alert and oriented x 3 at the time of the examination. The patient has apparent normal recent and remote memory, with an apparently normal attention span and concentration ability.   Speech is normal.  Cranial nerves: Extraocular movements are full. Pupils are equal, round, and reactive to light and accomodation.  Natalie Campos has mild right ptosis.  Color vision is symmetric.  Facial strength and sensation was normal.  Natalie Campos has mild dysarthria..  The tongue is midline, and the patient has symmetric elevation of the soft palate. No obvious hearing deficits are noted.  Motor:  Muscle bulk is normal.   Tone is normal. Strength is 4/5 in the iliopsoas and 4+/5 in other proximal limb muscles and 5 / 5 in distal muscles  Sensory: Sensory  testing is intact to pinprick, soft touch and vibration sensation in the arms.  Natalie Campos reported reduced vibration sensation in the left leg relative to the right..  Coordination: Cerebellar testing reveals good finger-nose-finger and heel-to-shin bilaterally.  Gait and station: Natalie Campos needs to use her arms to rise from a chair.  Once up the station was stable.  Her gait was wide.  Natalie Campos is unable to do a tandem gait.  Romberg is negative.   Reflexes: Deep tendon reflexes are symmetric and normal in the arms and knees and 1 at the ankles..   Plantar responses are flexor.    DIAGNOSTIC DATA (LABS, IMAGING, TESTING) - I  reviewed patient records, labs, notes, testing and imaging myself where available.  Lab Results  Component Value Date   WBC 8.8 03/25/2021   HGB 13.4 03/25/2021   HCT 38.8 03/25/2021   MCV 91.5 03/25/2021   PLT 232 03/25/2021      Component Value Date/Time   NA 143 11/13/2021 1514   K 4.1 11/13/2021 1514   CL 100 11/13/2021 1514   CO2 25 11/13/2021 1514   GLUCOSE 114 (H) 11/13/2021 1514   GLUCOSE 130 (H) 03/21/2021 0926   BUN 9 11/13/2021 1514   CREATININE 1.06 (H) 11/13/2021 1514   CREATININE 0.78 09/18/2013 0949   CALCIUM 10.2 11/13/2021 1514   PROT 7.6 03/04/2020 1446   ALBUMIN 4.8 03/04/2020 1446   AST 17 03/04/2020 1446   ALT 14 03/04/2020 1446   ALKPHOS 114 03/04/2020 1446   BILITOT <0.2 03/04/2020 1446   GFRNONAA >60 03/24/2021 1252   GFRAA 77 03/04/2020 1446   Lab Results  Component Value Date   CHOL 241 (H) 11/13/2021   HDL 92 11/13/2021   LDLCALC 131 (H) 11/13/2021   TRIG 108 11/13/2021   CHOLHDL 2.6 11/13/2021   Lab Results  Component Value Date   HGBA1C 5.2 07/06/2014   Lab Results  Component Value Date   VITAMINB12 910 06/11/2014   Lab Results  Component Value Date   TSH 1.530 11/13/2021       ASSESSMENT AND PLAN  MULTIPLE SCLEROSIS - Plan: IgG, IgA, IgM  Ptosis of right eyelid - Plan: Myasthenia Gravis Profile  Dysphagia, unspecified type - Plan: Myasthenia Gravis Profile, CK, ANA+ENA+DNA/DS+Scl 70+SjoSSA/B  Proximal leg weakness - Plan: Myasthenia Gravis Profile, CK, ANA+ENA+DNA/DS+Scl 70+SjoSSA/B   In summary, Natalie Campos is a 61 year old woman with multiple sclerosis.  Her MRI of the brain actually shows a fairly low plaque burden.  The MRI of the cervical spine showed a normal spinal cord.  We do not have an MRI of the thoracic spine and a lesion there could explain her gait issues.  We do not have a good explanation for her ptosis, swallowing difficulty, mild dysarthria and proximal arm weakness.  It is possible Natalie Campos has a  superimposed myopathy or myasthenia gravis and we will check blood work for those.  I will also check ANA.  Additional evaluation or treatment may be necessary if any of these tests come back abnormal.  If symptoms worsen we would consider an NCV/EMG study  MRIs have been very stable for many years despite part of the time off of medication.  It is probable that Natalie Campos will be able to stop the Ocrevus.  Therefore, we will consider holding her next infusion and monitoring for several years with MRIs to determine if there is any breakthrough activity (if present would restart a disease modifying therapy).  I added tamsulosin to help with  her urinary hesitancy.  Additionally I advised her to split the Provigil to 200 mg in the morning and 200 mg 3 to 4 hours later to see if that helps her symptoms better.  I can take over some of the medications that have been prescribed by her former neurology practice.  However, we are not a pain management practice and do not write chronic opiates.  Therefore, if Natalie Campos requires that level of pain treatment Natalie Campos will need to have that written by either primary care or see pain management.  Natalie Campos will return to see me in 6 months or sooner if there are new or worsening neurologic symptoms.  Thank you for asking me to see Natalie Campos.  Please let me know if I can be of further assistance with her or other patients in the future.  78-minute office visit with the majority of the time spent face-to-face for history and physical, discussion/counseling and decision-making.  Additional time with extensive imaging review, record review and documentation.   Tauren Delbuono A. Felecia Shelling, MD, [facility] 37/29/0211, 1:55 PM Certified in Neurology, Clinical Neurophysiology, Sleep Medicine and Neuroimaging  [facility] [address] [place], [place] [address] [phone]

## 2022-04-27 ENCOUNTER — Ambulatory Visit (INDEPENDENT_AMBULATORY_CARE_PROVIDER_SITE_OTHER): Payer: Medicare Other | Admitting: Gastroenterology

## 2022-04-27 ENCOUNTER — Encounter: Payer: Self-pay | Admitting: *Deleted

## 2022-05-13 ENCOUNTER — Encounter: Payer: Self-pay | Admitting: Family Medicine

## 2022-05-14 ENCOUNTER — Other Ambulatory Visit: Payer: Self-pay | Admitting: Family Medicine

## 2022-05-14 LAB — MYASTHENIA GRAVIS PROFILE
AChR Binding Ab, Serum: <0.03 nmol/L (ref 0.00–0.24)
AChR-modulating Ab: 0 % (ref 0–45)
Acetylchol Block Ab: 10 % (ref 0–25)
Anti-striation Abs: NEGATIVE

## 2022-05-14 LAB — IGG, IGA, IGM
IgA/Immunoglobulin A, Serum: 394 mg/dL — ABNORMAL HIGH (ref 87–352)
IgG (Immunoglobin G), Serum: 1184 mg/dL (ref 586–1602)
IgM (Immunoglobulin M), Srm: 23 mg/dL — ABNORMAL LOW (ref 26–217)

## 2022-05-14 LAB — MUSK ANTIBODIES: MuSK Antibodies: <1 U/mL

## 2022-05-14 LAB — CK: Total CK: 31 U/L — ABNORMAL LOW (ref 32–182)

## 2022-05-14 NOTE — Telephone Encounter (Signed)
Nurses Please pass along to [place] that I have to look into several issues regarding this I am certainly glad that she has found a good neurologist to help her MS As for pain management I certainly sympathize with her.  At the same time though-as a primary care physician within a healthcare network we have tight guidelines on pain management medications.  Part of the issue is we are limited on the amount of daily opioids that can be prescribed.  Plus also when individuals are on benzodiazepine such as Valium we are also quite limited on opioid use in those situations because these medications can potentially interact together to cause accidental overdose and death.  Even though she has been on this combination for quite some time there are limitations placed on primary care.  I will be reaching out to a couple different specialist for their input regarding this issue then can make a determination regarding our ability to prescribe these.  If we do prescribe these any individual under our care for pain management is required to do every 38-monthvisit.  Certainly our goal is to be as accommodating as possible but we also have to follow within guidelines put forth on primary care within our healthcare system  Hopefully will know something within the next 7 days thank you-Dr. SNicki Reaper

## 2022-05-15 NOTE — Telephone Encounter (Signed)
Patient advised per Dr Nicki Reaper: I have to look into several issues regarding this I am certainly glad that she has found a good neurologist to help her MS As for pain management I certainly sympathize with her.   At the same time though-as a primary care physician within a healthcare network we have tight guidelines on pain management medications.  Part of the issue is we are limited on the amount of daily opioids that can be prescribed.  Plus also when individuals are on benzodiazepine such as Valium we are also quite limited on opioid use in those situations because these medications can potentially interact together to cause accidental overdose and death.   Even though she has been on this combination for quite some time there are limitations placed on primary care.  I will be reaching out to a couple different specialist for their input regarding this issue then can make a determination regarding our ability to prescribe these.  If we do prescribe these any individual under our care for pain management is required to do every 49-monthvisit.   Certainly our goal is to be as accommodating as possible but we also have to follow within guidelines put forth on primary care within our healthcare system   Hopefully will know something within the next 7 days  Patient verbalized understanding.

## 2022-05-17 ENCOUNTER — Other Ambulatory Visit: Payer: Self-pay | Admitting: Family Medicine

## 2022-05-18 NOTE — Telephone Encounter (Signed)
Nurses One of the aspects that we have to follow is the calculated amount of opioids per day.  Each medication has an assigned amount.  It is because of this that her current medication regimen is not something I can prescribe.  It falls outside of [facility] guidelines for primary care doctors.  As for the pain medicine-if the patient was just on tramadol that is something that I would be able to prescribe.  What complicates matters is that she is also on diazepam 10 mg 4 times daily this increases the risk of accidental drug interaction and or drug overdose.  So given her situation I would be able to prescribe tramadol but hydromorphone I would not be able to prescribe.  If she felt she needed hydromorphone she would need to become established with pain management.  I am concerned that she has been on hydromorphone for quite some time and if she made the decision to be off of hydromorphone and only on tramadol this would need to be tapered down over a short span of time  If she is interested in being referred to pain management I would recommend setting her up with [facility]  If she is going to stick with just tramadol and not other medicines then she needs to establish this issue with [place] quickly so we can address this issue in other words within the next 7 to 10 days

## 2022-05-19 NOTE — Telephone Encounter (Signed)
Patient advised of providers recommendations and stated she would talk with her husband because sometimes the pain is unbearable and tramadol doesn't help and she was taking dilaudid everyday and has tried going to every other day and wants to know how she would even taper that medicine after being on it so long and what does she do for the excruciating pain. Patient states they don't like to drive much in [place] for pain management so she will talk with husband to see what he thinks would be best. Patient states she has to take the diazepam everyday for muscles.

## 2022-05-20 NOTE — Telephone Encounter (Signed)
Nurses There is no easy answer for her situation As for tapering the Dilaudid it would go from 4 mg twice a day which she is currently doing to 2 mg 3 times a day for 10 days Then 2 mg twice daily for 10 Days Then a half a tablet 3 times daily for 10 days Then a half a tablet twice daily for 10 days Then a half a tablet once daily for 5 days As 1 can see if it is a slow taper  If the patient relates that she is having severe pain even with her current regimen I would highly advise pain management for ongoing care of her issues  If on the other side going she feels like things are going well and she wants to taper off the hydromorphone that is how we would recommend tapering off.  If she decides to taper off hydromorphone we can provide a prescription to help supply her enough medicine to do the taper but we would not be able to prescribe this type of medicine ongoing because it is outside of our guidelines  Typically if we refer to [facility] Pain management facilities will require monthly visits.  This is now the standard of care.

## 2022-05-26 ENCOUNTER — Other Ambulatory Visit (INDEPENDENT_AMBULATORY_CARE_PROVIDER_SITE_OTHER): Payer: Self-pay

## 2022-05-26 ENCOUNTER — Other Ambulatory Visit: Payer: Self-pay

## 2022-05-26 DIAGNOSIS — K219 Gastro-esophageal reflux disease without esophagitis: Secondary | ICD-10-CM

## 2022-05-26 DIAGNOSIS — K2270 Barrett's esophagus without dysplasia: Secondary | ICD-10-CM

## 2022-05-26 MED ORDER — ESOMEPRAZOLE MAGNESIUM 40 MG PO CPDR: DELAYED_RELEASE_CAPSULE | ORAL | 0 refills | Status: DC

## 2022-05-26 MED ORDER — ESOMEPRAZOLE MAGNESIUM 40 MG PO CPDR: DELAYED_RELEASE_CAPSULE | ORAL | 3 refills | Status: DC

## 2022-05-27 ENCOUNTER — Encounter: Payer: Self-pay | Admitting: *Deleted

## 2022-05-28 ENCOUNTER — Telehealth: Payer: Self-pay | Admitting: Neurology

## 2022-05-28 NOTE — Telephone Encounter (Signed)
Dr. Felecia Shelling see below and please advise how you would like to proceed?

## 2022-05-28 NOTE — Telephone Encounter (Signed)
Pt states that the Pain Management dr is asking for a referral to treat pt.  [facility] [address] [place] [phone] fax#[phone]

## 2022-05-29 NOTE — Telephone Encounter (Signed)
Referral faxed to below faxed #, confirmation received. Patient aware of this.

## 2022-06-03 DIAGNOSIS — M79671 Pain in right foot: Secondary | ICD-10-CM | POA: Diagnosis not present

## 2022-06-03 DIAGNOSIS — L609 Nail disorder, unspecified: Secondary | ICD-10-CM | POA: Diagnosis not present

## 2022-06-03 DIAGNOSIS — I739 Peripheral vascular disease, unspecified: Secondary | ICD-10-CM | POA: Diagnosis not present

## 2022-06-03 DIAGNOSIS — M79674 Pain in right toe(s): Secondary | ICD-10-CM | POA: Diagnosis not present

## 2022-06-03 DIAGNOSIS — M79675 Pain in left toe(s): Secondary | ICD-10-CM | POA: Diagnosis not present

## 2022-06-03 DIAGNOSIS — L110 Acquired keratosis follicularis: Secondary | ICD-10-CM | POA: Diagnosis not present

## 2022-06-03 DIAGNOSIS — M79672 Pain in left foot: Secondary | ICD-10-CM | POA: Diagnosis not present

## 2022-06-03 NOTE — Telephone Encounter (Signed)
Received fax from [facility] that pt has decided not to schedule with them. Pt told them she is holding off on scheduling for now and will call back later.

## 2022-06-09 ENCOUNTER — Encounter: Payer: Self-pay | Admitting: Family Medicine

## 2022-06-10 ENCOUNTER — Other Ambulatory Visit: Payer: Self-pay | Admitting: Family Medicine

## 2022-06-10 ENCOUNTER — Telehealth: Payer: Self-pay | Admitting: Family Medicine

## 2022-06-10 NOTE — Telephone Encounter (Signed)
Nurses This telephone message is in reference to her MyChart message I certainly understand her predicament I am sympathetic to her situation Please be aware and make sure the patient is aware that prescribing hydromorphone #1 will require a visit to establish pain management #2 that we are doing this as a temporary gap until she establishes with pain management #3 that if for some reason the visit with pain management falls through or they refused to prescribe this medication ongoing then we will be working with the patient to either taper down or off of this medicine or establish her with a different pain management office, we will not be able to do long-term pain management of these medicines This is following along with [facility] policy for primary care pain management  In order to prescribe any controlled medication to any of our patients we have to do a pain management visit 1.  We need to set her up for a visit somewhere within the next couple weeks regarding pain management 2.  She will need to bring all of her medicines to that visit 3.  It is standard and customary to do urine drug screen along with pain management contract even for situations where we are just prescribing for a few months #4 once the appointment is scheduled, I will send in a limited supply of the hydromorphone to last her until that visit #5 in regards to the diazepam with all the pain medicine I would recommend that she try to go to a lower amount of the diazepam perhaps 5 mg 4 times per day rather than 10 mg this is the lessen the risk of drug interactions #6 it is necessary for the patient to have a home dose of Narcan intranasal spray-if she does not have that at her home already please go ahead and send this into her pharmacy.  This is in accordance with current standards and practices.  Please make sure patient has full understanding of all of this.  If any problem or issues let me know.  Also obviously let me know  when her follow-up visit will be

## 2022-06-10 NOTE — Telephone Encounter (Signed)
My chart message sent to pt.

## 2022-06-10 NOTE — Telephone Encounter (Signed)
Nurses Please see telephone message Thank you

## 2022-06-11 NOTE — Telephone Encounter (Signed)
Pt and pt husband read my chart message per Epic

## 2022-06-11 NOTE — Telephone Encounter (Signed)
Received updated fax that pt is scheduled for 09/15/22 at 11am.

## 2022-06-22 ENCOUNTER — Ambulatory Visit: Payer: Medicare Other | Admitting: Family Medicine

## 2022-06-26 ENCOUNTER — Other Ambulatory Visit: Payer: Self-pay

## 2022-06-29 ENCOUNTER — Ambulatory Visit (INDEPENDENT_AMBULATORY_CARE_PROVIDER_SITE_OTHER): Payer: Medicare Other | Admitting: Gastroenterology

## 2022-06-29 ENCOUNTER — Ambulatory Visit: Payer: Medicare Other | Admitting: Internal Medicine

## 2022-06-29 ENCOUNTER — Encounter: Payer: Self-pay | Admitting: Internal Medicine

## 2022-06-29 VITALS — BP 136/90 | HR 125 | Temp 98.0°F | Resp 16 | Ht 69.0 in | Wt 208.5 lb

## 2022-06-29 DIAGNOSIS — R21 Rash and other nonspecific skin eruption: Secondary | ICD-10-CM

## 2022-06-29 DIAGNOSIS — T783XXA Angioneurotic edema, initial encounter: Secondary | ICD-10-CM | POA: Diagnosis not present

## 2022-06-29 DIAGNOSIS — J310 Chronic rhinitis: Secondary | ICD-10-CM

## 2022-06-29 MED ORDER — FLUTICASONE PROPIONATE 50 MCG/ACT NA SUSP: 2.0000 | Freq: Every day | NASAL | 5 refills | Status: DC

## 2022-06-29 NOTE — Patient Instructions (Addendum)
Chronic Rhinitis: - Use nasal saline rinses before nose sprays such as with Neilmed Sinus Rinse.  Use distilled water.   - Hold all anti-histamines 3 days prior to next visit for aeroallegen skin testing 1-59.  Idiopathic Urticaria/Angioedema (Hives/Swelling): - At this time etiology of swelling is unknown. Swelling can be caused by a variety of different triggers including illness/infection, exercise, pressure, vibrations, extremes of temperature to name a few however majority of the time there is no identifiable trigger.  In about 10% of patients, swelling can happen without the hives.  -Keep a diary to track how often this happens and any changes in your routine.  - Hold all anti-histamines 3 days prior to next visit.  Rash: - Do a daily soaking tub bath in warm water for 10-15 minutes.  - Use a gentle, unscented cleanser at the end of the bath (such as Dove unscented bar or baby wash, or Aveeno sensitive body wash). Then rinse, pat half-way dry, and apply a gentle, unscented moisturizer cream or ointment (Cerave, Cetaphil, Eucerin, Aveeno)  all over while still damp. The skin should be moisturized with a gentle, unscented moisturizer at least twice daily.  - Use only unscented liquid laundry detergent. - Take pictures of rashes. Does not sound like eczema, possibly hives.  Follow up: okay to overbook 2/26 with me at1:30, 2 or 2:30 for skin testing.

## 2022-06-29 NOTE — Progress Notes (Signed)
NEW PATIENT  Date of Service/Encounter:  06/29/22  Consult requested by: Kathyrn Drown, MD   Subjective:   Natalie Campos (DOB: 22-Sep-1960) is a 62 y.o. female who presents to the clinic on 06/29/2022 with a chief complaint of Angioedema (Her face/eyes swells ), Rash (Breaks out in rashes. Has been having rashes around her scalp and around her private areas. Had a biopsy done and it showed psoriasis. When she started ocrevus she started breaking out. ), Headache (Has sinus headaches every day ), Gastroesophageal Reflux, Frequent Infections, and Other (Dry skin) .    History obtained from: chart review and patient. She is a very poor historian.    Swelling: Reports onset of swelling for the past few years, unclear when it started.  States once she had cheek swelling after being at the beach; can't recall how long it lasted or what she did for it to resolve.  It was unilaterally.  Also has intermittent puffiness under the eyes.  Chart review shows history of lip swelling with ACEI and then also reported another episode to her PCP but she can't recall exactly what happened then.  No changes in diet.  She does eat red meat but infrequently and it has not been related to it.   Rashes: She also has red rashes across her neck and chest.  They itch but denies hives, welts or eczema.  Just that the rash is red.  Not sure how long it lasts. Happens every few months.  She was on Ocrevus infusion for MS and they are planning on stopping it; last dose was December, it is a q94monthdosing.   Of note, she has a history of psoriasis but this has improved over time.    Rhinitis:  Ongoing for years.  Symptoms include:  puffy eyes, nasal congestion, rhinorrhea, post nasal drainage, and sneezing, sinus headaches  Occurs year-round Potential triggers: not sure Treatments tried:  Humidifier and air purifier  2-3 days ago, had sinus/headache medication OTC  Previous allergy testing: no History of  reflux/heartburn: yes followed by gen surgery and GI for it. History of sinus surgery: no Nonallergic triggers: none   Past Medical History: Past Medical History:  Diagnosis Date   Anemia    Angio-edema    Anxiety    Barrett's esophagus    Bulging discs    Cataract    Complication of anesthesia    Complication of anesthesia    patietn wakes up easily- has anxiety due to waking up during a simple procedure   Depression    DJD (degenerative joint disease)    Dog bite(E906.0) 12/06/2009   Eczema    GERD (gastroesophageal reflux disease)    High triglycerides    Hyperlipidemia    Hypertension    IBS (irritable bowel syndrome)    contipation predominant    Iron deficiency anemia    [place]   Dx in 1990 most recent hospitalization for a flareis in 2011    Obesity    Past Surgical History: Past Surgical History:  Procedure Laterality Date   bilateral cataract surgery      BIOPSY  01/28/2011   Procedure: BIOPSY;  Surgeon: NRogene Houston MD;  [facility];  Service: Endoscopy;  Laterality: N/A;   BIOPSY N/A 08/25/2013   Procedure: BIOPSY;  Surgeon: NRogene Houston MD;  Location: [facility];  Service: Endoscopy;  Laterality: N/A;   BIOPSY  02/12/2021   Procedure: BIOPSY;  Surgeon: RLaural Golden  Mechele Dawley, MD;  Location: [facility];  Service: Endoscopy;;   CHOLECYSTECTOMY  05/11/2008   Dr. Anthony Sar    COLONOSCOPY WITH PROPOFOL N/A 08/25/2013   Procedure: COLONOSCOPY WITH PROPOFOL;  Surgeon: Rogene Houston, MD;  Location: [facility];  Service: Endoscopy;  Laterality: N/A;  in cecum at 0811 out at 0822 = 11 minutes   ESOPHAGOGASTRODUODENOSCOPY  01/28/2011   Procedure: ESOPHAGOGASTRODUODENOSCOPY (EGD);  Surgeon: Rogene Houston, MD;  [facility];  Service: Endoscopy;  Laterality: N/A;  1:00   ESOPHAGOGASTRODUODENOSCOPY  03/24/2021   Procedure: ESOPHAGOGASTRODUODENOSCOPY (EGD);  Surgeon: Johnathan Hausen, MD;  Location: [facility];  Service:  General;;   ESOPHAGOGASTRODUODENOSCOPY (EGD) WITH PROPOFOL N/A 08/25/2013   Procedure: ESOPHAGOGASTRODUODENOSCOPY (EGD) WITH PROPOFOL;  Surgeon: Rogene Houston, MD;  Location: [facility];  Service: Endoscopy;  Laterality: N/A;   ESOPHAGOGASTRODUODENOSCOPY (EGD) WITH PROPOFOL N/A 02/12/2021   Procedure: ESOPHAGOGASTRODUODENOSCOPY (EGD) WITH PROPOFOL;  Surgeon: Rogene Houston, MD;  [facility];  Service: Endoscopy;  Laterality: N/A;  7:30   WISDOM TOOTH EXTRACTION     XI ROBOTIC ASSISTED HIATAL HERNIA REPAIR N/A 03/24/2021   Procedure: XI ROBOTIC ASSISTED HIATAL HERNIA REPAIR WITH FUNDOPLICATION;  Surgeon: Johnathan Hausen, MD;  Location: [facility];  Service: General;  Laterality: N/A;    Family History: Family History  Problem Relation Age of Onset   Eczema Father    Asthma Brother    Asthma Maternal Uncle    Asthma Paternal Uncle    Allergic rhinitis Daughter     Social History:  Lives in a 59 year house Flooring in bedroom: carpet Pets: dog Tobacco use/exposure: none Job: disabled  Medication List:  Allergies as of 06/29/2022       Reactions   Ace Inhibitors Anaphylaxis   Atenolol Anaphylaxis   Losartan Swelling   Severe tongue swelling   Milk-related Compounds    rash   Penicillins    Childhood Allergy - caused coma  Has patient had a PCN reaction causing immediate rash, facial/tongue/throat swelling, SOB or lightheadedness with hypotension: No Has patient had a PCN reaction causing severe rash involving mucus membranes or skin necrosis: No Has patient had a PCN reaction that required hospitalization: No Has patient had a PCN reaction occurring within the last 10 years: No If all of the above answers are "NO", then may proceed with Cephalosporin use.        Medication List        Accurate as of June 29, 2022  2:57 PM. If you have any questions, ask your nurse or doctor.          amLODipine 5 MG tablet Commonly known as: NORVASC TAKE 1 TABLET BY  MOUTH DAILY   amphetamine-dextroamphetamine 10 MG tablet Commonly known as: ADDERALL Take 10 mg by mouth daily.   baclofen 10 MG tablet Commonly known as: LIORESAL Take 10 mg by mouth 2 (two) times daily.   diazepam 10 MG tablet Commonly known as: VALIUM Take 10 mg by mouth in the morning, at noon, and at bedtime.   docusate sodium 100 MG capsule Commonly known as: COLACE Take 100 mg by mouth 2 (two) times daily.   DULoxetine 60 MG capsule Commonly known as: CYMBALTA Take 60 mg by mouth daily.   esomeprazole 40 MG capsule Commonly known as: NEXIUM TAKE 1 CAPSULE BY MOUTH TWICE  DAILY BEFORE A MEAL   ferrous sulfate 325 (65 FE) MG tablet Commonly known as: FerrouSul Take 1 tablet (325 mg  total) by mouth daily with breakfast.   fish oil-omega-3 fatty acids 1000 MG capsule Take 1 g by mouth daily.   gabapentin 600 MG tablet Commonly known as: NEURONTIN Take 600 mg by mouth 3 (three) times daily.   hydrochlorothiazide 12.5 MG capsule Commonly known as: MICROZIDE Take one tablet po daily   HYDROmorphone 4 MG tablet Commonly known as: DILAUDID Take 4 mg by mouth every 12 (twelve) hours as needed for severe pain.   modafinil 200 MG tablet Commonly known as: PROVIGIL Take 200-400 mg by mouth every morning.   multivitamin per tablet Take 1 tablet by mouth daily.   OCREVUS IV Inject into the vein. Infused every 6 months.   olopatadine 0.1 % ophthalmic solution Commonly known as: PATANOL Place 1 drop into both eyes 2 (two) times daily.   oxybutynin 5 MG tablet Commonly known as: DITROPAN Take 5 mg by mouth 3 (three) times daily.   polyethylene glycol powder 17 GM/SCOOP powder Commonly known as: GLYCOLAX/MIRALAX MIX 25.5 GRAMS WITH WATER OR JUICE ONCE DAILY.   potassium chloride 10 MEQ tablet Commonly known as: KLOR-CON TAKE 1 TABLET BY MOUTH TWICE  DAILY   sucralfate 1 g tablet Commonly known as: CARAFATE TAKE 1 TABLET BY MOUTH 4 TIMES  DAILY    tamsulosin 0.4 MG Caps capsule Commonly known as: FLOMAX Take 1 capsule (0.4 mg total) by mouth daily.   topiramate 100 MG tablet Commonly known as: TOPAMAX Take 100 mg by mouth daily.   traMADol 50 MG tablet Commonly known as: ULTRAM Take 100 mg by mouth every 6 (six) hours.   trimethoprim-polymyxin b ophthalmic solution Commonly known as: POLYTRIM Apply 1-2 drops into affected eye QID x 5 days.   Vitamin D (Ergocalciferol) 1.25 MG (50000 UNIT) Caps capsule Commonly known as: DRISDOL Take 50,000 Units by mouth every 7 (seven) days.         REVIEW OF SYSTEMS: Pertinent positives and negatives discussed in HPI.   Objective:   Physical Exam: BP (!) 136/90   Pulse (!) 125   Temp 98 F (36.7 C)   Resp 16   Ht 5' 9"$  (1.753 m)   Wt 208 lb 8 oz (94.6 kg)   SpO2 95%   BMI 30.79 kg/m  Body mass index is 30.79 kg/m. GEN: alert, well developed HEENT: clear conjunctiva, TM grey and translucent, nose with + inferior turbinate hypertrophy, pink nasal mucosa, slight clear rhinorrhea, + cobblestoning HEART: regular rate and rhythm, no murmur LUNGS: clear to auscultation bilaterally, no coughing, unlabored respiration ABDOMEN: soft, non distended  SKIN: no rashes or lesions  Reviewed:  01/19/2022: seen by Dr. Wolfgang Phoenix for 2 episodes of lip angioedema, initially thought to be related ACEI.  Started on Allegra and PRN benadryl.  Referred to Allergy.  03/06/2022: seen by Dr. Marla Roe Surgery s/p 1 year after large hiatal hernia and fundoplication.  Having dysphagia and significant reflux.  Unclear if this is motility issue due to her MS or anatomical issue with repair.   04/23/2022: seen by Neurology Dr. Felecia Shelling for multiple sclerosis.  On Ocrevus.  Serial MRI without new lesions. Unclear why she has ptosis, swallowing difficulty, dysarthria and proximal arm weakness.  Planning to look for myasthenia, CK ANA and possible nerve studies.  Myasthenia Ab was negative and CK was normal.    Assessment:   1. Angioedema, initial encounter   2. Chronic rhinitis   3. Rash and nonspecific skin eruption     Plan/Recommendations:  Chronic Rhinitis: -  Turbinate hypertrophy concerning for allergic triggers.  Will perform SPT at next visit.  - Use nasal saline rinses before nose sprays such as with Neilmed Sinus Rinse.  Use distilled water.   - Use Flonase 2 sprays each nostril daily. Aim upward and outward.  - Hold all anti-histamines 3 days prior to next visit for aeroallegen skin testing 1-59.  Idiopathic Urticaria/Angioedema (Hives/Swelling): - At this time etiology of swelling is unknown. Swelling can be caused by a variety of different triggers including illness/infection, exercise, pressure, vibrations, extremes of temperature to name a few however majority of the time there is no identifiable trigger.  In about 10% of patients, swelling can happen without the hives. Also, I am not sure if her rash is possibly hives.  -Keep a diary to track how often this happens and any changes in your routine.  -Hold all anti-histamines 3 days prior to next visit.  Afterwards, will discuss anti histamine step up regiment with Allegra/Pepcid.   Rash: - Unclear if this is hives but it is itchy and red, not raised.  Unclear frequency.  Keep track of how often this happens, if anything resolves it and how long it lasts.  Take pictures of the rash too.  - Do a daily soaking tub bath in warm water for 10-15 minutes.  - Use a gentle, unscented cleanser at the end of the bath (such as Dove unscented bar or baby wash, or Aveeno sensitive body wash). Then rinse, pat half-way dry, and apply a gentle, unscented moisturizer cream or ointment (Cerave, Cetaphil, Eucerin, Aveeno)  all over while still damp. The skin should be moisturized with a gentle, unscented moisturizer at least twice daily.  - Use only unscented liquid laundry detergent.  Return in about 1 week (around 07/06/2022).for skin  testing  Harlon Flor, MD [facility]

## 2022-06-30 ENCOUNTER — Other Ambulatory Visit: Payer: Self-pay | Admitting: Family Medicine

## 2022-06-30 DIAGNOSIS — E876 Hypokalemia: Secondary | ICD-10-CM

## 2022-07-02 ENCOUNTER — Telehealth: Payer: Self-pay | Admitting: Family Medicine

## 2022-07-02 ENCOUNTER — Ambulatory Visit (INDEPENDENT_AMBULATORY_CARE_PROVIDER_SITE_OTHER): Payer: Medicare Other | Admitting: Family Medicine

## 2022-07-02 ENCOUNTER — Encounter: Payer: Self-pay | Admitting: Family Medicine

## 2022-07-02 VITALS — BP 118/80 | HR 82 | Wt 207.8 lb

## 2022-07-02 DIAGNOSIS — G894 Chronic pain syndrome: Secondary | ICD-10-CM

## 2022-07-02 DIAGNOSIS — G35 Multiple sclerosis: Secondary | ICD-10-CM

## 2022-07-02 DIAGNOSIS — Z79891 Long term (current) use of opiate analgesic: Secondary | ICD-10-CM

## 2022-07-02 DIAGNOSIS — M62830 Muscle spasm of back: Secondary | ICD-10-CM | POA: Diagnosis not present

## 2022-07-02 DIAGNOSIS — Z5181 Encounter for therapeutic drug level monitoring: Secondary | ICD-10-CM

## 2022-07-02 MED ORDER — TRAMADOL HCL 50 MG PO TABS: ORAL_TABLET | ORAL | 3 refills | Status: DC

## 2022-07-02 MED ORDER — HYDROMORPHONE HCL 2 MG PO TABS: ORAL_TABLET | ORAL | 0 refills | Status: DC

## 2022-07-02 MED ORDER — MODAFINIL 200 MG PO TABS: ORAL_TABLET | ORAL | 3 refills | Status: DC

## 2022-07-02 NOTE — Telephone Encounter (Signed)
Nurses Please connect with the patient-nonemergent-please touch base with her to see if she has Narcan at home.  If not send in Narcan nasal spray Please let the patient know that this is standard customary for any person who is on significant amount of opioids to have on hand at home.  I did send her a MyChart message regarding this issue thank you

## 2022-07-02 NOTE — Progress Notes (Signed)
Subjective:    Patient ID: Natalie Campos, female    DOB: 08-06-1960, 62 y.o.   MRN: [id number]  HPI  Patient arrives to follow up medication.    This patient was seen today for chronic pain  The medication list was reviewed and updated.  Location of Pain for which the patient has been treated with regarding narcotics: Patient has MS.  She states she has a lot of pain in her spine as well as pains into the muscles of the arms and the legs.  She was under the care of neurologist for multiple years and through the course of those years was started on diazepam 10 mg initially it was 5 mg but over time it is gradually ramped up to 10 mg 4 times daily for muscle spasms and also baclofen twice a day she states without this she has severe dystonia spasms and inability to function.  She states with the medicine she is alert denies feeling drowsy during the day states she sleeps okay at night  As for the pain she typically takes tramadol she is already on gabapentin In addition to this she has been on hydromorphone 4 mg twice daily and a total of 300 mg of tramadol daily  Onset of this pain: Present for years   -Compliance with medication: Her husband states that he dispenses the medicine they deny drowsiness with the medicine  - Number patient states they take daily: As for the tramadol 5 or 6 every day as for hydromorphone 2 daily but over the past several weeks only 1 daily  -when was the last dose patient took?  Patient states yesterday on both medicines  The patient was advised the importance of maintaining medication and not using illegal substances with these.  Here for refills and follow up  The patient was educated that we can provide 3 monthly scripts for their medication, it is their responsibility to follow the instructions.  Side effects or complications from medications: Denies side effects with medicine  Patient is aware that pain medications are meant to minimize the  severity of the pain to allow their pain levels to improve to allow for better function. They are aware of that pain medications cannot totally remove their pain.  Due for UDT ( at least once per year) : Due today  Scale of 1 to 10 ( 1 is least 10 is most) Your pain level without the medicine: 9 Your pain level with medication 6  Scale 1 to 10 ( 1-helps very little, 10 helps very well) How well does your pain medication reduce your pain so you can function better through out the day? 5  Quality of the pain: Throbbing burning aching pain all over  Persistence of the pain: She relates all the time but worse with activity  Modifying factors: Worse with activity She also has history of daytime tiredness and fatigue her neurologist was treating her with Provigil she states that stimulates her to give her more energy she is able to function better during the day without it she feels like she is excessively fatigued and unable to do activity  We had a long discussion today talking about the safety of these combination of medicines I told her that I was not comfortable with prescribing these medications ongoing but at the same time would not leave her without medicines. She has an appointment with pain management in May.  I did inform her that it is quite possible pain management will  want her on a different regimen and that when they do so she will need to follow their directions.  We will not prescribe hydromorphone long-term  I did discuss with her that even though she seems to be tolerating the multiple different medicines that the combination of muscle relaxers with benzodiazepines with pain medication increases her risk of accidental overdose.  I also told her that most cases of accidental overdose is not intentional it just happens.  She was warned that if she feels drowsy with any of the medicines to avoid taking additional medicine and seek help  We will send in a prescription for Narcan for  her to have on hand  Please see discussion below Drug registry was checked  Pain contract reviewed patient initialed and signed          Review of Systems     Objective:   Physical Exam Lungs are clear hearts regular extremities trace edema in the lower legs.  Neurologic grossly normal today       Assessment & Plan:  1. Encounter for monitoring opioid maintenance therapy Very complex situation Very nice patient I am sympathetic to her situation but at the same time trying to be safe This patient was under the care of a local neurologist for years until that neurologist recently retired.  At the point of being retired she was on multiple medicines that are controlled and could interact together She takes diazepam 10 mg 3-4 times every single day to help with muscle spasms She takes baclofen twice a day to help with muscle spasms She also takes gabapentin 3 times daily and Cymbalta daily She also takes Topamax daily  In addition to this as per pain medicine tramadol 50 mg she takes 6/day essentially taking 2 at a time 3 times daily She also takes hydromorphone 4 mg twice daily but over the past couple weeks has been taking 4 mg 1/day but states her pain is been under terrible control  Drug registry was checked patient was educated regarding potential for accidental overdose We also had a long discussion about the possibility of tapering some of her medicines Understandably she does not want to hurt and is reluctant to taper medicine After significant discussion patient and her husband agrees to try to reduce diazepam to 10 mg 3 times daily instead of 4 times daily Also they agreed to try hydromorphone 2 mg 3 times daily rather than 4 mg twice daily They do have an appointment with pain management in early May we will see her back in April hopefully pain management will step up and take over a lot of this.  Given that I am not a neurologist and I do not treat MS and given  that the diazepam and baclofen she is using is related to her MS I am recommending that the diazepam and baclofen be under the care of of her new neurologist Dr. Felecia Shelling - [employer] Select 13 (MW), Urine  2. Multiple sclerosis ([place]) Sympathetic to the patient.  She needs to follow through with her neurologist on a regular basis We will communicate with neurology regarding this patient Hopefully neurology will assume the care of her and prescribe her diazepam and baclofen.  Certainly if neurology wants to work directly with the patient to alter this regimen Or Potentially Reduce the Medication will obviously be within their prerogative.  Patient will be noted that currently patient is very worried that any dramatic changes to her medicine regimen will decrease her quality  of life  The patient states that Provigil does help her with energy she has been on this for years this is a medication that we felt comfortable with prescribing for the patient so therefore we will go ahead with prescribing.  The patient states that her neurologist was also prescribing Adderall to take at noon to help her focus and have more energy but I told her that I did not feel that would be wise or safe we will hold off on any Adderall and I recommended stopping-she has not had any recently  3. Chronic pain syndrome Please see discussion above  40 additional time was spent documenting and reviewing records minutes spent with patient  4. Spasm of back muscles Massage was recommended As for the MS muscle spasms diazepam and baclofen but please see discussion above  Patient will follow-up in April  UDT pending Will send communication to neurologist

## 2022-07-02 NOTE — Telephone Encounter (Signed)
Hi Rich-I appreciate your care of this patient for MS.  We are in the process of getting her started up with pain management and in the meantime we are helping with managing of her pain management as well as her Provigil.  As I told the patient managing muscle spasms for MS is not something I feel comfortable doing and given that I am not doing the direct MS care I would recommend that she get her refills of diazepam and baclofen through you.  I also told her that there is a possibility you may decide to adjust her regimen regarding muscle spasms.  She is a very complex patient, I am sympathetic to her issues but at the same time worry about poly medicine.  I will keep you posted of any changes regarding her pain management-thanks for your care-[facility]

## 2022-07-03 MED ORDER — NALOXONE HCL 4 MG/0.1ML NA LIQD: NASAL | 0 refills | Status: DC

## 2022-07-03 NOTE — Telephone Encounter (Signed)
Pt contacted and states she does not have Narcan on hand at home. Narcan sent to [facility]. Pt verbalized understanding

## 2022-07-05 LAB — TOXASSURE SELECT 13 (MW), URINE

## 2022-07-06 ENCOUNTER — Ambulatory Visit: Payer: Medicare Other | Admitting: Internal Medicine

## 2022-07-10 ENCOUNTER — Other Ambulatory Visit: Payer: Self-pay | Admitting: Family Medicine

## 2022-07-10 MED ORDER — HYDROMORPHONE HCL 4 MG PO TABS: ORAL_TABLET | ORAL | 0 refills | Status: DC

## 2022-07-13 ENCOUNTER — Encounter: Payer: Self-pay | Admitting: Internal Medicine

## 2022-07-13 ENCOUNTER — Other Ambulatory Visit: Payer: Self-pay

## 2022-07-13 ENCOUNTER — Ambulatory Visit (INDEPENDENT_AMBULATORY_CARE_PROVIDER_SITE_OTHER): Payer: Medicare Other | Admitting: Internal Medicine

## 2022-07-13 VITALS — BP 138/60 | HR 104 | Temp 98.3°F | Resp 16

## 2022-07-13 DIAGNOSIS — L501 Idiopathic urticaria: Secondary | ICD-10-CM | POA: Diagnosis not present

## 2022-07-13 DIAGNOSIS — J3089 Other allergic rhinitis: Secondary | ICD-10-CM | POA: Diagnosis not present

## 2022-07-13 DIAGNOSIS — J302 Other seasonal allergic rhinitis: Secondary | ICD-10-CM

## 2022-07-13 MED ORDER — FAMOTIDINE 20 MG PO TABS: 20.0000 mg | ORAL_TABLET | Freq: Two times a day (BID) | ORAL | 5 refills | Status: DC | PRN

## 2022-07-13 MED ORDER — FEXOFENADINE HCL 180 MG PO TABS: 180.0000 mg | ORAL_TABLET | Freq: Two times a day (BID) | ORAL | 5 refills | Status: DC | PRN

## 2022-07-13 MED ORDER — AZELASTINE HCL 0.1 % NA SOLN: NASAL | 5 refills | Status: DC

## 2022-07-13 NOTE — Patient Instructions (Addendum)
Allergic Rhinitis: - Positive skin test 07/2022: grasses, mold, dust mite, cat, horse - Avoidance measures discussed. - Use nasal saline rinses before nose sprays such as with Neilmed Sinus Rinse.  Use distilled water.   - Use Flonase 2 sprays each nostril daily. Aim upward and outward. - Use Azelastine 1-2 sprays each nostril twice daily as needed. Aim upward and outward. - Use Allegra '180mg'$  daily.   Idiopathic Urticaria/Angioedema (Hives/Swelling): - At this time etiology of swelling is unknown. Swelling can be caused by a variety of different triggers including illness/infection, exercise, pressure, vibrations, extremes of temperature to name a few however majority of the time there is no identifiable trigger.  In about 10% of patients, swelling can happen without the hives.  Also, I am not sure if her rash is urticarial or not.  -Keep a diary to track how often this happens and any changes in your routine. Take pictures  -Start Allegra '180mg'$  daily.   -If rash/swelling reoccurs, increase to Allegra '180mg'$  twice daily.   -If rash/swelling still reoccurs, add Pepcid '20mg'$  twice daily and continue Allegra '180mg'$  twice daily.   Rash: - Do a daily soaking tub bath in warm water for 10-15 minutes.  - Use a gentle, unscented cleanser at the end of the bath (such as Dove unscented bar or baby wash, or Aveeno sensitive body wash). Then rinse, pat half-way dry, and apply a gentle, unscented moisturizer cream or ointment (Cerave, Cetaphil, Eucerin, Aveeno)  all over while still damp. The skin should be moisturized with a gentle, unscented moisturizer at least twice daily.  - Use only unscented liquid laundry detergent. - Take pictures of rashes.

## 2022-07-13 NOTE — Progress Notes (Signed)
FOLLOW UP Date of Service/Encounter:  07/13/22   Subjective:  Natalie Campos (DOB: 1960/07/01) is a 62 y.o. female who returns to the [facility] on 07/13/2022 for follow up for skin testing.   History obtained from: chart review and patient.  Doing well, had one episode with red rash on her neck and slight swelling but no other issues. Usually takes benadryl as needed and it helps.  Denies any itching with the rash.  States this happens intermittently, sometimes not for months.    Past Medical History: Past Medical History:  Diagnosis Date   Anemia    Angio-edema    Anxiety    Barrett's esophagus    Bulging discs    Cataract    Complication of anesthesia    Complication of anesthesia    patietn wakes up easily- has anxiety due to waking up during a simple procedure   Depression    DJD (degenerative joint disease)    Dog bite(E906.0) 12/06/2009   Eczema    GERD (gastroesophageal reflux disease)    High triglycerides    Hyperlipidemia    Hypertension    IBS (irritable bowel syndrome)    contipation predominant    Iron deficiency anemia    Multiple sclerosis ([place]) 05/11/1988   Dx in 1990 most recent hospitalization for a flareis in 2011    Obesity     Objective:  BP 138/60   Pulse (!) 104   Temp 98.3 F (36.8 C)   Resp 16   SpO2 97%  There is no height or weight on file to calculate BMI. Physical Exam: GEN: alert, well developed HEENT: clear conjunctiva, MMM HEART: regular rate and rhythm, no murmur LUNGS: clear to auscultation bilaterally, no coughing, unlabored respiration SKIN: no rashes or lesions  Skin Testing:  Skin prick testing was placed, which includes aeroallergens/foods, histamine control, and saline control.  Verbal consent was obtained prior to placing test.  Patient tolerated procedure well.  Allergy testing results were read and interpreted by myself, documented by clinical staff. Adequate positive and negative control.   Positive results to:  Results discussed with patient/family.  Airborne Adult Perc - 07/13/22 1346     Time Antigen Placed 1346    Allergen Manufacturer Lavella Hammock    Location Back    Number of Test 59    1. Control-Buffer 50% Glycerol Negative    2. Control-Histamine 1 mg/ml 3+    3. Albumin saline Negative    4. [place] 3+    5. [place] 3+    6. [place] Negative    7. [place] Blue 3+    8. Meadow Fescue 3+    9. Perennial Rye 3+    10. Sweet Vernal 3+    11. Timothy 3+    12. Cocklebur Negative    13. Burweed Marshelder Negative    14. Ragweed, short Negative    15. Ragweed, Giant Negative    16. Plantain,  English Negative    17. Lamb's Quarters Negative    18. Sheep Sorrell Negative    19. Rough Pigweed Negative    20. Marsh Elder, Rough Negative    21. Mugwort, Common Negative    22. Ash mix Negative    23. Birch mix Negative    24. Beech American Negative    25. Box, Elder Negative    26. Cedar, red Negative    27. Cottonwood, [place] Negative    28. Elm mix Negative    29.  Hickory Negative    30. Maple mix Negative    31. Oak, [place] mix Negative    32. Pecan Pollen Negative    33. Pine mix Negative    34. Sycamore Eastern Negative    35. Cedar [place], Black Pollen Negative    36. Alternaria alternata Negative    37. Cladosporium Herbarum Negative    38. Aspergillus mix Negative    39. Penicillium mix Negative    40. Bipolaris sorokiniana (Helminthosporium) Negative    41. Drechslera spicifera (Curvularia) Negative    42. Mucor plumbeus Negative    43. Fusarium moniliforme Negative    44. Aureobasidium pullulans (pullulara) Negative    45. Rhizopus oryzae Negative    46. Botrytis cinera Negative    47. Epicoccum nigrum Negative    48. Phoma betae Negative    49. Candida Albicans Negative    50. Trichophyton mentagrophytes 3+    51. Mite, D Farinae  5,000 AU/ml 3+    52. Mite, D Pteronyssinus  5,000 AU/ml 3+    53. Cat Hair 10,000 BAU/ml Negative    54.  Dog  Epithelia Negative    55. Mixed Feathers Negative    56. Horse Epithelia 3+    57. Cockroach, German Negative    58. Mouse Negative    59. Tobacco Leaf Negative              Assessment:   1. Seasonal and perennial allergic rhinitis   2. Idiopathic urticaria     Plan/Recommendations:  Allergic Rhinitis: - Due to turbinate hypertrophy, seasonal symptoms and unresponsive to OTC meds, performed skin testing to identify aeroallergen triggers.   - Positive skin test 07/2022: grasses, mold, dust mite, cat, horse - Avoidance measures discussed. - Use nasal saline rinses before nose sprays such as with Neilmed Sinus Rinse.  Use distilled water.   - Use Flonase 2 sprays each nostril daily. Aim upward and outward. - Use Azelastine 1-2 sprays each nostril twice daily as needed. Aim upward and outward. - Use Allegra '180mg'$  daily.   Idiopathic Urticaria/Angioedema (Hives/Swelling): - At this time etiology of swelling is unknown. Swelling can be caused by a variety of different triggers including illness/infection, exercise, pressure, vibrations, extremes of temperature to name a few however majority of the time there is no identifiable trigger.  In about 10% of patients, swelling can happen without the hives.  Also, I am not sure if her rash is urticarial or not.  -Keep a diary to track how often this happens and any changes in your routine. Take pictures  -Start Allegra '180mg'$  daily.   -If rash/swelling reoccurs, increase to Allegra '180mg'$  twice daily.   -If rash/swelling still reoccurs, add Pepcid '20mg'$  twice daily and continue Allegra '180mg'$  twice daily.  Rash: - Do a daily soaking tub bath in warm water for 10-15 minutes.  - Use a gentle, unscented cleanser at the end of the bath (such as Dove unscented bar or baby wash, or Aveeno sensitive body wash). Then rinse, pat half-way dry, and apply a gentle, unscented moisturizer cream or ointment (Cerave, Cetaphil, Eucerin, Aveeno)  all over while  still damp. The skin should be moisturized with a gentle, unscented moisturizer at least twice daily.  - Use only unscented liquid laundry detergent. - Take pictures of rashes.      Return in about 2 months (around 09/12/2022).  Harlon Flor, MD [facility]

## 2022-07-16 ENCOUNTER — Encounter: Payer: Self-pay | Admitting: Family Medicine

## 2022-07-20 ENCOUNTER — Ambulatory Visit: Payer: Medicare Other | Admitting: Family Medicine

## 2022-07-31 ENCOUNTER — Other Ambulatory Visit: Payer: Self-pay | Admitting: Family Medicine

## 2022-07-31 ENCOUNTER — Ambulatory Visit (INDEPENDENT_AMBULATORY_CARE_PROVIDER_SITE_OTHER): Payer: Medicare Other | Admitting: Family Medicine

## 2022-07-31 VITALS — BP 132/80 | Ht 69.0 in | Wt 211.8 lb

## 2022-07-31 DIAGNOSIS — Z5181 Encounter for therapeutic drug level monitoring: Secondary | ICD-10-CM | POA: Diagnosis not present

## 2022-07-31 DIAGNOSIS — Z79891 Long term (current) use of opiate analgesic: Secondary | ICD-10-CM | POA: Diagnosis not present

## 2022-07-31 DIAGNOSIS — G894 Chronic pain syndrome: Secondary | ICD-10-CM

## 2022-07-31 MED ORDER — HYDROMORPHONE HCL 2 MG PO TABS: ORAL_TABLET | ORAL | 0 refills | Status: DC

## 2022-07-31 NOTE — Progress Notes (Signed)
Subjective:    Patient ID: Natalie Campos, female    DOB: 11/06/1960, 62 y.o.   MRN: [id number]  HPI  Patient arrives for a follow up on blood pressure and pain medications follow up- patient states it was hard to cut the 4mg  but found ou the pharmacy just got in the 2 mg Encounter for monitoring opioid maintenance therapy  Chronic pain syndrome This patient was seen today for chronic pain  The medication list was reviewed and updated.  Location of Pain for which the patient has been treated with regarding narcotics: She has pain mainly in her lower legs and also pain in her back associated with MS as well as chronic back disease  Onset of this pain: She has had this for years   -Compliance with medication: She has been on medication for multiple years by a local neurologist who also did pain management but this neurologist retired and the patient was without coverage and we are trying to help her  - Number patient states they take daily: She was taken Dilaudid 4 mg multiple tablets per day we have reduced her down to 2 mg 3 times daily she has done very well with cutting back  -when was the last dose patient took?  Yesterday  The patient was advised the importance of maintaining medication and not using illegal substances with these.  Here for refills and follow up  The patient was educated that we can provide 3 monthly scripts for their medication, it is their responsibility to follow the instructions.  Side effects or complications from medications: She is up-to-date on urine drug screen no side effects with medicine  Patient is aware that pain medications are meant to minimize the severity of the pain to allow their pain levels to improve to allow for better function. They are aware of that pain medications cannot totally remove their pain.  Due for UDT ( at least once per year) : Up-to-date  Scale of 1 to 10 ( 1 is least 10 is most) Your pain level without the medicine:  8 Your pain level with medication 4  Scale 1 to 10 ( 1-helps very little, 10 helps very well) How well does your pain medication reduce your pain so you can function better through out the day? 8  Quality of the pain: Burning aching throbbing  Persistence of the pain: Present all the time  Modifying factors: Worse with activity      Review of Systems     Objective:   Physical Exam General-in no acute distress Eyes-no discharge Lungs-respiratory rate normal, CTA CV-no murmurs,RRR Extremities skin warm dry no edema Neuro grossly normal Behavior normal, alert Subjective discomfort lower back and into the legs       Assessment & Plan:  1. Encounter for monitoring opioid maintenance therapy The patient was seen in followup for chronic pain. A review over at their current pain status was discussed. Drug registry was checked. Prescriptions were given.  Regular follow-up recommended. Discussion was held regarding the importance of compliance with medication as well as pain medication contract.  Patient was informed that medication may cause drowsiness and should not be combined  with other medications/alcohol or street drugs. If the patient feels medication is causing altered alertness then do not drive or operate dangerous equipment.  Should be noted that the patient appears to be meeting appropriate use of opioids and response.  Evidenced by improved function and decent pain control without significant side effects and  no evidence of overt aberrancy issues.  Upon discussion with the patient today they understand that opioid therapy is optional and they feel that the pain has been refractory to reasonable conservative measures and is significant and affecting quality of life enough to warrant ongoing therapy and wishes to continue opioids.  Refills were provided.   2. Chronic pain syndrome 2 prescriptions were sent in for the 2 mg she will take 1 3 times daily.  She will  continue her tramadol She has an appointment with pain management later in May hopefully they will take over management of her opioids if not she would need to follow-up with [place] She has been cautioned to cut back on the Valium she takes several per day she is getting this through neurology This is a less than optimal situation but we have been successful in tapering down some of her medicines. She is a nice lady who has underlying health issues that she cannot help

## 2022-08-12 DIAGNOSIS — L110 Acquired keratosis follicularis: Secondary | ICD-10-CM | POA: Diagnosis not present

## 2022-08-12 DIAGNOSIS — M79675 Pain in left toe(s): Secondary | ICD-10-CM | POA: Diagnosis not present

## 2022-08-12 DIAGNOSIS — M79674 Pain in right toe(s): Secondary | ICD-10-CM | POA: Diagnosis not present

## 2022-08-12 DIAGNOSIS — I739 Peripheral vascular disease, unspecified: Secondary | ICD-10-CM | POA: Diagnosis not present

## 2022-08-12 DIAGNOSIS — M79671 Pain in right foot: Secondary | ICD-10-CM | POA: Diagnosis not present

## 2022-08-12 DIAGNOSIS — M79672 Pain in left foot: Secondary | ICD-10-CM | POA: Diagnosis not present

## 2022-08-12 DIAGNOSIS — L609 Nail disorder, unspecified: Secondary | ICD-10-CM | POA: Diagnosis not present

## 2022-08-13 ENCOUNTER — Encounter (INDEPENDENT_AMBULATORY_CARE_PROVIDER_SITE_OTHER): Payer: Self-pay

## 2022-08-17 ENCOUNTER — Encounter (INDEPENDENT_AMBULATORY_CARE_PROVIDER_SITE_OTHER): Payer: Self-pay | Admitting: Gastroenterology

## 2022-08-17 ENCOUNTER — Ambulatory Visit (INDEPENDENT_AMBULATORY_CARE_PROVIDER_SITE_OTHER): Payer: Medicare Other | Admitting: Gastroenterology

## 2022-08-24 DIAGNOSIS — Z1231 Encounter for screening mammogram for malignant neoplasm of breast: Secondary | ICD-10-CM | POA: Diagnosis not present

## 2022-09-08 ENCOUNTER — Other Ambulatory Visit: Payer: Self-pay | Admitting: Family Medicine

## 2022-09-14 ENCOUNTER — Ambulatory Visit: Payer: Medicare Other | Admitting: Internal Medicine

## 2022-09-15 DIAGNOSIS — M545 Low back pain, unspecified: Secondary | ICD-10-CM | POA: Diagnosis not present

## 2022-09-15 DIAGNOSIS — G894 Chronic pain syndrome: Secondary | ICD-10-CM | POA: Diagnosis not present

## 2022-09-15 DIAGNOSIS — M25561 Pain in right knee: Secondary | ICD-10-CM | POA: Diagnosis not present

## 2022-09-15 DIAGNOSIS — Z79899 Other long term (current) drug therapy: Secondary | ICD-10-CM | POA: Diagnosis not present

## 2022-09-15 DIAGNOSIS — G35 Multiple sclerosis: Secondary | ICD-10-CM | POA: Diagnosis not present

## 2022-09-15 DIAGNOSIS — M542 Cervicalgia: Secondary | ICD-10-CM | POA: Diagnosis not present

## 2022-09-15 DIAGNOSIS — M25562 Pain in left knee: Secondary | ICD-10-CM | POA: Diagnosis not present

## 2022-09-15 DIAGNOSIS — Z79891 Long term (current) use of opiate analgesic: Secondary | ICD-10-CM | POA: Diagnosis not present

## 2022-09-21 ENCOUNTER — Other Ambulatory Visit: Payer: Self-pay | Admitting: *Deleted

## 2022-09-28 ENCOUNTER — Other Ambulatory Visit: Payer: Self-pay | Admitting: Family Medicine

## 2022-09-28 ENCOUNTER — Telehealth: Payer: Self-pay

## 2022-09-28 NOTE — Telephone Encounter (Signed)
Prescription Request  09/28/2022  LOV: Visit date not found  What is the name of the medication or equipment? diazepam (VALIUM) 10 MG tablet   Have you contacted your pharmacy to request a refill? Yes   Which pharmacy would you like this sent to?   [facility]    Patient notified that their request is being sent to the clinical staff for review and that they should receive a response within 2 business days.   Please advise at Mobile [phone] (mobile)

## 2022-09-29 ENCOUNTER — Other Ambulatory Visit: Payer: Self-pay | Admitting: Family Medicine

## 2022-09-29 NOTE — Telephone Encounter (Signed)
Left message to return call 

## 2022-09-29 NOTE — Telephone Encounter (Signed)
Very nice patient yet very challenging circumstances Her neurologist had her on significant level of pain medicines and nerve medicines When the patient was here previously we did discuss the importance of trying to cut back on the Valium to a lower dose rather than taking 10 mg 3-4 times a day I recommended cutting that in half for the 5 mg We need to know several things #1 in regards to her value has she cut back on any?  How is she taking it currently what mg and how often? #2 in regards to pain management how she is taking her pain medications? (The above answers will help with doing regarding additional refills plus also will help need to know when we need to bring her back and she has an appointment at the end of July but we may need to see her sooner)

## 2022-09-30 ENCOUNTER — Encounter: Payer: Self-pay | Admitting: Family Medicine

## 2022-09-30 ENCOUNTER — Telehealth: Payer: Self-pay

## 2022-09-30 NOTE — Telephone Encounter (Signed)
See telephone message

## 2022-09-30 NOTE — Telephone Encounter (Signed)
Spoke with patient she states she has not been able to reduce the valium intake due to spasm with MS. Previously given by Dr Judithann Sheen to take Q 6 hrs PRN. Has appt with [facility] neurology , Ihor Dow on 10/29/22. She is going to pain management in [place] . Pain and spine in [place].

## 2022-10-01 ENCOUNTER — Telehealth: Payer: Self-pay | Admitting: Family Medicine

## 2022-10-01 ENCOUNTER — Other Ambulatory Visit: Payer: Self-pay | Admitting: Family Medicine

## 2022-10-01 ENCOUNTER — Encounter: Payer: Self-pay | Admitting: Family Medicine

## 2022-10-01 MED ORDER — DIAZEPAM 10 MG PO TABS: ORAL_TABLET | ORAL | 2 refills | Status: DC

## 2022-10-01 NOTE — Telephone Encounter (Signed)
Hi Natalie Campos [name] will be seeing this patient in June  I have worked with her this spring to try to cut down on some of her pain medicines as well as benzodiazepines  As you can be imagine this is not always an easy process  As for her diazepam-she was taking 10 mg 4 times daily We have gotten her down to the equivalent of 10 mg taking essentially 2-1/2 tablets/day spaced out throughout the day  When you see her in June please review over her medication regimen and please let me know if this is something you are okay with or feel needs to be adjusted differently.  Currently I am okay with prescribing this as long as you are on board with this usage (typically I do not utilize benzodiazepines with pain medicines but in her situation because of MS and muscle spasms you and I communicated earlier this spring that that is a common usage)  Thanks again for your time and appreciate you seeing her in June and I will be seeing her in July thank you-Malary Aylesworth

## 2022-10-01 NOTE — Telephone Encounter (Signed)
Should be noted that I did send in the medication that you [facility] I will send the patient a MyChart message

## 2022-10-02 ENCOUNTER — Telehealth: Payer: Self-pay | Admitting: Family Medicine

## 2022-10-02 DIAGNOSIS — G894 Chronic pain syndrome: Secondary | ICD-10-CM

## 2022-10-02 DIAGNOSIS — Z5181 Encounter for therapeutic drug level monitoring: Secondary | ICD-10-CM

## 2022-10-02 NOTE — Telephone Encounter (Signed)
Patient is requesting referral to another pain management due to the one in [place] out of [place] she can't get her medication filled. Please advise

## 2022-10-05 NOTE — Telephone Encounter (Signed)
Nurses Please call patient please find out specifically about the details of this? Our hopes was that her pain management in [place] would handle this but obviously there has been an issue If possible try to get a copy of the notes from the pain management people in need and Also talked with patient to get more details Then we can decide where we need to refer her (There is a strong probability that the patient will need to understand that pain management may put her on different medicines for her pain than what she has been used to in the past.  Her current regimen is not something that we can take responsibility for long-term)

## 2022-10-06 IMAGING — MR MR CERVICAL SPINE WO/W CM
7 of 8 series · 31 of 48 positions shown · IV contrast (gadavist)
Comparison: MRI of the cervical spine October 30, 2016.

CLINICAL DATA: Multiple sclerosis (HCC) G35 ([id number]).

EXAM:
MRI CERVICAL SPINE WITHOUT AND WITH CONTRAST
TECHNIQUE: Multiplanar and multiecho pulse sequences of the cervical spine, to
include the craniocervical junction and cervicothoracic junction,
were obtained without and with intravenous contrast.
CONTRAST:  10mL GADAVIST GADOBUTROL 1 MMOL/ML IV SOLN

[Series 5: T1 · sagittal · 3.0mm · 0.86mm/px · 3 of 15 slices shown (1 of 2)]
[im 1/15]
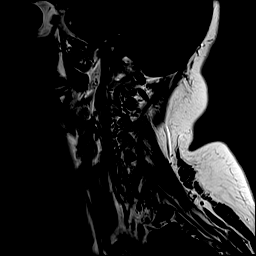
[im 8/15]
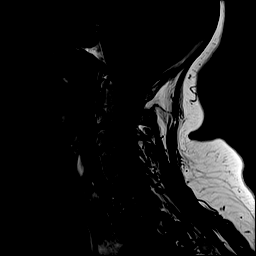
[im 15/15]
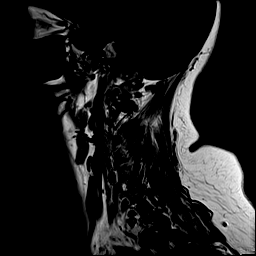

[Series 6: STIR · sagittal · 3.0mm · 0.69mm/px · 3 of 15 slices shown]
[im 1/15]
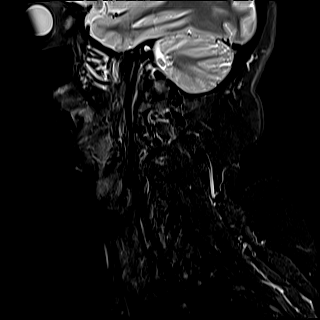
[im 8/15]
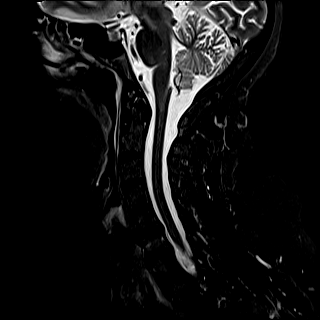
[im 15/15]
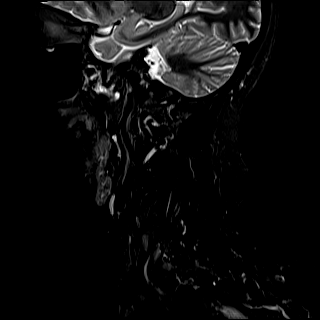

[Series 7: T2 · axial · 3.0mm · 0.70mm/px · z∈[-244,-119]mm · 9 of 40 slices shown (1 of 2)]
[im 1/40]
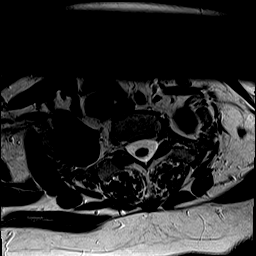
[im 5/40]
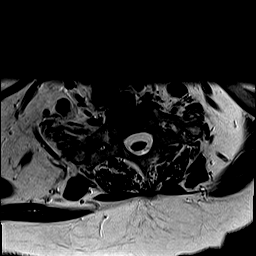
[im 10/40]
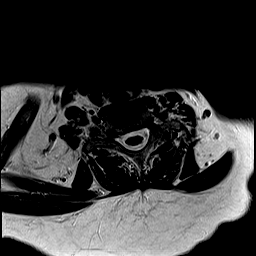
[im 15/40]
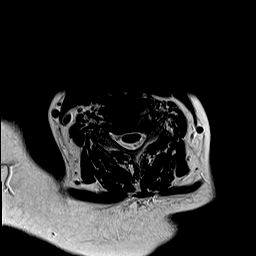
[im 20/40]
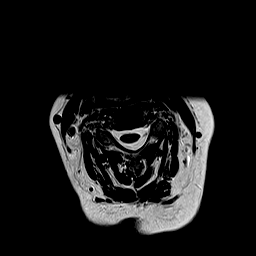
[im 25/40]
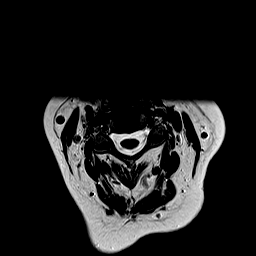
[im 30/40]
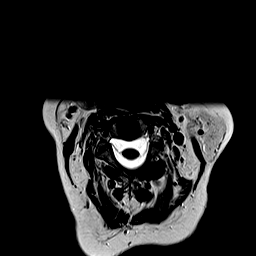
[im 35/40]
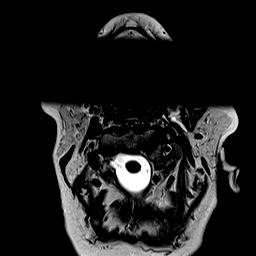
[im 40/40]
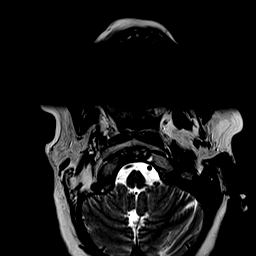

[Series 9: T1 · axial · 3.0mm · 0.35mm/px · z∈[-244,-119]mm · 9 of 40 slices shown (2 of 2)]
[im 1/40]
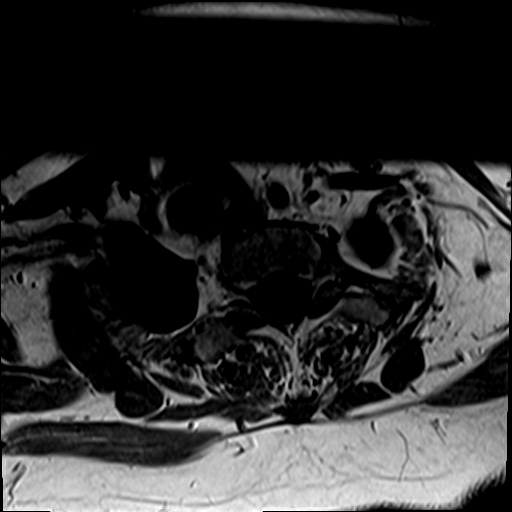
[im 5/40]
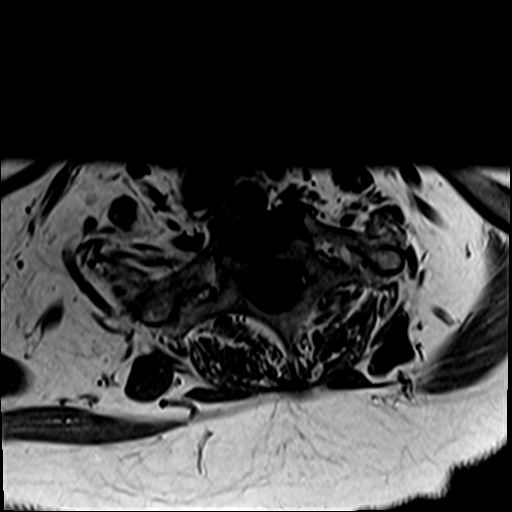
[im 10/40]
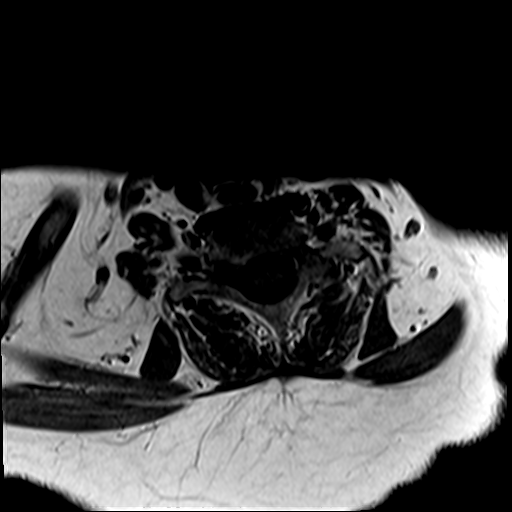
[im 15/40]
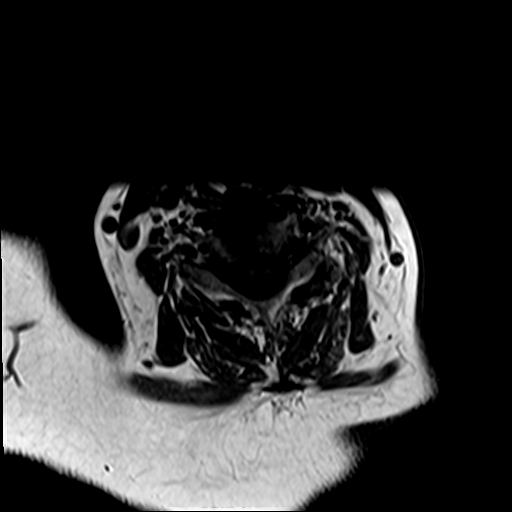
[im 20/40]
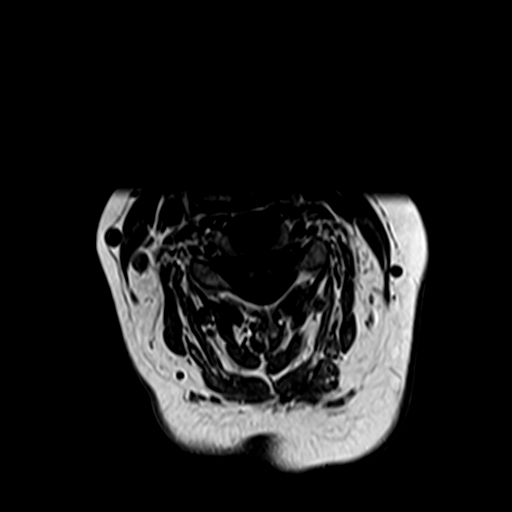
[im 25/40]
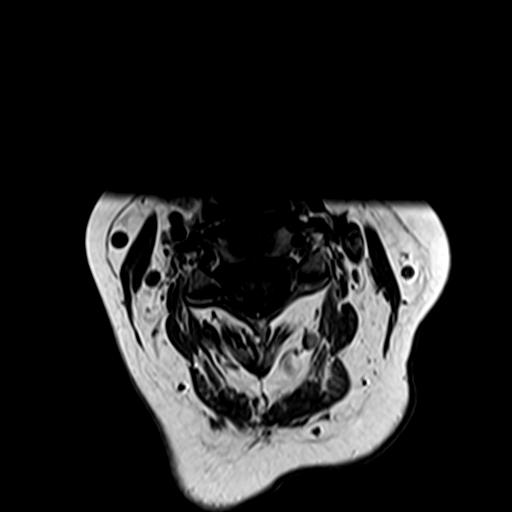
[im 30/40]
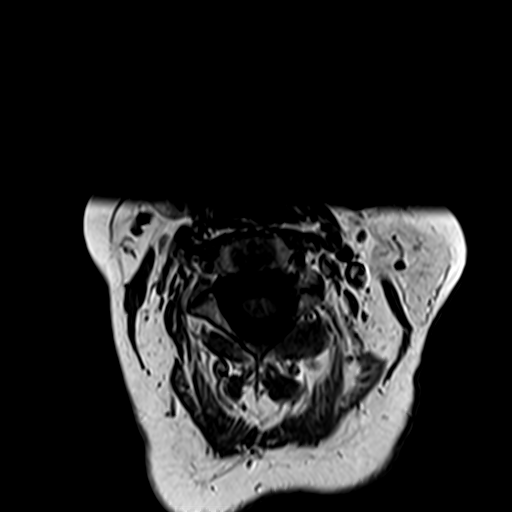
[im 35/40]
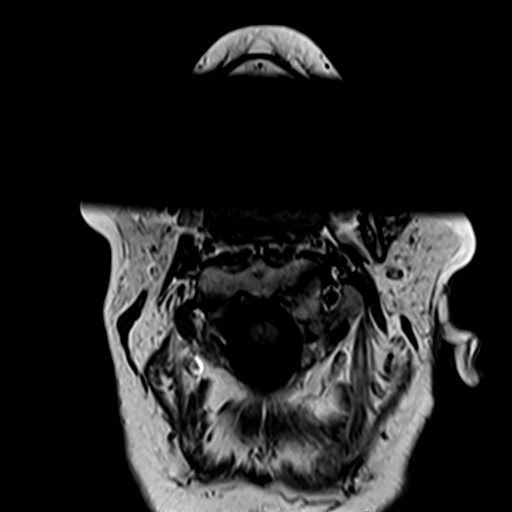
[im 40/40]
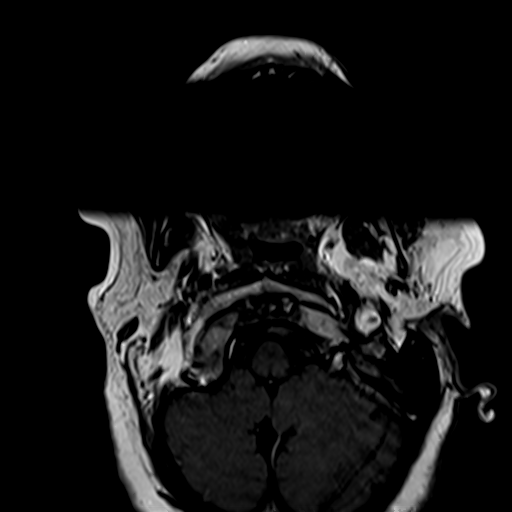

[Series 10: T2 · sagittal · 3.0mm · 0.69mm/px · 3 of 15 slices shown (2 of 2)]
[im 1/15]
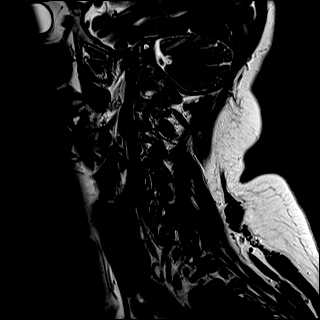
[im 8/15]
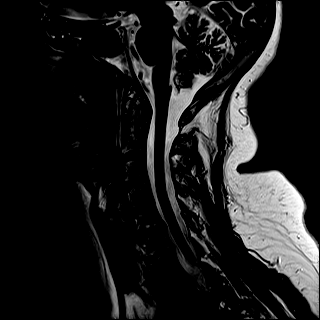
[im 15/15]
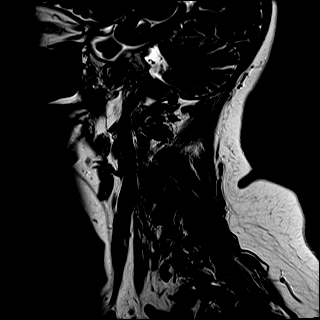

[Series 11: T1 fat-sat post-contrast · sagittal · 3.0mm · 0.43mm/px · 3 of 15 slices shown]
[im 1/15]
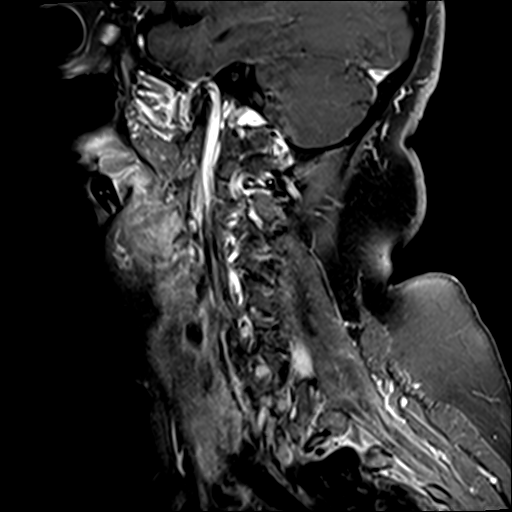
[im 8/15]
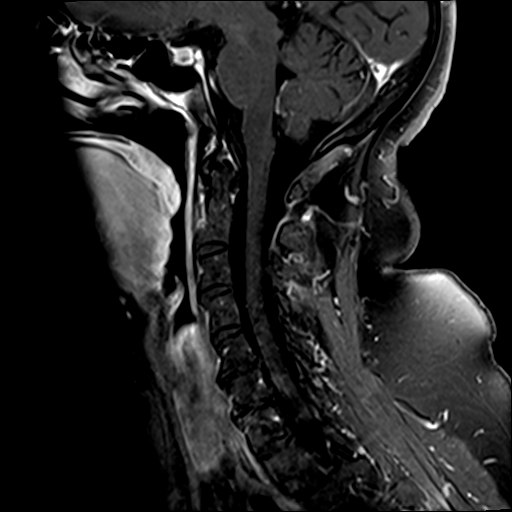
[im 15/15]
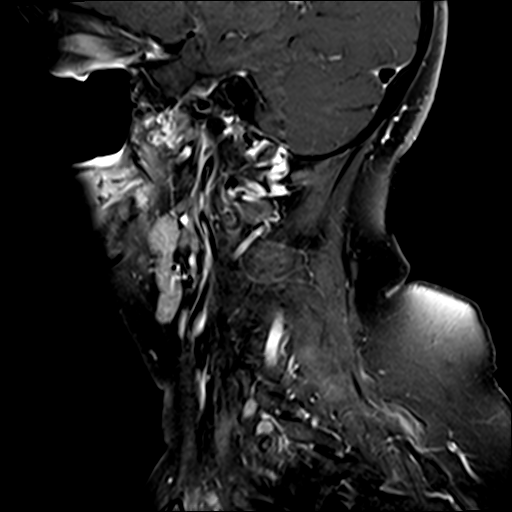

[Series 12: T1 post-contrast · axial · 3.0mm · 0.35mm/px · 1 of 40 slices shown]
[im 1/40]
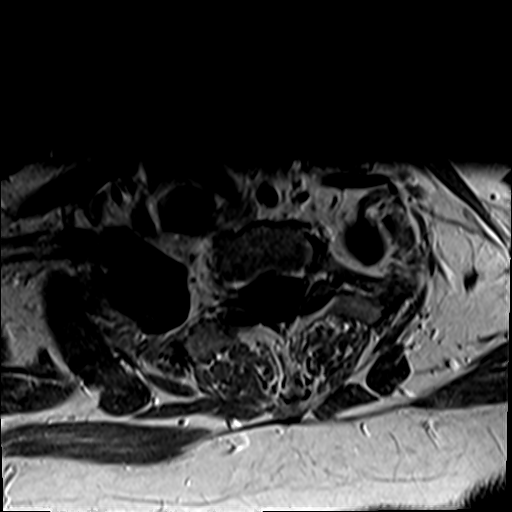

[31 of 48 positions shown; findings below may reference images not displayed]

FINDINGS: Alignment: Physiologic.

Vertebrae: No fracture, evidence of discitis, or bone lesion.
Endplate degenerative changes at C5-6 and C6-7

Cord: Normal signal and morphology. No focus of abnormal contrast
enhancement.

Posterior Fossa, vertebral arteries, paraspinal tissues: Negative.

Disc levels:

C2-3: No spinal canal or neural foraminal stenosis.

C3-4:No spinal canal or neural foraminal stenosis.

C4-5: No spinal canal or neural foraminal stenosis.

C5-6:Posterior disc osteophyte complex without significant spinal
canal stenosis. Uncovertebral and facet degenerative changes
resulting mild right and severe left neural foraminal narrowing,
unchanged.

C6-7: Posterior disc osteophyte complex without significant spinal
canal stenosis. Uncovertebral and facet degenerative changes
resulting in severe right and mild left neural foraminal narrowing,
unchanged.

C7-T1: No spinal canal or neural foraminal stenosis.
IMPRESSION: 1. No spinal cord lesion identified.
2. Degenerative changes of the cervical spine with severe left
neural foraminal narrowing at C5-6 and severe right neural foraminal
narrowing at C6-7.

## 2022-10-06 NOTE — Telephone Encounter (Signed)
Nurses That is certainly unfortunate I would recommend several things 1.  Referral to [facility] for pain management 2.  Also recommend a virtual visit with myself or in person with myself on Thursday so that we can do her pain medications and discuss this topic in further detail

## 2022-10-06 NOTE — Telephone Encounter (Signed)
Patient stated she was seen in place in [place] and they did a urine and put her in a room and she saw the Dr on a screen who was in [place]. She said they did not like her being on 2 short term meds for pain and we sending in a script for a long acting medication to take its place. Patient said afer several weeks the script was sent in but [facility] will not honor it. Spoke with pharmacy at [facility] and they stated they do not honor scripts from that office dut to the level of the narcotics and they know the patients are not seen in person Patient states she will be out on meds on June 1st and didn't know what to do - stated she will need a referral to a different pain management center and is ok with [facility]

## 2022-10-06 NOTE — Telephone Encounter (Signed)
Left message for a return call for more details to previous call.

## 2022-10-08 NOTE — Telephone Encounter (Signed)
That sounds good we will see how that goes

## 2022-10-08 NOTE — Telephone Encounter (Signed)
Patient states she has an appointment with [facility] pain management tomorrow afternoon

## 2022-10-13 DIAGNOSIS — D539 Nutritional anemia, unspecified: Secondary | ICD-10-CM | POA: Diagnosis not present

## 2022-10-13 DIAGNOSIS — E7800 Pure hypercholesterolemia, unspecified: Secondary | ICD-10-CM | POA: Diagnosis not present

## 2022-10-13 DIAGNOSIS — Z1159 Encounter for screening for other viral diseases: Secondary | ICD-10-CM | POA: Diagnosis not present

## 2022-10-13 DIAGNOSIS — E559 Vitamin D deficiency, unspecified: Secondary | ICD-10-CM | POA: Diagnosis not present

## 2022-10-13 DIAGNOSIS — Z0000 Encounter for general adult medical examination without abnormal findings: Secondary | ICD-10-CM | POA: Diagnosis not present

## 2022-10-13 DIAGNOSIS — Z131 Encounter for screening for diabetes mellitus: Secondary | ICD-10-CM | POA: Diagnosis not present

## 2022-10-13 DIAGNOSIS — G35 Multiple sclerosis: Secondary | ICD-10-CM | POA: Diagnosis not present

## 2022-10-13 DIAGNOSIS — M129 Arthropathy, unspecified: Secondary | ICD-10-CM | POA: Diagnosis not present

## 2022-10-13 DIAGNOSIS — Z79899 Other long term (current) drug therapy: Secondary | ICD-10-CM | POA: Diagnosis not present

## 2022-10-13 DIAGNOSIS — R5383 Other fatigue: Secondary | ICD-10-CM | POA: Diagnosis not present

## 2022-10-15 ENCOUNTER — Encounter (HOSPITAL_COMMUNITY): Admission: RE | Admit: 2022-10-15 | Payer: Medicare Other | Source: Ambulatory Visit

## 2022-10-15 DIAGNOSIS — Z79899 Other long term (current) drug therapy: Secondary | ICD-10-CM | POA: Diagnosis not present

## 2022-10-19 NOTE — Telephone Encounter (Signed)
Appears Dr. Gerda Diss (PCP) already placed referral to [facility] 10/08/22?

## 2022-10-19 NOTE — Telephone Encounter (Signed)
Patient called in and is wanting a referral now sent to [facility] for pain management. Said she never went to the [facility]. She is seeing Stefani Dama, PA there on Wednesday fax # [phone].

## 2022-10-19 NOTE — Telephone Encounter (Signed)
Noted  

## 2022-10-20 NOTE — Telephone Encounter (Signed)
Received signed ROI, faxed notes to [facility] ATTN: Stefani Dama, PA

## 2022-10-21 ENCOUNTER — Encounter (INDEPENDENT_AMBULATORY_CARE_PROVIDER_SITE_OTHER): Payer: Self-pay | Admitting: Gastroenterology

## 2022-10-21 DIAGNOSIS — E7800 Pure hypercholesterolemia, unspecified: Secondary | ICD-10-CM | POA: Diagnosis not present

## 2022-10-21 DIAGNOSIS — Z9181 History of falling: Secondary | ICD-10-CM | POA: Diagnosis not present

## 2022-10-21 DIAGNOSIS — G35 Multiple sclerosis: Secondary | ICD-10-CM | POA: Diagnosis not present

## 2022-10-21 DIAGNOSIS — Z79899 Other long term (current) drug therapy: Secondary | ICD-10-CM | POA: Diagnosis not present

## 2022-10-21 DIAGNOSIS — R030 Elevated blood-pressure reading, without diagnosis of hypertension: Secondary | ICD-10-CM | POA: Diagnosis not present

## 2022-10-23 DIAGNOSIS — Z79899 Other long term (current) drug therapy: Secondary | ICD-10-CM | POA: Diagnosis not present

## 2022-10-29 ENCOUNTER — Ambulatory Visit: Payer: Medicare Other | Admitting: Neurology

## 2022-10-29 ENCOUNTER — Encounter: Payer: Self-pay | Admitting: Neurology

## 2022-10-29 VITALS — BP 132/84 | HR 99 | Ht 69.0 in | Wt 213.5 lb

## 2022-10-29 DIAGNOSIS — R269 Unspecified abnormalities of gait and mobility: Secondary | ICD-10-CM

## 2022-10-29 DIAGNOSIS — R208 Other disturbances of skin sensation: Secondary | ICD-10-CM | POA: Diagnosis not present

## 2022-10-29 DIAGNOSIS — G35 Multiple sclerosis: Secondary | ICD-10-CM

## 2022-10-29 DIAGNOSIS — R252 Cramp and spasm: Secondary | ICD-10-CM

## 2022-10-29 MED ORDER — AMANTADINE HCL 100 MG PO CAPS: 100.0000 mg | ORAL_CAPSULE | Freq: Two times a day (BID) | ORAL | 11 refills | Status: DC

## 2022-10-29 NOTE — Progress Notes (Signed)
[facility]  PATIENT: Natalie Campos DOB: [birth year]  REFERRING DOCTOR OR PCP [name] MD SOURCE: Patient, notes from Dr. Gerilyn Pilgrim, imaging and lab reports, MRI images personally reviewed.  _________________________________   HISTORICAL  CHIEF COMPLAINT:  Chief Complaint  Patient presents with   Follow-up    Pt in room 10. Husband in room. Here for MS follow up pt states she has having multiple sclerosis exacerbationstarted on 10/14/22 pain/neck and weakness, headaches.    HISTORY OF PRESENT ILLNESS:  Natalie Campos is a 62 y.o. woman with a diagnosis of multiple sclerosis.  UPDATE 10/29/2022: She was diagnosed with MS around 62.  Recent MRIs show overall low plaque burden  She thinks she is having an exacerbation.  She is noting pain in her neck and back.  She noted the hand and feet drawing up.  She reports her balance is worse.  And she feels weaker.      She uses a walker mostly now but was using a cane last visit.      Currently, she has difficulty with gait and uses a cane and uses a walker if more tired.    Her left leg is weaker than the right.   She notes dystonia in the feet, left worse than right.     She notes the right hand has a reduced grip.    Handwriting is poor.   For spasticity she takes baclofen 10 mg po bis and valium 25 - 30 mg daily in split dose.   She is also on gabeontin 600 mg po tid for neuropathic pain.   Lyrica had helped better but she gained 30 pounds  She has urinary urgency and 2-3 x nocturia and some nocturnal incontinence. Hesitancy is better with tamsulosin.   She has nocturia x 3 at night.     Tamsulosin has helped some   Myrbetriq worked better but was too expensive.   She has not been on tamsulosin  She has severe fatigue most afternoons.  She takes modafinil at 9 am and 11 am.    She has only mild benefit.    She sleeps well most days.  Adderall has helped her fatigue and sleepiness some but was poorly tolerated due to  elevated blood pressure.  Therefore, she rarely takes.    She and her husband have noted some cognitive issues.     She has left knee DJD but due to poor gait, orthopedics is not planning surgery.      MS History: She was diagnosed with MS in her 20's after presenting with left ON.   She had another exacerbation in her early 30's affecting strength and gait.   She was placed on Avonex in 1994/1995 but had flu-like reactions and felt weaker.   She was placed on Copaxone a coupe years later and stopped around 2005 due to breakthrough activity on MRI and lipoatrophy.   She was on no DMT for 8-10 years and then placed on Ocrevus in 2018.    She is on Ocrevus since 2018.  She has had a rash after each infusion that would be off/on in between infusions.   She also was diagnosed with psoriasis afterwards.   Although she did very well initially, she has had a few times when she has needed to be in wheelchair.  She feels over the last 10 to 20 years that there have not been exacerbations though there have been times she has done worse.  She notes  that her slope of progression on Ocrevus is probably about the same as it had been before the Ocrevus.  Serial MRIs between 2006 and 2023 have not shown new MS lesions in the brain.  She has an overall low plaque burden in the brain.  There are no lesions in the brainstem.  She does not have foci in the cervical spine.  She has not had a thoracic spine scan.   IMAGING: MRI of the brain 07/23/2021, 07/20/2012 and 08/13/2009 showed identical pattern of T2/FLAIR hyperintense foci in the periventricular, juxtacortical   white matter.  The overall plaque burden is low.  There were no foci in the infratentorial white matter.  MRI of the cervical spine 07/23/2021 and 08/13/2009 showed normal spinal cord.  She does have some degenerative changes.  No spinal stenosis.  She has degenerative changes at C5-C6 and C6-C7 with potential for left C6 and right C7 nerve root compression on  the more recent images.Marland Kitchen   REVIEW OF SYSTEMS: Constitutional: No fevers, chills, sweats, or change in appetite Eyes: No visual changes, double vision, eye pain Ear, nose and throat: No hearing loss, ear pain, nasal congestion, sore throat Cardiovascular: No chest pain, palpitations Respiratory:  No shortness of breath at rest or with exertion.   No wheezes GastrointestinaI: No nausea, vomiting, diarrhea, abdominal pain, fecal incontinence.  Has GERD Genitourinary:  No dysuria, urinary retention or frequency.  No nocturia. Musculoskeletal:  No neck pain, back pain Integumentary: No rash, pruritus, skin lesions Neurological: as above Psychiatric: No depression at this time.  No anxiety Endocrine: No palpitations, diaphoresis, change in appetite, change in weigh or increased thirst Hematologic/Lymphatic:  No anemia, purpura, petechiae. Allergic/Immunologic: No itchy/runny eyes, nasal congestion, recent allergic reactions, rashes  ALLERGIES: Allergies  Allergen Reactions   Ace Inhibitors Anaphylaxis   Atenolol Anaphylaxis   Losartan Swelling    Severe tongue swelling   Milk-Related Compounds     rash   Penicillins     Childhood Allergy - caused coma  Has patient had a PCN reaction causing immediate rash, facial/tongue/throat swelling, SOB or lightheadedness with hypotension: No Has patient had a PCN reaction causing severe rash involving mucus membranes or skin necrosis: No Has patient had a PCN reaction that required hospitalization: No Has patient had a PCN reaction occurring within the last 10 years: No If all of the above answers are "NO", then may proceed with Cephalosporin use.     HOME MEDICATIONS:  Current Outpatient Medications:    amantadine (SYMMETREL) 100 MG capsule, Take 1 capsule (100 mg total) by mouth 2 (two) times daily., Disp: 60 capsule, Rfl: 11   amLODipine (NORVASC) 5 MG tablet, TAKE 1 TABLET BY MOUTH DAILY, Disp: 90 tablet, Rfl: 1   azelastine (ASTELIN)  0.1 % nasal spray, Apply 1-2 sprays each nostril twice daily as needed. Aim upward and outward, Disp: 30 mL, Rfl: 5   baclofen (LIORESAL) 10 MG tablet, Take 10 mg by mouth 2 (two) times daily. , Disp: , Rfl:    diazepam (VALIUM) 10 MG tablet, 1 in the morning,1/2 or 1 midday, 1 in the early evening for MS muscle spasms, Disp: 90 tablet, Rfl: 2   docusate sodium (COLACE) 100 MG capsule, Take 100 mg by mouth 2 (two) times daily., Disp: , Rfl:    DULoxetine (CYMBALTA) 60 MG capsule, Take 60 mg by mouth daily. , Disp: , Rfl:    esomeprazole (NEXIUM) 40 MG capsule, TAKE 1 CAPSULE BY MOUTH TWICE  DAILY BEFORE A  MEAL, Disp: 180 capsule, Rfl: 3   famotidine (PEPCID) 20 MG tablet, Take 1 tablet (20 mg total) by mouth 2 (two) times daily as needed (hives)., Disp: 60 tablet, Rfl: 5   fexofenadine (ALLEGRA ALLERGY) 180 MG tablet, Take 1 tablet (180 mg total) by mouth 2 (two) times daily as needed for allergies (hives)., Disp: 60 tablet, Rfl: 5   fish oil-omega-3 fatty acids 1000 MG capsule, Take 1 g by mouth daily., Disp: , Rfl:    fluticasone (FLONASE) 50 MCG/ACT nasal spray, Place 2 sprays into both nostrils daily., Disp: 16 g, Rfl: 5   gabapentin (NEURONTIN) 600 MG tablet, Take 600 mg by mouth 3 (three) times daily. , Disp: , Rfl:    hydrochlorothiazide (MICROZIDE) 12.5 MG capsule, TAKE 1 CAPSULE BY MOUTH DAILY, Disp: 90 capsule, Rfl: 0   HYDROmorphone (DILAUDID) 2 MG tablet, 1 tablet taken 3 times daily as directed, Disp: 90 tablet, Rfl: 0   modafinil (PROVIGIL) 200 MG tablet, 1 in the morning 1 at noon, Disp: 60 tablet, Rfl: 3   multivitamin (THERAGRAN) per tablet, Take 1 tablet by mouth daily., Disp: , Rfl:    naloxone (NARCAN) nasal spray 4 mg/0.1 mL, Use as directed for opioid overdose, Disp: 1 each, Rfl: 0   olopatadine (PATANOL) 0.1 % ophthalmic solution, Place 1 drop into both eyes 2 (two) times daily., Disp: 5 mL, Rfl: 12   polyethylene glycol powder (GLYCOLAX/MIRALAX) 17 GM/SCOOP powder, MIX 25.5  GRAMS WITH WATER OR JUICE ONCE DAILY., Disp: 510 g, Rfl: 5   potassium chloride (KLOR-CON) 10 MEQ tablet, TAKE 1 TABLET BY MOUTH TWICE  DAILY, Disp: 200 tablet, Rfl: 2   sucralfate (CARAFATE) 1 g tablet, TAKE 1 TABLET BY MOUTH 4 TIMES  DAILY, Disp: 360 tablet, Rfl: 3   tamsulosin (FLOMAX) 0.4 MG CAPS capsule, Take 1 capsule (0.4 mg total) by mouth daily., Disp: 30 capsule, Rfl: 11   traMADol (ULTRAM) 50 MG tablet, 2 taken 3 times daily as directed, Disp: 180 tablet, Rfl: 3   trimethoprim-polymyxin b (POLYTRIM) ophthalmic solution, Apply 1-2 drops into affected eye QID x 5 days., Disp: 10 mL, Rfl: 0   Vitamin D, Ergocalciferol, (DRISDOL) 1.25 MG (50000 UNIT) CAPS capsule, Take 50,000 Units by mouth every 7 (seven) days., Disp: , Rfl:    HYDROmorphone (DILAUDID) 2 MG tablet, One taken 3 times a day as needed (Patient not taking: Reported on 10/29/2022), Disp: 90 tablet, Rfl: 0  PAST MEDICAL HISTORY: Past Medical History:  Diagnosis Date   Anemia    Angio-edema    Anxiety    Barrett's esophagus    Bulging discs    Cataract    Complication of anesthesia    Complication of anesthesia    patietn wakes up easily- has anxiety due to waking up during a simple procedure   Depression    DJD (degenerative joint disease)    Dog bite(E906.0) 12/06/2009   Eczema    GERD (gastroesophageal reflux disease)    High triglycerides    Hyperlipidemia    Hypertension    IBS (irritable bowel syndrome)    contipation predominant    Iron deficiency anemia    Multiple sclerosis (HCC) 05/11/1988   Dx in 1990 most recent hospitalization for a flareis in 2011    Obesity     PAST SURGICAL HISTORY: Past Surgical History:  Procedure Laterality Date   bilateral cataract surgery      BIOPSY  01/28/2011   Procedure: BIOPSY;  Surgeon: Malissa Hippo,  MD;  [facility];  Service: Endoscopy;  Laterality: N/A;   BIOPSY N/A 08/25/2013   Procedure: BIOPSY;  Surgeon: Malissa Hippo, MD;  Location: [facility];  Service: Endoscopy;  Laterality: N/A;   BIOPSY  02/12/2021   Procedure: BIOPSY;  Surgeon: Malissa Hippo, MD;  [facility];  Service: Endoscopy;;   CHOLECYSTECTOMY  05/11/2008   Dr. Gabriel Cirri    COLONOSCOPY WITH PROPOFOL N/A 08/25/2013   Procedure: COLONOSCOPY WITH PROPOFOL;  Surgeon: Malissa Hippo, MD;  Location: [facility];  Service: Endoscopy;  Laterality: N/A;  in cecum at 0811 out at 0822 = 11 minutes   ESOPHAGOGASTRODUODENOSCOPY  01/28/2011   Procedure: ESOPHAGOGASTRODUODENOSCOPY (EGD);  Surgeon: Malissa Hippo, MD;  [facility];  Service: Endoscopy;  Laterality: N/A;  1:00   ESOPHAGOGASTRODUODENOSCOPY  03/24/2021   Procedure: ESOPHAGOGASTRODUODENOSCOPY (EGD);  Surgeon: Luretha Murphy, MD;  Location: [facility];  Service: General;;   ESOPHAGOGASTRODUODENOSCOPY (EGD) WITH PROPOFOL N/A 08/25/2013   Procedure: ESOPHAGOGASTRODUODENOSCOPY (EGD) WITH PROPOFOL;  Surgeon: Malissa Hippo, MD;  Location: [facility];  Service: Endoscopy;  Laterality: N/A;   ESOPHAGOGASTRODUODENOSCOPY (EGD) WITH PROPOFOL N/A 02/12/2021   Procedure: ESOPHAGOGASTRODUODENOSCOPY (EGD) WITH PROPOFOL;  Surgeon: Malissa Hippo, MD;  [facility];  Service: Endoscopy;  Laterality: N/A;  7:30   WISDOM TOOTH EXTRACTION     XI ROBOTIC ASSISTED HIATAL HERNIA REPAIR N/A 03/24/2021   Procedure: XI ROBOTIC ASSISTED HIATAL HERNIA REPAIR WITH FUNDOPLICATION;  Surgeon: Luretha Murphy, MD;  Location: [facility];  Service: General;  Laterality: N/A;    FAMILY HISTORY: Family History  Problem Relation Age of Onset   Eczema Father    Asthma Brother    Asthma Maternal Uncle    Asthma Paternal Uncle    Allergic rhinitis Daughter     SOCIAL HISTORY: Social History   Socioeconomic History   Marital status: Married    Spouse name: Not on file   Number of children: 3   Years of education: Not on file   Highest education level: GED or equivalent  Occupational History   Occupation: disabled since  2002  Tobacco Use   Smoking status: Former    Packs/day: 1.50    Years: 30.00    Additional pack years: 0.00    Total pack years: 45.00    Types: Cigarettes    Quit date: 03/08/2008    Years since quitting: 14.6    Passive exposure: Past   Smokeless tobacco: Never  Vaping Use   Vaping Use: Never used  Substance and Sexual Activity   Alcohol use: No    Alcohol/week: 0.0 standard drinks of alcohol   Drug use: No   Sexual activity: Yes    Birth control/protection: Post-menopausal  Other Topics Concern   Not on file  Social History Narrative   Two grown children, still birth infant    Social Determinants of Health   Financial Resource Strain: Low Risk  (07/31/2022)   Overall Financial Resource Strain (CARDIA)    Difficulty of Paying Living Expenses: Not very hard  Food Insecurity: No Food Insecurity (07/31/2022)   Hunger Vital Sign    Worried About Running Out of Food in the Last Year: Never true    Ran Out of Food in the Last Year: Never true  Transportation Needs: No Transportation Needs (07/31/2022)   PRAPARE - Administrator, Civil Service (Medical): No    Lack of Transportation (Non-Medical): No  Physical Activity: Unknown (07/31/2022)  Exercise Vital Sign    Days of Exercise per Week: 1 day    Minutes of Exercise per Session: Patient declined  Stress: Stress Concern Present (07/31/2022)   [employer] of Occupational Health - Occupational Stress Questionnaire    Feeling of Stress : To some extent  Social Connections: Moderately Integrated (07/31/2022)   Social Connection and Isolation Panel [NHANES]    Frequency of Communication with Friends and Family: Three times a week    Frequency of Social Gatherings with Friends and Family: Three times a week    Attends Religious Services: 1 to 4 times per year    Active Member of Clubs or Organizations: No    Attends Banker Meetings: Not on file    Marital Status: Married  Catering manager  Violence: Not on file       PHYSICAL EXAM  Vitals:   10/29/22 1412  BP: 132/84  Pulse: 99  Weight: 213 lb 8 oz (96.8 kg)  Height: 5\' 9"  (1.753 m)    Body mass index is 31.53 kg/m.   General: The patient is well-developed and well-nourished and in no acute distress  HEENT:  Head is [place]/AT.  Sclera are anicteric.    Skin: Extremities are without rash or  edema.  Musculoskeletal:  Back is nontender  Neurologic Exam  Mental status: The patient is alert and oriented x 3 at the time of the examination. The patient has apparent normal recent and remote memory, with an apparently normal attention span and concentration ability.   Speech is normal.  Cranial nerves: Extraocular movements are full. Pupils are equal, round, and reactive to light and accomodation.  She has mild right ptosis.  Color vision is symmetric.  Facial strength and sensation was normal.  No dysarthria. No obvious hearing deficits are noted.  Motor:  Muscle bulk is normal.   Tone is normal. Strength is 4/5 in the iliopsoas and 4+/5 in other proximal limb muscles and 5 / 5 in distal muscles  Sensory: Sensory testing is intact to pinprick, soft touch and vibration sensation in the arms.  She reported reduced vibration sensation in the left leg relative to the right..  Coordination: Cerebellar testing reveals good finger-nose-finger and heel-to-shin bilaterally.  Gait and station: She needs to use her arms to rise from a chair.  Once up the station was stable.  Her gait was wide.  She is unable to do a tandem gait.  She walked a little bit better on her way to the lab than she did in the room.  Romberg is negative.   Reflexes: Deep tendon reflexes are symmetric and normal in the arms and knees and 1 at the ankles..   Plantar responses are flexor.    DIAGNOSTIC DATA (LABS, IMAGING, TESTING) - I reviewed patient records, labs, notes, testing and imaging myself where available.  Lab Results  Component Value Date    WBC 8.8 03/25/2021   HGB 13.4 03/25/2021   HCT 38.8 03/25/2021   MCV 91.5 03/25/2021   PLT 232 03/25/2021      Component Value Date/Time   NA 143 11/13/2021 1514   K 4.1 11/13/2021 1514   CL 100 11/13/2021 1514   CO2 25 11/13/2021 1514   GLUCOSE 114 (H) 11/13/2021 1514   GLUCOSE 130 (H) 03/21/2021 0926   BUN 9 11/13/2021 1514   CREATININE 1.06 (H) 11/13/2021 1514   CREATININE 0.78 09/18/2013 0949   CALCIUM 10.2 11/13/2021 1514   PROT 7.6 03/04/2020 1446  ALBUMIN 4.8 03/04/2020 1446   AST 17 03/04/2020 1446   ALT 14 03/04/2020 1446   ALKPHOS 114 03/04/2020 1446   BILITOT <0.2 03/04/2020 1446   GFRNONAA >60 03/24/2021 1252   GFRAA 77 03/04/2020 1446   Lab Results  Component Value Date   CHOL 241 (H) 11/13/2021   HDL 92 11/13/2021   LDLCALC 131 (H) 11/13/2021   TRIG 108 11/13/2021   CHOLHDL 2.6 11/13/2021   Lab Results  Component Value Date   HGBA1C 5.2 07/06/2014   Lab Results  Component Value Date   VITAMINB12 910 06/11/2014   Lab Results  Component Value Date   TSH 1.530 11/13/2021       ASSESSMENT AND PLAN  MULTIPLE SCLEROSIS - Plan: MR BRAIN W WO CONTRAST, MR CERVICAL SPINE W WO CONTRAST, MR THORACIC SPINE W WO CONTRAST  Gait disturbance - Plan: MR BRAIN W WO CONTRAST, MR CERVICAL SPINE W WO CONTRAST, MR THORACIC SPINE W WO CONTRAST  Spasms of the hands or feet - Plan: MR BRAIN W WO CONTRAST, MR CERVICAL SPINE W WO CONTRAST, MR THORACIC SPINE W WO CONTRAST  Dysesthesia - Plan: MR BRAIN W WO CONTRAST, MR CERVICAL SPINE W WO CONTRAST, MR THORACIC SPINE W WO CONTRAST   She will remain off of a disease modifying therapy for now.  Despite her many symptoms, radiographically she has extremely mild multiple sclerosis with just a few lesions of the brain and none in the infratentorial white matter or the spinal cord.  However, because of some new symptoms we need to reimage the spine.  Since she has been off of a disease modifying therapy, I will also  reimage the brain to determine if there is breakthrough activity.  If breakthrough activity is occurring we would need to get her on a disease modifying therapy.  Although we could go back to [place], at her age, Olin Pia or Ashok Cordia would be other choices. She continues to report a lot of physical and cognitive fatigue.  Hopefully, the reduced Valium dose will help some.  I will have her do a trial of amantadine which is sometimes helpful for MS related fatigue.  She is already on Provigil.  She had been on a stimulant in the past but it caused side effects so it was stopped.  But asked her to stop after a month if there is no benefit I do not have a good explanation for the spasms she gets in her legs as there are no spinal cord plaques.  But since this seems to have happened more recently we will check an MRI of the thoracic spine to make sure that there is not a myelopathy or MS plaque causing the symptoms.   Stay active and exercise as tolerated.   42-minute office visit with the majority of the time spent face-to-face for history and physical, discussion/counseling and decision-making.  Additional time with extensive imaging review, record review and documentation.  This visit is part of a comprehensive longitudinal care medical relationship regarding the patients primary diagnosis of MS and related concerns.   Jahmarion Popoff A. Epimenio Foot, MD, [facility] 10/30/2022, 8:22 AM Certified in Neurology, Clinical Neurophysiology, Sleep Medicine and Neuroimaging  [facility] [address] [place], [place] [address] [phone]

## 2022-10-30 ENCOUNTER — Other Ambulatory Visit: Payer: Self-pay | Admitting: Family Medicine

## 2022-11-02 ENCOUNTER — Encounter: Payer: Self-pay | Admitting: Neurology

## 2022-11-02 ENCOUNTER — Telehealth: Payer: Self-pay | Admitting: Neurology

## 2022-11-02 DIAGNOSIS — E7800 Pure hypercholesterolemia, unspecified: Secondary | ICD-10-CM | POA: Diagnosis not present

## 2022-11-02 DIAGNOSIS — Z79899 Other long term (current) drug therapy: Secondary | ICD-10-CM | POA: Diagnosis not present

## 2022-11-02 DIAGNOSIS — G35 Multiple sclerosis: Secondary | ICD-10-CM | POA: Diagnosis not present

## 2022-11-02 DIAGNOSIS — Z9181 History of falling: Secondary | ICD-10-CM | POA: Diagnosis not present

## 2022-11-02 DIAGNOSIS — R030 Elevated blood-pressure reading, without diagnosis of hypertension: Secondary | ICD-10-CM | POA: Diagnosis not present

## 2022-11-02 NOTE — Telephone Encounter (Signed)
[employer] medicare NPR sent to GI [phone] 

## 2022-11-03 DIAGNOSIS — Z13820 Encounter for screening for osteoporosis: Secondary | ICD-10-CM | POA: Diagnosis not present

## 2022-11-03 DIAGNOSIS — Z780 Asymptomatic menopausal state: Secondary | ICD-10-CM | POA: Diagnosis not present

## 2022-11-05 NOTE — Telephone Encounter (Signed)
Sent the MRI orders to [facility] to be scheduled. [phone]

## 2022-11-05 NOTE — Telephone Encounter (Signed)
Pt requesting MRI to be sent to [facility]. Would like call back.

## 2022-11-06 DIAGNOSIS — Z79899 Other long term (current) drug therapy: Secondary | ICD-10-CM | POA: Diagnosis not present

## 2022-11-11 ENCOUNTER — Other Ambulatory Visit: Payer: Self-pay | Admitting: Family Medicine

## 2022-11-16 ENCOUNTER — Other Ambulatory Visit: Payer: Self-pay

## 2022-11-18 ENCOUNTER — Other Ambulatory Visit: Payer: Self-pay

## 2022-11-18 ENCOUNTER — Encounter: Payer: Self-pay | Admitting: *Deleted

## 2022-11-18 MED ORDER — GABAPENTIN 600 MG PO TABS: 600.0000 mg | ORAL_TABLET | Freq: Three times a day (TID) | ORAL | 5 refills | Status: DC

## 2022-11-18 NOTE — Telephone Encounter (Signed)
Dr.Sater are you willing to prescribe? Her retired Insurance account manager prescribed Rx. Pt last seen on 10/29/22

## 2022-11-25 ENCOUNTER — Other Ambulatory Visit: Payer: Self-pay

## 2022-11-30 ENCOUNTER — Ambulatory Visit: Payer: Medicare Other | Admitting: Family Medicine

## 2022-12-02 ENCOUNTER — Other Ambulatory Visit (HOSPITAL_COMMUNITY): Payer: Medicare Other

## 2022-12-02 ENCOUNTER — Ambulatory Visit (HOSPITAL_COMMUNITY): Payer: Medicare Other

## 2022-12-03 ENCOUNTER — Ambulatory Visit (HOSPITAL_COMMUNITY)
Admission: RE | Admit: 2022-12-03 | Discharge: 2022-12-03 | Disposition: A | Discharge status: Home / Self Care | Payer: Medicare Other | Source: Ambulatory Visit | Attending: Neurology | Admitting: Neurology

## 2022-12-03 DIAGNOSIS — R252 Cramp and spasm: Secondary | ICD-10-CM

## 2022-12-03 DIAGNOSIS — R269 Unspecified abnormalities of gait and mobility: Secondary | ICD-10-CM

## 2022-12-03 DIAGNOSIS — G35 Multiple sclerosis: Secondary | ICD-10-CM | POA: Insufficient documentation

## 2022-12-03 DIAGNOSIS — R208 Other disturbances of skin sensation: Secondary | ICD-10-CM | POA: Diagnosis present

## 2022-12-03 MED ORDER — GADOBUTROL 1 MMOL/ML IV SOLN
10.0000 mL | Freq: Once | INTRAVENOUS | Status: AC | PRN
  Administered 2022-12-03: 10 mL via INTRAVENOUS

## 2022-12-07 ENCOUNTER — Ambulatory Visit (INDEPENDENT_AMBULATORY_CARE_PROVIDER_SITE_OTHER): Payer: Medicare Other | Admitting: Gastroenterology

## 2022-12-08 ENCOUNTER — Ambulatory Visit (INDEPENDENT_AMBULATORY_CARE_PROVIDER_SITE_OTHER): Payer: Medicare Other | Admitting: Gastroenterology

## 2022-12-09 ENCOUNTER — Ambulatory Visit (INDEPENDENT_AMBULATORY_CARE_PROVIDER_SITE_OTHER): Payer: Medicare Other | Admitting: Family Medicine

## 2022-12-09 ENCOUNTER — Encounter: Payer: Self-pay | Admitting: Neurology

## 2022-12-09 VITALS — BP 138/90 | HR 105 | Temp 97.3°F | Ht 69.0 in | Wt 219.0 lb

## 2022-12-09 DIAGNOSIS — M62830 Muscle spasm of back: Secondary | ICD-10-CM | POA: Diagnosis not present

## 2022-12-09 DIAGNOSIS — R1312 Dysphagia, oropharyngeal phase: Secondary | ICD-10-CM

## 2022-12-09 DIAGNOSIS — R7989 Other specified abnormal findings of blood chemistry: Secondary | ICD-10-CM

## 2022-12-09 DIAGNOSIS — M79672 Pain in left foot: Secondary | ICD-10-CM

## 2022-12-09 DIAGNOSIS — E7849 Other hyperlipidemia: Secondary | ICD-10-CM

## 2022-12-09 DIAGNOSIS — R7301 Impaired fasting glucose: Secondary | ICD-10-CM

## 2022-12-09 DIAGNOSIS — G35 Multiple sclerosis: Secondary | ICD-10-CM | POA: Diagnosis not present

## 2022-12-09 DIAGNOSIS — K582 Mixed irritable bowel syndrome: Secondary | ICD-10-CM

## 2022-12-09 MED ORDER — DIAZEPAM 10 MG PO TABS: ORAL_TABLET | ORAL | 2 refills | Status: DC

## 2022-12-09 MED ORDER — AMLODIPINE BESYLATE 5 MG PO TABS: 5.0000 mg | ORAL_TABLET | Freq: Every day | ORAL | 1 refills | Status: DC

## 2022-12-09 NOTE — Progress Notes (Signed)
   Subjective:    Patient ID: Natalie Campos, female    DOB: 07-Oct-1960, 62 y.o.   MRN: [id number]  HPI 3 month follow up for muscle spasms for MS- diazepam Reports left foot hammertoe and left knee pain going on for 3 weeks Patient states she has chronic pain every day in her arms and legs related to the MS she also states she has severe muscle spasms and contortions when she does not take her diazepam.  In addition to this she relates a lot of left foot pain from a hammertoe.  She denies feeling drowsy or loopy with the medicine.  She does have multiple underlying health issues as well. Multiple sclerosis (HCC)  Spasm of back muscles  Fasting hyperglycemia  Elevated serum creatinine  Other hyperlipidemia  Oropharyngeal dysphagia  Irritable bowel syndrome with both constipation and diarrhea  Left foot pain - Plan: Ambulatory referral to Podiatry   Review of Systems     Objective:   Physical Exam  General-in no acute distress Eyes-no discharge Lungs-respiratory rate normal, CTA CV-no murmurs,RRR Extremities skin warm dry no edema Neuro grossly normal Behavior normal, alert Hammertoe noted on the left foot      Assessment & Plan:  1. Multiple sclerosis (HCC) Followed by neurology   2. Spasm of back muscles She has a lot of back spasm muscle spasms related to her MS she has been on diazepam 10 mg every 6 hours for years we have reduced her down to the equivalent of 25 mg/day when she is able to we recommend just 5 mg 4 times daily-we will connect with other specialist to see if there is alternative ways of approaching this issue  She is on a significant amount of pain medication through [facility].  Ideally she would not be on diazepam but she states without it she has severe muscle spasms and contortions according to the patient and her husband she has been on this medicine for 20 years  I did discuss with them that diazepam with hydromorphone increases  her risk of accidental overdose and ideally I would like for her to utilize the diazepam as little as possible.  We will communicate with neurology to see if this is something that we will follow under their area I did give her enough refills to last for a couple months-if neurology defers from this medication we will need to touch base with [facility]   3. Fasting hyperglycemia Lab work she stated she got this through [facility] we have requested this  4. Elevated serum creatinine We have requested the lab work  5. Other hyperlipidemia We have requested the lab work healthy diet  6. Oropharyngeal dysphagia Patient with intermittent choking spells she has a follow-up with gastroenterology coming up she will discuss with them  7. Irritable bowel syndrome with both constipation and diarrhea She uses MiraLAX as needed cannot afford Linzess.  If any ongoing troubles she will follow-up accordingly  8. Left foot pain Referral to podiatry because a hammertoe left side - Ambulatory referral to Podiatry  Follow-up within 5 to 6 months sooner if any problems

## 2022-12-11 ENCOUNTER — Telehealth: Payer: Self-pay | Admitting: Family Medicine

## 2022-12-11 NOTE — Telephone Encounter (Signed)
Nurses-patient was getting her pain medications through [facility] Please try to get most recent notes from them for the purpose of [place] verifying how she is supposed to take her chronic pain medicine thank you I believe she has been seen within the past 30 days that is what I am searching for thank you

## 2022-12-15 NOTE — Telephone Encounter (Signed)
[facility] medical [phone]) to get latest notes on patient for Dr Lorin Picket faxed over. (Nurse Note*patient was getting her pain medications through [facility] Please try to get most recent notes from them for the purpose of [place] verifying how she is supposed to take her chronic pain medicine thank you I believe she has been seen within the past 30 days that is what I am searching for thank you)

## 2022-12-17 ENCOUNTER — Ambulatory Visit: Payer: Medicare Other | Admitting: Podiatry

## 2022-12-18 ENCOUNTER — Telehealth: Payer: Self-pay | Admitting: Family Medicine

## 2022-12-18 NOTE — Telephone Encounter (Signed)
Nurses Patient gets her pain medications through [facility] on [address].  Please connect with [facility] asked them to send this the most recent 2 office visits so we can get a feel for what her game plan is for pain management.  You will need to explain to [facility] that we are her primary care doctors thank you

## 2022-12-18 NOTE — Telephone Encounter (Signed)
See 12/11/22 message:     Natalie Campos [facility]      12/15/22 11:17 AM Note [facility] [phone]) to get latest notes on patient for Dr Lorin Picket faxed over.    (Nurse Note*patient was getting her pain medications through [facility] Please try to get most recent notes from them for the purpose of [place] verifying how she is supposed to take her chronic pain medicine thank you I believe she has been seen within the past 30 days that is what I am searching for thank you)

## 2022-12-23 LAB — HM DEXA SCAN

## 2022-12-25 ENCOUNTER — Ambulatory Visit (INDEPENDENT_AMBULATORY_CARE_PROVIDER_SITE_OTHER): Payer: Medicare Other

## 2022-12-25 ENCOUNTER — Ambulatory Visit: Payer: Medicare Other | Admitting: Podiatry

## 2022-12-25 ENCOUNTER — Encounter: Payer: Self-pay | Admitting: Podiatry

## 2022-12-25 DIAGNOSIS — M7752 Other enthesopathy of left foot: Secondary | ICD-10-CM

## 2022-12-25 DIAGNOSIS — M2042 Other hammer toe(s) (acquired), left foot: Secondary | ICD-10-CM

## 2022-12-25 MED ORDER — TRIAMCINOLONE ACETONIDE 10 MG/ML IJ SUSP
10.0000 mg | Freq: Once | INTRAMUSCULAR | Status: AC
Start: 2022-12-25 — End: 2022-12-25
  Administered 2022-12-25: 10 mg via INTRA_ARTICULAR

## 2022-12-25 MED ORDER — METHYLPREDNISOLONE 4 MG PO TBPK: ORAL_TABLET | ORAL | 0 refills | Status: DC

## 2022-12-28 NOTE — Progress Notes (Signed)
Subjective:   Patient ID: Natalie Campos, female   DOB: 62 y.o.   MRN: [id number]   HPI Patient presents with significant digital deformities left over right foot rigid contracture digits 2 with a lot of discomfort in the joint associated with this.  Patient presents with caregiver does not smoke tries to be active   Review of Systems  All other systems reviewed and are negative.       Objective:  Physical Exam Vitals and nursing note reviewed.  Constitutional:      Appearance: She is well-developed.  Pulmonary:     Effort: Pulmonary effort is normal.  Musculoskeletal:        General: Normal range of motion.  Skin:    General: Skin is warm.  Neurological:     Mental Status: She is alert.     Neurovascular status found to be moderately diminished with the patient found to have some discoloration of the forefoot bilateral.  Patient is found to have inflammation around the lesser MPJs left over right with rigid contracture lesser digit left over right redness on top of the toe with moderate discomfort.  Good digital perfusion well-oriented     Assessment:  Inflammatory capsulitis that is more chronic in nature along with rigid digital deformity with overall poor physical health     Plan:  H&P reviewed all conditions.  I like to avoid surgery on this patient even though digital stabilization could be necessary someday.  At this point I did sterile prep I did periarticular injection around the second and third MPJ left with 3 mg dexamethasone Kenalog 5 mg Xylocaine and advised on relatively rigid bottom shoes.  Reappoint as symptoms indicate  X-rays indicate significant rigid contracture of the lesser digit left over right moderate osteoporosis arthritis

## 2023-01-03 ENCOUNTER — Other Ambulatory Visit: Payer: Self-pay | Admitting: Family Medicine

## 2023-01-04 ENCOUNTER — Ambulatory Visit (INDEPENDENT_AMBULATORY_CARE_PROVIDER_SITE_OTHER): Payer: Medicare Other | Admitting: Gastroenterology

## 2023-01-07 ENCOUNTER — Other Ambulatory Visit: Payer: Self-pay | Admitting: Family Medicine

## 2023-01-08 ENCOUNTER — Ambulatory Visit: Payer: Medicare Other | Admitting: Podiatry

## 2023-01-08 ENCOUNTER — Other Ambulatory Visit: Payer: Self-pay | Admitting: Family Medicine

## 2023-01-08 ENCOUNTER — Encounter: Payer: Self-pay | Admitting: Podiatry

## 2023-01-08 DIAGNOSIS — M722 Plantar fascial fibromatosis: Secondary | ICD-10-CM | POA: Diagnosis not present

## 2023-01-08 DIAGNOSIS — M7752 Other enthesopathy of left foot: Secondary | ICD-10-CM

## 2023-01-08 MED ORDER — TRIAMCINOLONE ACETONIDE 10 MG/ML IJ SUSP
10.0000 mg | Freq: Once | INTRAMUSCULAR | Status: AC
Start: 2023-01-08 — End: 2023-01-08
  Administered 2023-01-08: 10 mg via INTRA_ARTICULAR

## 2023-01-08 NOTE — Progress Notes (Signed)
Subjective:   Patient ID: Natalie Campos, female   DOB: 62 y.o.   MRN: [id number]   HPI Patient is still having a lot of pain in her ankle and foot and has a lot of other issues that are contributory to the discomfort she experiences.  Stated she had temporary relief but still is having a lot of pain more in the ankle now and in the bottom of the heel   ROS      Objective:  Physical Exam  Vascular status was found to be intact inflammation of the ankle sinus tarsi left and into the plantar fascia left with patient having inability to stretch her foot properly     Assessment:  Inflammatory capsulitis of the sinus tarsi left with plantar fasciitis left and just generalized pain     Plan:  H&P reviewed we discussed trying to stretch the foot and hold it and they want to do this and are motivated I dispensed a prefab night splint over-the-counter that provides for 90 degrees of dorsiflexion and I advised on heat ice therapy.  I did do sterile prep injected the sinus tarsi left 3 mg Kenalog 5 mg Xylocaine

## 2023-01-20 ENCOUNTER — Encounter: Payer: Self-pay | Admitting: Family Medicine

## 2023-01-20 DIAGNOSIS — F331 Major depressive disorder, recurrent, moderate: Secondary | ICD-10-CM | POA: Insufficient documentation

## 2023-01-20 DIAGNOSIS — M816 Localized osteoporosis [Lequesne]: Secondary | ICD-10-CM | POA: Insufficient documentation

## 2023-01-20 NOTE — Telephone Encounter (Signed)
Nurses Please let Natalie Campos know that I reviewed over her message-more than likely we would have to discuss possibly changing the medicine or adding an additional medicine.  In my opinion it would be beneficial for her to do a follow-up to discuss this in further detail  If she is able to do a virtual visit we can do 1 of those as well.  Late morning or open slot would be fine Thanks-Dr. Lorin Picket

## 2023-01-24 ENCOUNTER — Telehealth: Payer: Self-pay | Admitting: Family Medicine

## 2023-01-24 NOTE — Telephone Encounter (Signed)
Nurses  A while back I essentially inherited Natalie Campos's situation  She was being seen by Dr.[place].  He had her on pain medication and Valium.  At that time I explained to the patient that this is not considered a safe combination.  I also told her that at the time I would prescribe.  But I would also work with her neurologist  Since then-given this is a potentially dangerous combination the healthcare system does not want [place] prescribing benzodiazepines to people who are on significant amount of opioids. This is something that I will have to stop prescribing.  This type of medicine would need to be gradually tapered down.  Also I did communicate with Dr.Sater her neurologist who stated that this would not be something he would prescribe.  He stated that he would work with her if she needed other medicines for any type of spasticity but he does not prescribe benzodiazepines for individuals who are on opioids.  I would recommend a office visit or virtual visit somewhere within the next 2 to 3 weeks.  Also let the patient know that I will be sending her a MyChart message that we will lay out some of the parameters of tapering this medicine.  As you can see it is a complicated issue that would be best handled by a visit but it is necessary to alert the patient of the above, the need for an office visit, and the fact that I will be communicating some additional measures via MyChart  Please send correspondence back to me after talking with patient thank you feel free to include any statement concerns or questions she might have

## 2023-02-02 DIAGNOSIS — R32 Unspecified urinary incontinence: Secondary | ICD-10-CM | POA: Insufficient documentation

## 2023-02-03 ENCOUNTER — Encounter: Payer: Self-pay | Admitting: Family Medicine

## 2023-02-03 ENCOUNTER — Ambulatory Visit (INDEPENDENT_AMBULATORY_CARE_PROVIDER_SITE_OTHER): Payer: Medicare Other | Admitting: Nurse Practitioner

## 2023-02-03 ENCOUNTER — Encounter: Payer: Self-pay | Admitting: Nurse Practitioner

## 2023-02-03 VITALS — BP 128/81 | HR 90 | Temp 98.2°F | Ht 69.0 in | Wt 229.9 lb

## 2023-02-03 DIAGNOSIS — F341 Dysthymic disorder: Secondary | ICD-10-CM | POA: Diagnosis not present

## 2023-02-03 DIAGNOSIS — Z23 Encounter for immunization: Secondary | ICD-10-CM

## 2023-02-03 MED ORDER — ARIPIPRAZOLE 5 MG PO TABS: ORAL_TABLET | ORAL | 0 refills | Status: DC

## 2023-02-03 NOTE — Telephone Encounter (Signed)
Front office On Monday-September 30-please reach out to set her up an office visit  in October to discuss her diazepam-you can let her know that I am recommending a follow-up office visit to sit down and discuss her medications in person that we have also sent her a MyChart letter and this is being mailed to her thank you-Dr. Lorin Picket

## 2023-02-03 NOTE — Progress Notes (Signed)
Please mail to the patient
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
## 2023-02-05 ENCOUNTER — Ambulatory Visit: Payer: Medicare Other | Admitting: Podiatry

## 2023-02-08 ENCOUNTER — Ambulatory Visit: Payer: Medicare Other | Admitting: Podiatry

## 2023-02-09 ENCOUNTER — Ambulatory Visit: Payer: Medicare Other | Admitting: Family Medicine

## 2023-02-09 ENCOUNTER — Telehealth: Payer: Self-pay | Admitting: Physical Medicine and Rehabilitation

## 2023-02-09 DIAGNOSIS — M810 Age-related osteoporosis without current pathological fracture: Secondary | ICD-10-CM

## 2023-02-09 HISTORY — DX: Age-related osteoporosis without current pathological fracture: M81.0

## 2023-02-09 NOTE — Telephone Encounter (Signed)
Pt is requesting an appt for an injection

## 2023-02-10 NOTE — Telephone Encounter (Signed)
Spoke with patient and scheduled OV for 02/12/23. Last injection 02/2021

## 2023-02-12 ENCOUNTER — Encounter: Payer: Self-pay | Admitting: Physical Medicine and Rehabilitation

## 2023-02-12 ENCOUNTER — Ambulatory Visit (INDEPENDENT_AMBULATORY_CARE_PROVIDER_SITE_OTHER): Payer: Medicare Other | Admitting: Physical Medicine and Rehabilitation

## 2023-02-12 DIAGNOSIS — M5441 Lumbago with sciatica, right side: Secondary | ICD-10-CM | POA: Diagnosis not present

## 2023-02-12 DIAGNOSIS — M48061 Spinal stenosis, lumbar region without neurogenic claudication: Secondary | ICD-10-CM | POA: Diagnosis not present

## 2023-02-12 DIAGNOSIS — G8929 Other chronic pain: Secondary | ICD-10-CM

## 2023-02-12 DIAGNOSIS — M810 Age-related osteoporosis without current pathological fracture: Secondary | ICD-10-CM

## 2023-02-12 DIAGNOSIS — G35 Multiple sclerosis: Secondary | ICD-10-CM | POA: Diagnosis not present

## 2023-02-12 DIAGNOSIS — M5416 Radiculopathy, lumbar region: Secondary | ICD-10-CM

## 2023-02-12 NOTE — Progress Notes (Unsigned)
Functional Pain Scale - descriptive words and definitions  Unmanageable (7)  Pain interferes with normal ADL's/nothing seems to help/sleep is very difficult/active distractions are very difficult to concentrate on. Severe range order  Average Pain 9  Lower back pain across the lower back with radiation in the hips

## 2023-02-12 NOTE — Progress Notes (Unsigned)
Natalie Campos - 62 y.o. female MRN [id number]  Date of birth: [birth year]  Office Visit Note: Visit Date: 02/12/2023 PCP: Babs Sciara, MD Referred by: Babs Sciara, MD  Subjective: Chief Complaint  Patient presents with   Lower Back - Pain   HPI: Natalie Campos is a 62 y.o. female who comes in today for evaluation of chronic, worsening and severe bilateral lower back pain radiating to buttock, hip and down right posterolateral leg. Pain ongoing for several years, worsens with walking and activity. She describes pain as sharp and stabbing sensation, currently rates as 8 out of 10. Some relief of pain with home exercise regimen, rest and use of medications. Currently undergoing home based physical therapy for more balance/gait issues. Currently being treated by Stefani Dama, PA with [facility] for chronic pain syndrome, she is prescribed Hydromorphone. Lumbar MRI imaging from 2022 exhibits moderate right foraminal stenosis and asymmetric right lateral recess stenosis. No high grade spinal canal stenosis noted. She was previously treated by Dr. Shirlean Kelly. No history of lumbar surgery. Patient underwent right S1 transforaminal epidural steroid injection in our office on 02/19/2021, she reports significant and sustained pain relief with this procedure. Patient currently ambulates with cane. She denies recent trauma or falls.   Her course is complicated by multiple sclerosis/chronic pain syndrome. She is currently managed by Dr. Despina Arias with [facility]. She was recently diagnosed with osteoporosis, she is scheduled to follow up with Sherron Ales, PA with [facility] in November.    Review of Systems  Musculoskeletal:  Positive for back pain, joint pain and myalgias.  Neurological:  Negative for tingling, sensory change, focal weakness and weakness.  All other systems reviewed and are negative.  Otherwise per HPI.  Assessment &  Plan: Visit Diagnoses:    ICD-10-CM   1. Chronic bilateral low back pain with right-sided sciatica  M54.41 Ambulatory referral to Physical Medicine Rehab   G89.29     2. Lumbar radiculopathy  M54.16 Ambulatory referral to Physical Medicine Rehab    3. Foraminal stenosis of lumbar region  M48.061 Ambulatory referral to Physical Medicine Rehab    4. Multiple sclerosis (HCC)  G35 Ambulatory referral to Physical Medicine Rehab    5. Age-related osteoporosis without current pathological fracture  M81.0 Ambulatory referral to Physical Medicine Rehab       Plan: Findings:  Chronic, worsening and severe bilateral lower back pain radiating to buttock, hip and down right posterolateral leg in the setting of multiple sclerosis and chronic pain syndrome. Patient continues to have severe pain despite good conservative therapies such as home based formal physical therapy, rest and use of medications. Patients clinical presentation and exam are consistent with S1 nerve pattern. I also feel her multiple sclerosis/chronic pain is contributing to her symptoms. Diffuse tenderness noted upon palpation of bilateral lumbar paraspinal regions. We discussed treatment plan in detail today, next step is to perform diagnostic and hopefully therapeutic right S1 transforaminal epidural steroid injection under fluoroscopic guidance. If good relief of pain with injection we can repeat this procedure infrequently as needed. If her pain persists would consider obtaining new lumbar MRI imaging. Patient instructed to continue chronic pain management with [facility]. She has no questions at this time. No red flag symptoms noted upon exam today.     Meds & Orders: No orders of the defined types were placed in this encounter.   Orders Placed This Encounter  Procedures   Ambulatory referral  to Physical Medicine Rehab    Follow-up: Return for Right S1 transforaminal epidural steroid injection.   Procedures: No  procedures performed      Clinical History: CLINICAL DATA:  Left leg pain.  Back problems for 3 years   EXAM: MRI LUMBAR SPINE WITHOUT CONTRAST   TECHNIQUE: Multiplanar, multisequence MR imaging of the lumbar spine was performed. No intravenous contrast was administered.   COMPARISON:  07/30/2011   FINDINGS: Segmentation:  5 lumbar type vertebrae   Alignment:  Mild scoliosis.   Vertebrae:  No fracture, evidence of discitis, or bone lesion.   Conus medullaris and cauda equina: Conus extends to the L1-2 level. Conus and cauda equina appear normal.   Paraspinal and other soft tissues: Partially covered right renal cyst. No perispinal mass or inflammation noted.   Disc levels:   T12- L1: Unremarkable.   L1-L2: Mild disc bulging.   L2-L3: Mild disc narrowing and bulging. Early facet spurring on the left   L3-L4: Mild disc narrowing and right eccentric bulging. Mild facet spurring.   L4-L5: Degenerative facet spurring.  Mild disc bulging.   L5-S1:Greatest level of degenerative disc narrowing which is right eccentric. Disc bulging and ridging towards the right where there is also greater facet spurring. Moderate right foraminal impingement. Asymmetric right subarticular recess narrowing that could affect the right S1 nerve root.   IMPRESSION: 1. Similar degree of lumbar spine degeneration with mild scoliosis when compared to 2018. 2. Neural impingement limited to the L5-S1 level at the right foramen and subarticular recess     Electronically Signed   By: Marnee Spring M.D.   On: 09/28/2020 16:09   She reports that she quit smoking about 14 years ago. Her smoking use included cigarettes. She started smoking about 44 years ago. She has a 45 pack-year smoking history. She has been exposed to tobacco smoke. She has never used smokeless tobacco. No results for input(s): "HGBA1C", "LABURIC" in the last 8760 hours.  Objective:  VS:  HT:    WT:   BMI:     BP:   HR:  bpm  TEMP: ( )  RESP:  Physical Exam Vitals and nursing note reviewed.  HENT:     Head: Normocephalic and atraumatic.     Right Ear: External ear normal.     Left Ear: External ear normal.     Nose: Nose normal.     Mouth/Throat:     Mouth: Mucous membranes are moist.  Eyes:     Extraocular Movements: Extraocular movements intact.  Cardiovascular:     Rate and Rhythm: Normal rate.     Pulses: Normal pulses.  Pulmonary:     Effort: Pulmonary effort is normal.  Abdominal:     General: Abdomen is flat. There is no distension.  Musculoskeletal:        General: Tenderness present.     Cervical back: Normal range of motion.     Comments: Patient is slow to rise from seated position to standing. Good lumbar range of motion. No pain noted with facet loading. 5/5 strength noted with bilateral hip flexion, knee flexion/extension, ankle dorsiflexion/plantarflexion and EHL. No clonus noted bilaterally. No pain upon palpation of greater trochanters. No pain with internal/external rotation of bilateral hips. Sensation intact bilaterally. Dysesthesias noted to right S1 dermatome. Tenderness noted to bilateral lumbar paraspinal regions. Negative slump test bilaterally. Ambulates with cane, gait slow and unsteady.     Skin:    General: Skin is warm and dry.  Capillary Refill: Capillary refill takes less than 2 seconds.  Neurological:     Mental Status: She is alert and oriented to person, place, and time.     Gait: Gait abnormal.  Psychiatric:        Mood and Affect: Mood normal.        Behavior: Behavior normal.     Ortho Exam  Imaging: No results found.  Past Medical/Family/Surgical/Social History: Medications & Allergies reviewed per EMR, new medications updated. Patient Active Problem List   Diagnosis Date Noted   Constipation 10/21/2021   Status post laparoscopic Nissen fundoplication 03/24/2021   Barrett's esophagus 01/17/2019   Hiatal hernia 08/12/2017   Diarrhea 09/11/2015    GERD (gastroesophageal reflux disease) 09/11/2015   Anemia 08/01/2013   Allergic reaction 10/16/2011   CAROTID BRUIT 02/27/2010   OBESITY, UNSPECIFIED 06/24/2009   ACUTE SINUSITIS, UNSPECIFIED 06/24/2009   Headache 06/24/2009   Hyperlipidemia 05/23/2009   Persistent depressive disorder 05/23/2009   MULTIPLE SCLEROSIS 05/23/2009   Essential hypertension 05/23/2009   EMPHYSEMA 05/23/2009   GERD 05/23/2009   IRRITABLE BOWEL SYNDROME 05/23/2009   ECZEMA 05/23/2009   DEGENERATIVE DISC DISEASE 05/23/2009   Past Medical History:  Diagnosis Date   Anemia    Angio-edema    Anxiety    Barrett's esophagus    Bulging discs    Cataract    Complication of anesthesia    Complication of anesthesia    patietn wakes up easily- has anxiety due to waking up during a simple procedure   Depression    DJD (degenerative joint disease)    Dog bite(E906.0) 12/06/2009   Eczema    GERD (gastroesophageal reflux disease)    High triglycerides    Hyperlipidemia    Hypertension    IBS (irritable bowel syndrome)    contipation predominant    Iron deficiency anemia    Multiple sclerosis (HCC) 05/11/1988   Dx in 1990 most recent hospitalization for a flareis in 2011    Obesity    Family History  Problem Relation Age of Onset   Eczema Father    Asthma Brother    Asthma Maternal Uncle    Asthma Paternal Uncle    Allergic rhinitis Daughter    Past Surgical History:  Procedure Laterality Date   bilateral cataract surgery      BIOPSY  01/28/2011   Procedure: BIOPSY;  Surgeon: Malissa Hippo, MD;  [facility]: [facility];  Service: Endoscopy;  Laterality: N/A;   BIOPSY N/A 08/25/2013   Procedure: BIOPSY;  Surgeon: Malissa Hippo, MD;  Location: [facility];  Service: Endoscopy;  Laterality: N/A;   BIOPSY  02/12/2021   Procedure: BIOPSY;  Surgeon: Malissa Hippo, MD;  [facility]: [facility];  Service: Endoscopy;;   CHOLECYSTECTOMY  05/11/2008   Dr. Gabriel Cirri    COLONOSCOPY WITH PROPOFOL N/A  08/25/2013   Procedure: COLONOSCOPY WITH PROPOFOL;  Surgeon: Malissa Hippo, MD;  Location: [facility];  Service: Endoscopy;  Laterality: N/A;  in cecum at 0811 out at 0822 = 11 minutes   ESOPHAGOGASTRODUODENOSCOPY  01/28/2011   Procedure: ESOPHAGOGASTRODUODENOSCOPY (EGD);  Surgeon: Malissa Hippo, MD;  [facility]: [facility];  Service: Endoscopy;  Laterality: N/A;  1:00   ESOPHAGOGASTRODUODENOSCOPY  03/24/2021   Procedure: ESOPHAGOGASTRODUODENOSCOPY (EGD);  Surgeon: Luretha Murphy, MD;  Location: [facility];  Service: General;;   ESOPHAGOGASTRODUODENOSCOPY (EGD) WITH PROPOFOL N/A 08/25/2013   Procedure: ESOPHAGOGASTRODUODENOSCOPY (EGD) WITH PROPOFOL;  Surgeon: Malissa Hippo, MD;  Location: [facility];  Service:  Endoscopy;  Laterality: N/A;   ESOPHAGOGASTRODUODENOSCOPY (EGD) WITH PROPOFOL N/A 02/12/2021   Procedure: ESOPHAGOGASTRODUODENOSCOPY (EGD) WITH PROPOFOL;  Surgeon: Malissa Hippo, MD;  Location: [facility];  Service: Endoscopy;  Laterality: N/A;  7:30   WISDOM TOOTH EXTRACTION     XI ROBOTIC ASSISTED HIATAL HERNIA REPAIR N/A 03/24/2021   Procedure: XI ROBOTIC ASSISTED HIATAL HERNIA REPAIR WITH FUNDOPLICATION;  Surgeon: Luretha Murphy, MD;  Location: [facility];  Service: General;  Laterality: N/A;   Social History   Occupational History   Occupation: disabled since 2002  Tobacco Use   Smoking status: Former    Current packs/day: 0.00    Average packs/day: 1.5 packs/day for 30.0 years (45.0 ttl pk-yrs)    Types: Cigarettes    Start date: 03/08/1978    Quit date: 03/08/2008    Years since quitting: 14.9    Passive exposure: Past   Smokeless tobacco: Never  Vaping Use   Vaping status: Never Used  Substance and Sexual Activity   Alcohol use: No    Alcohol/week: 0.0 standard drinks of alcohol   Drug use: No   Sexual activity: Yes    Birth control/protection: Post-menopausal

## 2023-02-18 ENCOUNTER — Encounter (INDEPENDENT_AMBULATORY_CARE_PROVIDER_SITE_OTHER): Payer: Self-pay | Admitting: Gastroenterology

## 2023-02-18 ENCOUNTER — Ambulatory Visit (INDEPENDENT_AMBULATORY_CARE_PROVIDER_SITE_OTHER): Payer: Medicare Other | Admitting: Gastroenterology

## 2023-02-18 VITALS — BP 136/81 | HR 123 | Temp 98.0°F | Ht 68.0 in | Wt 231.6 lb

## 2023-02-18 DIAGNOSIS — R1013 Epigastric pain: Secondary | ICD-10-CM | POA: Diagnosis not present

## 2023-02-18 DIAGNOSIS — K581 Irritable bowel syndrome with constipation: Secondary | ICD-10-CM

## 2023-02-18 DIAGNOSIS — K2270 Barrett's esophagus without dysplasia: Secondary | ICD-10-CM

## 2023-02-18 DIAGNOSIS — K224 Dyskinesia of esophagus: Secondary | ICD-10-CM | POA: Diagnosis not present

## 2023-02-18 DIAGNOSIS — K219 Gastro-esophageal reflux disease without esophagitis: Secondary | ICD-10-CM

## 2023-02-18 DIAGNOSIS — K449 Diaphragmatic hernia without obstruction or gangrene: Secondary | ICD-10-CM

## 2023-02-18 DIAGNOSIS — K5909 Other constipation: Secondary | ICD-10-CM | POA: Diagnosis not present

## 2023-02-18 MED ORDER — DEXLANSOPRAZOLE 60 MG PO CPDR: 60.0000 mg | DELAYED_RELEASE_CAPSULE | Freq: Every day | ORAL | 3 refills | Status: DC

## 2023-02-18 NOTE — Progress Notes (Signed)
Natalie Campos, M.D. Gastroenterology & Hepatology [facility] [address] [place], [place] [address]  Primary Care Physician: Babs Sciara, MD [address] [place] [address]  I will communicate my assessment and recommendations to the referring MD via EMR.  Problems: Hiatal hernia s/p repair and Nissen fundoplication GERD c/b SSBE IBS-C Severe esophageal dysmotility  History of Present Illness: Natalie Campos is a 62 y.o. female with past medical history of GERD complicated by short segment Barrett's esophagus, IBS-C, multiple sclerosis, IBS, who presents for follow up of GERD and dysphagia.  The patient was last seen on 10/21/2021 (Dr. Karilyn Cota). At that time, the patient was advised to start omeprazole 40 mg every day and to stop pantoprazole.  She was also advised to start Dulcolax for constipation as needed.  Patient has a longstanding history of GERD.  She has been seen by Dr. Karilyn Cota in the past for management of short segment Barrett's esophagus.  At some point she had abnormalities concerning for low-grade dysplasia but this was never seen again in repeat biopsies.  She also had a large hiatal hernia measuring up to 6 cm, for which she underwent hiatal hernia repair and Nissen fundoplication on 03/24/2021 with Dr. Luretha Murphy.  She had a repeat esophagram on 03/20/2022 that showed presence of recurrent small to moderate hiatal hernia with marked esophageal dysmotility possibly related to reflux related dysmotility.  Patient reports that she takes Miralax once a day and 3 stool softeners a day. States that she is having a bowel movement once a week.  When she eats lupini beans, she may have more bowel movements. Has recurrent bloating on a daily basis.  She reports that she has had recurrent issues with GERD. She wakes up during the night due to regurgitation and coughing every night due to regurgitation. She reports  that she is having these episodes of regurgitation almost every night. She states that she takes Peptobismol and Rollaids during the night. She states pasta sauce or spicy fodos may trigger heartburn and regurgitation. She is taking Nexium 40 mg twice a day. She has her head of the bed elevated.  Also reports having frequent choking episodes when drinking water or food. She also has  presented persistent dysphagia, mostly with pasta, rice and steak. She states that she has been presenting worsening dysphagia for the last several months and has had to vomit the food in the past.  The patient denies having any nausea, vomiting, fever, chills, hematochezia, melena, hematemesis, abdominal distention, abdominal pain, diarrhea, jaundice, pruritus. States her weight has been progressively increased.  Last EGD: 02/12/2021 - Normal hypopharynx. - Normal proximal esophagus, mid esophagus and distal esophagus. - Esophageal mucosal changes suspicious for short- segment Barrett' s esophagus. Biopsied. - Z- line, 34 cm from the incisors. - 6 cm hiatal hernia. - Scar in the gastric fundus. - Normal duodenal bulb and second portion of the duodenum.  Last Colonoscopy: 2015 Normal colonoscopy except external hemorrhoids and 2 smaller anal papillae.   Past Medical History: Past Medical History:  Diagnosis Date   Anemia    Angio-edema    Anxiety    Barrett's esophagus    Bulging discs    Cataract    Complication of anesthesia    Complication of anesthesia    patietn wakes up easily- has anxiety due to waking up during a simple procedure   Depression    DJD (degenerative joint disease)    Dog bite(E906.0)  12/06/2009   Eczema    GERD (gastroesophageal reflux disease)    High triglycerides    Hyperlipidemia    Hypertension    IBS (irritable bowel syndrome)    contipation predominant    Iron deficiency anemia    Multiple sclerosis (HCC) 05/11/1988   Dx in 1990 most recent hospitalization for a flareis in  2011    Obesity     Past Surgical History: Past Surgical History:  Procedure Laterality Date   bilateral cataract surgery      BIOPSY  01/28/2011   Procedure: BIOPSY;  Surgeon: Malissa Hippo, MD;  [facility];  Service: Endoscopy;  Laterality: N/A;   BIOPSY N/A 08/25/2013   Procedure: BIOPSY;  Surgeon: Malissa Hippo, MD;  Location: [facility];  Service: Endoscopy;  Laterality: N/A;   BIOPSY  02/12/2021   Procedure: BIOPSY;  Surgeon: Malissa Hippo, MD;  [facility];  Service: Endoscopy;;   CHOLECYSTECTOMY  05/11/2008   Dr. Gabriel Cirri    COLONOSCOPY WITH PROPOFOL N/A 08/25/2013   Procedure: COLONOSCOPY WITH PROPOFOL;  Surgeon: Malissa Hippo, MD;  Location: [facility];  Service: Endoscopy;  Laterality: N/A;  in cecum at 0811 out at 0822 = 11 minutes   ESOPHAGOGASTRODUODENOSCOPY  01/28/2011   Procedure: ESOPHAGOGASTRODUODENOSCOPY (EGD);  Surgeon: Malissa Hippo, MD;  [facility];  Service: Endoscopy;  Laterality: N/A;  1:00   ESOPHAGOGASTRODUODENOSCOPY  03/24/2021   Procedure: ESOPHAGOGASTRODUODENOSCOPY (EGD);  Surgeon: Luretha Murphy, MD;  Location: [facility];  Service: General;;   ESOPHAGOGASTRODUODENOSCOPY (EGD) WITH PROPOFOL N/A 08/25/2013   Procedure: ESOPHAGOGASTRODUODENOSCOPY (EGD) WITH PROPOFOL;  Surgeon: Malissa Hippo, MD;  Location: [facility];  Service: Endoscopy;  Laterality: N/A;   ESOPHAGOGASTRODUODENOSCOPY (EGD) WITH PROPOFOL N/A 02/12/2021   Procedure: ESOPHAGOGASTRODUODENOSCOPY (EGD) WITH PROPOFOL;  Surgeon: Malissa Hippo, MD;  [facility];  Service: Endoscopy;  Laterality: N/A;  7:30   WISDOM TOOTH EXTRACTION     XI ROBOTIC ASSISTED HIATAL HERNIA REPAIR N/A 03/24/2021   Procedure: XI ROBOTIC ASSISTED HIATAL HERNIA REPAIR WITH FUNDOPLICATION;  Surgeon: Luretha Murphy, MD;  Location: [facility];  Service: General;  Laterality: N/A;    Family History: Family History  Problem Relation Age of Onset   Eczema Father    Asthma  Brother    Asthma Maternal Uncle    Asthma Paternal Uncle    Allergic rhinitis Daughter     Social History: Social History   Tobacco Use  Smoking Status Former   Current packs/day: 0.00   Average packs/day: 1.5 packs/day for 30.0 years (45.0 ttl pk-yrs)   Types: Cigarettes   Start date: 03/08/1978   Quit date: 03/08/2008   Years since quitting: 14.9   Passive exposure: Past  Smokeless Tobacco Never   Social History   Substance and Sexual Activity  Alcohol Use No   Alcohol/week: 0.0 standard drinks of alcohol   Social History   Substance and Sexual Activity  Drug Use No    Allergies: Allergies  Allergen Reactions   Ace Inhibitors Anaphylaxis   Atenolol Anaphylaxis   Losartan Swelling    Severe tongue swelling   Milk-Related Compounds     rash   Penicillins     Childhood Allergy - caused coma  Has patient had a PCN reaction causing immediate rash, facial/tongue/throat swelling, SOB or lightheadedness with hypotension: No Has patient had a PCN reaction causing severe rash involving mucus membranes or skin necrosis: No Has patient had a PCN reaction that  required hospitalization: No Has patient had a PCN reaction occurring within the last 10 years: No If all of the above answers are "NO", then may proceed with Cephalosporin use.     Medications: Current Outpatient Medications  Medication Sig Dispense Refill   amLODipine (NORVASC) 5 MG tablet TAKE 1 TABLET BY MOUTH DAILY 80 tablet 1   ARIPiprazole (ABILIFY) 5 MG tablet Take one tab po at bedtime for depression 30 tablet 0   baclofen (LIORESAL) 10 MG tablet Take 10 mg by mouth 2 (two) times daily.      clobetasol cream (TEMOVATE) 0.05 % Apply 1 Application topically 2 (two) times daily.     diazepam (VALIUM) 10 MG tablet 1 in the morning,1/2 midday, 1/2 late afternoon, 1/2 to 1 each evening   for MS muscle spasms (Patient taking differently: Take 10 mg by mouth every 6 (six) hours as needed. 1 in the morning,1/2  midday, 1/2 late afternoon, 1/2 to 1 each evening   for MS muscle spasms) 90 tablet 2   docusate sodium (COLACE) 100 MG capsule Take 100 mg by mouth 2 (two) times daily.     DULoxetine (CYMBALTA) 60 MG capsule Take 60 mg by mouth daily.      esomeprazole (NEXIUM) 40 MG capsule TAKE 1 CAPSULE BY MOUTH TWICE  DAILY BEFORE A MEAL 180 capsule 3   ferrous sulfate 325 (65 FE) MG EC tablet Take 325 mg by mouth once a week.     fish oil-omega-3 fatty acids 1000 MG capsule Take 1 g by mouth daily.     fluticasone (FLONASE) 50 MCG/ACT nasal spray Place 2 sprays into both nostrils daily. 16 g 5   gabapentin (NEURONTIN) 600 MG tablet Take 1 tablet (600 mg total) by mouth 3 (three) times daily. 90 tablet 5   hydrochlorothiazide (MICROZIDE) 12.5 MG capsule TAKE 1 CAPSULE BY MOUTH DAILY 90 capsule 3   HYDROmorphone (DILAUDID) 2 MG tablet 1 tablet taken 3 times daily as directed 90 tablet 0   HYDROmorphone (DILAUDID) 8 MG tablet Take 8 mg by mouth every 4 (four) hours as needed for severe pain.     modafinil (PROVIGIL) 200 MG tablet TAKE 1 TABLET BY MOUTH IN THE MORNING AND 1 AT NOON 60 tablet 2   multivitamin (THERAGRAN) per tablet Take 1 tablet by mouth daily.     naloxone (NARCAN) nasal spray 4 mg/0.1 mL Use as directed for opioid overdose 1 each 0   nystatin cream (MYCOSTATIN) Apply 1 Application topically 3 (three) times daily.     olopatadine (PATANOL) 0.1 % ophthalmic solution Place 1 drop into both eyes 2 (two) times daily. 5 mL 12   polyethylene glycol powder (GLYCOLAX/MIRALAX) 17 GM/SCOOP powder MIX 25.5 GRAMS WITH WATER OR JUICE ONCE DAILY. (Patient taking differently: Take 25.5 g by mouth daily. MIX 25.5 GRAMS WITH WATER OR JUICE ONCE DAILY.) 510 g 5   potassium chloride (KLOR-CON) 10 MEQ tablet TAKE 1 TABLET BY MOUTH TWICE  DAILY 200 tablet 2   tamsulosin (FLOMAX) 0.4 MG CAPS capsule Take 1 capsule (0.4 mg total) by mouth daily. 30 capsule 11   Vitamin D, Ergocalciferol, (DRISDOL) 1.25 MG (50000  UNIT) CAPS capsule Take 50,000 Units by mouth every 7 (seven) days.     No current facility-administered medications for this visit.    Review of Systems: GENERAL: negative for malaise, night sweats HEENT: No changes in hearing or vision, no nose bleeds or other nasal problems. NECK: Negative for lumps, goiter, pain and  significant neck swelling RESPIRATORY: Negative for cough, wheezing CARDIOVASCULAR: Negative for chest pain, leg swelling, palpitations, orthopnea GI: SEE HPI MUSCULOSKELETAL: Negative for joint pain or swelling, back pain, and muscle pain. SKIN: Negative for lesions, rash PSYCH: Negative for sleep disturbance, mood disorder and recent psychosocial stressors. HEMATOLOGY Negative for prolonged bleeding, bruising easily, and swollen nodes. ENDOCRINE: Negative for cold or heat intolerance, polyuria, polydipsia and goiter. NEURO: negative for tremor, gait imbalance, syncope and seizures. The remainder of the review of systems is noncontributory.   Physical Exam: BP 136/81 (BP Location: Left Arm, Patient Position: Sitting, Cuff Size: Large)   Pulse (!) 123   Temp 98 F (36.7 C) (Temporal)   Ht 5\' 8"  (1.727 m)   Wt 231 lb 9.6 oz (105.1 kg)   BMI 35.21 kg/m  GENERAL: The patient is AO x3, in no acute distress. Obese. Uses cane. HEENT: Head is normocephalic and atraumatic. EOMI are intact. Mouth is well hydrated and without lesions. NECK: Supple. No masses LUNGS: Clear to auscultation. No presence of rhonchi/wheezing/rales. Adequate chest expansion HEART: RRR, normal s1 and s2. ABDOMEN: Soft, nontender, no guarding, no peritoneal signs, and nondistended. BS +. No masses. EXTREMITIES: Without any cyanosis, clubbing, rash, lesions or edema. NEUROLOGIC: AOx3, no focal motor deficit. SKIN: no jaundice, no rashes  Imaging/Labs: as above  I personally reviewed and interpreted the available labs, imaging and endoscopic files.  Impression and Plan: Natalie Campos is a 62  y.o. female with past medical history of GERD complicated by short segment Barrett's esophagus, IBS-C, multiple sclerosis, IBS, who presents for follow up of GERD and dysphagia.  The patient has presented recurrent episodes of heartburn despite taking esomeprazole compliantly.  She has tried other previous medications without adequate relief of her heartburn.  Notably, she underwent surgical correction of her hiatal hernia and Nissen fundoplication, but unfortunately her hiatal hernia recurred which likely has led to her persistent reflux.  We discussed that we could try switching her to other PPIs such as Dexilant or could even consider starting  Voquezna.  She is in agreement to attempt this.  Finally, we discussed that it pulled be reasonable to have an evaluation by her hiatal hernia surgeon again or look for a second opinion as she may need to have a redo Nissen and hernia repair.   I discussed that it would be important to repeat an EGD to assess her dysphagia symptoms.  Notably, her previous esophagram showed diffuse esophageal dysmotility.  Her dysmotility could be possibly related to her multiple sclerosis and unfortunately for this there are not too many pharmacological options.  If her dysphagia becomes very severe, may need to proceed with an esophageal manometry.  The patient also reports having chronic constipation that has been difficult to manage.  I advised her to increase the amount of MiraLAX she takes per day, but if her symptoms do not improve we will need to proceed with prescription laxatives.  -Start taking Miralax 2 capfuls every day. If after two weeks there is no improvement, increase to 2 capfuls in AM and one at night. -Continue with stool softeners x 2/day -Stop esomeprazole and start Dexilant 60 mg every day -Schedule EGD -Will consider referral again to surgical evaluation if persistent symptoms  All questions were answered.      Natalie Blazing, MD Gastroenterology  and Hepatology [facility]

## 2023-02-18 NOTE — Patient Instructions (Addendum)
Start taking Miralax 2 capfuls every day. If after two weeks there is no improvement, increase to 2 capfuls in AM and one at night. Continue with stool softeners x 2/day Stop esomeprazole and start Dexilant 60 mg every day Schedule EGD Will consider referral again to surgical evaluation if persistent symptoms

## 2023-02-23 ENCOUNTER — Other Ambulatory Visit: Payer: Self-pay | Admitting: Family Medicine

## 2023-02-23 ENCOUNTER — Telehealth: Payer: Self-pay | Admitting: Physical Medicine and Rehabilitation

## 2023-02-23 ENCOUNTER — Ambulatory Visit (HOSPITAL_COMMUNITY)
Admission: RE | Admit: 2023-02-23 | Discharge: 2023-02-23 | Disposition: A | Discharge status: Home / Self Care | Payer: Medicare Other | Source: Ambulatory Visit | Attending: Family Medicine | Admitting: Family Medicine

## 2023-02-23 ENCOUNTER — Ambulatory Visit: Payer: Medicare Other | Admitting: Family Medicine

## 2023-02-23 ENCOUNTER — Telehealth (INDEPENDENT_AMBULATORY_CARE_PROVIDER_SITE_OTHER): Payer: Self-pay | Admitting: Gastroenterology

## 2023-02-23 VITALS — BP 124/80 | HR 97 | Temp 97.8°F | Wt 286.6 lb

## 2023-02-23 DIAGNOSIS — R0602 Shortness of breath: Secondary | ICD-10-CM | POA: Insufficient documentation

## 2023-02-23 DIAGNOSIS — G35 Multiple sclerosis: Secondary | ICD-10-CM | POA: Insufficient documentation

## 2023-02-23 MED ORDER — AZITHROMYCIN 250 MG PO TABS: ORAL_TABLET | ORAL | 0 refills | Status: AC

## 2023-02-23 MED ORDER — PREDNISONE 50 MG PO TABS: 50.0000 mg | ORAL_TABLET | Freq: Every day | ORAL | 0 refills | Status: AC

## 2023-02-23 MED ORDER — CEFDINIR 300 MG PO CAPS: 300.0000 mg | ORAL_CAPSULE | Freq: Two times a day (BID) | ORAL | 0 refills | Status: DC

## 2023-02-23 NOTE — Telephone Encounter (Signed)
Left message for pt to return call to get her scheduled for EGD room 3 Castaneda.

## 2023-02-23 NOTE — Patient Instructions (Signed)
Labs and xray today. We will call with results.  I need to see the xray before sending in antibiotics to get the right one(s).  Take care  Dr. Adriana Simas

## 2023-02-23 NOTE — Telephone Encounter (Signed)
LVM informing patient that appointment is cancelled and to return call to reschedule

## 2023-02-23 NOTE — Telephone Encounter (Signed)
Patient husband called and wanted to cancel her appointment for Thursday. She is sick. CB#[phone]

## 2023-02-24 DIAGNOSIS — R0602 Shortness of breath: Secondary | ICD-10-CM | POA: Insufficient documentation

## 2023-02-24 LAB — BASIC METABOLIC PANEL
BUN/Creatinine Ratio: 16 (ref 12–28)
BUN: 13 mg/dL (ref 8–27)
CO2: 28 mmol/L (ref 20–29)
Calcium: 9.5 mg/dL (ref 8.7–10.3)
Chloride: 102 mmol/L (ref 96–106)
Creatinine, Ser: 0.81 mg/dL (ref 0.57–1.00)
Glucose: 102 mg/dL — ABNORMAL HIGH (ref 70–99)
Potassium: 4.2 mmol/L (ref 3.5–5.2)
Sodium: 142 mmol/L (ref 134–144)
eGFR: 81 mL/min/{1.73_m2} (ref >59)

## 2023-02-24 LAB — CBC
Hematocrit: 39.9 % (ref 34.0–46.6)
Hemoglobin: 13.4 g/dL (ref 11.1–15.9)
MCH: 31.7 pg (ref 26.6–33.0)
MCHC: 33.6 g/dL (ref 31.5–35.7)
MCV: 94 fL (ref 79–97)
Platelets: 291 10*3/uL (ref 150–450)
RBC: 4.23 x10E6/uL (ref 3.77–5.28)
RDW: 12.2 % (ref 11.7–15.4)
WBC: 7.3 10*3/uL (ref 3.4–10.8)

## 2023-02-24 NOTE — Assessment & Plan Note (Signed)
Patient overall mildly ill-appearing.  Given history, concern for underlying infectious process.  Chest x-ray was obtained and revealed increased interstitial markings.  No focal infiltrate was noted.  However, I am concerned about pneumonia clinically.  Placing patient on antibiotic therapy.  Labs were obtained and were reassuring.

## 2023-02-24 NOTE — Progress Notes (Signed)
Subjective:  Patient ID: Natalie Campos, female    DOB: 1960/11/19  Age: 62 y.o. MRN: [id number]  CC: Shortness of breath   HPI:  62 year old female presents for evaluation the above.  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  This was on Monday.  On Tuesday she began to feel very poorly.  She reports shortness of breath, sore throat, cough, dizziness, headache, weakness.  She has had some falls associated with her dizziness and weakness.  No documented fever.  Patient feels very poorly.  No relieving factors.  Patient Active Problem List   Diagnosis Date Noted   SOB (shortness of breath) 02/24/2023   MS (multiple sclerosis) (HCC) 02/23/2023   Status post laparoscopic Nissen fundoplication 03/24/2021   Barrett's esophagus 01/17/2019   Hiatal hernia 08/12/2017   GERD (gastroesophageal reflux disease) 09/11/2015   Anemia 08/01/2013   Hyperlipidemia 05/23/2009   Persistent depressive disorder 05/23/2009   Essential hypertension 05/23/2009   EMPHYSEMA 05/23/2009   Irritable bowel syndrome 05/23/2009   DEGENERATIVE DISC DISEASE 05/23/2009    Social Hx   Social History   Socioeconomic History   Marital status: Married    Spouse name: Not on file   Number of children: 3   Years of education: Not on file   Highest education level: GED or equivalent  Occupational History   Occupation: disabled since 2002  Tobacco Use   Smoking status: Former    Current packs/day: 0.00    Average packs/day: 1.5 packs/day for 30.0 years (45.0 ttl pk-yrs)    Types: Cigarettes    Start date: 03/08/1978    Quit date: 03/08/2008    Years since quitting: 14.9    Passive exposure: Past   Smokeless tobacco: Never  Vaping Use   Vaping status: Never Used  Substance and Sexual Activity   Alcohol use: No    Alcohol/week: 0.0 standard drinks of alcohol   Drug use: No   Sexual activity: Yes    Birth control/protection: Post-menopausal  Other Topics Concern   Not on file   Social History Narrative   Two grown children, still birth infant    Social Determinants of Health   Financial Resource Strain: Low Risk  (07/31/2022)   Overall Financial Resource Strain (CARDIA)    Difficulty of Paying Living Expenses: Not very hard  Food Insecurity: No Food Insecurity (11/03/2022)   Received from [facility], [facility]   Hunger Vital Sign    Worried About Running Out of Food in the Last Year: Never true    Ran Out of Food in the Last Year: Never true  Transportation Needs: No Transportation Needs (11/03/2022)   [facility], [facility]   [facility] - Transportation    Lack of Transportation (Medical): No    Lack of Transportation (Non-Medical): No  Physical Activity: Unknown (07/31/2022)   Exercise Vital Sign    Days of Exercise per Week: 1 day    Minutes of Exercise per Session: Patient declined  Stress: Stress Concern Present (07/31/2022)   [employer] of Occupational Health - Occupational Stress Questionnaire    Feeling of Stress : To some extent  Social Connections: Moderately Integrated (07/31/2022)   Social Connection and Isolation Panel [NHANES]    Frequency of Communication with Friends and Family: Three times a week    Frequency of Social Gatherings with Friends and Family: Three times a week    Attends Religious Services: 1 to 4 times  per year    Active Member of Clubs or Organizations: No    Attends Engineer, structural: Not on file    Marital Status: Married    Review of Systems Per HPI  Objective:  BP 124/80   Pulse 97   Temp 97.8 F (36.6 C) (Oral)   Wt 286 lb 9.6 oz (130 kg)   SpO2 98%   BMI 43.58 kg/m      02/23/2023    3:59 PM 02/18/2023   10:40 AM 02/03/2023   11:42 AM  BP/Weight  Systolic BP 124 136 128  Diastolic BP 80 81 81  Wt. (Lbs) 286.6 231.6 229.9  BMI 43.58 kg/m2 35.21 kg/m2 33.95 kg/m2    Physical Exam Constitutional:      Appearance: She is obese.     Comments: Appears  mildly ill.  HENT:     Head: Normocephalic and atraumatic.     Right Ear: Tympanic membrane normal.     Left Ear: Tympanic membrane normal.     Mouth/Throat:     Pharynx: Oropharynx is clear.  Cardiovascular:     Rate and Rhythm: Normal rate and regular rhythm.  Pulmonary:     Effort: Pulmonary effort is normal.     Comments: Basilar crackles. Neurological:     Mental Status: She is alert.     Lab Results  Component Value Date   WBC 7.3 02/23/2023   HGB 13.4 02/23/2023   HCT 39.9 02/23/2023   PLT 291 02/23/2023   GLUCOSE 102 (H) 02/23/2023   CHOL 241 (H) 11/13/2021   TRIG 108 11/13/2021   HDL 92 11/13/2021   LDLCALC 131 (H) 11/13/2021   ALT 14 03/04/2020   AST 17 03/04/2020   NA 142 02/23/2023   K 4.2 02/23/2023   CL 102 02/23/2023   CREATININE 0.81 02/23/2023   BUN 13 02/23/2023   CO2 28 02/23/2023   TSH 1.530 11/13/2021   HGBA1C 5.2 07/06/2014     Assessment & Plan:   Problem List Items Addressed This Visit       Other   SOB (shortness of breath) - Primary    Patient overall mildly ill-appearing.  Given history, concern for underlying infectious process.  Chest x-ray was obtained and revealed increased interstitial markings.  No focal infiltrate was noted.  However, I am concerned about pneumonia clinically.  Placing patient on antibiotic therapy.  Labs were obtained and were reassuring.      Relevant Orders   CBC (Completed)   Basic metabolic panel (Completed)   DG Chest 2 View (Completed)    Follow-up: Later this week/early next week  Everlene Other DO [facility]

## 2023-02-25 ENCOUNTER — Encounter: Payer: Medicare Other | Admitting: Physical Medicine and Rehabilitation

## 2023-02-28 ENCOUNTER — Other Ambulatory Visit: Payer: Self-pay | Admitting: Nurse Practitioner

## 2023-03-08 NOTE — Telephone Encounter (Signed)
Pt left voicemail in regards to scheduling EGD with Dr.Castaneda. Returned call to patient. Pt scheduled for 03/30/23 at 1:15pm. Instructions will be mailed once pre op is scheduled.   Per [employer]: Notification or Prior Authorization is not required for the requested services You are not required to submit a notification/prior authorization based on the information provided. If you have general questions about the prior authorization requirements, visit [email or web address] > Clinician Resources > Advance and Admission Notification Requirements. The number above acknowledges your notification. Please write this reference number down for future reference. If you would like to request an organization determination, please call us at [phone]. Decision ID #: [id number]

## 2023-03-15 ENCOUNTER — Ambulatory Visit: Payer: Medicare Other | Attending: Physician Assistant | Admitting: Physician Assistant

## 2023-03-15 ENCOUNTER — Encounter: Payer: Self-pay | Admitting: Physician Assistant

## 2023-03-15 ENCOUNTER — Telehealth: Payer: Self-pay | Admitting: Pharmacist

## 2023-03-15 VITALS — BP 138/86 | HR 96 | Resp 16 | Ht 67.75 in | Wt 234.0 lb

## 2023-03-15 DIAGNOSIS — Z9889 Other specified postprocedural states: Secondary | ICD-10-CM

## 2023-03-15 DIAGNOSIS — M1712 Unilateral primary osteoarthritis, left knee: Secondary | ICD-10-CM

## 2023-03-15 DIAGNOSIS — E7849 Other hyperlipidemia: Secondary | ICD-10-CM

## 2023-03-15 DIAGNOSIS — Z8719 Personal history of other diseases of the digestive system: Secondary | ICD-10-CM

## 2023-03-15 DIAGNOSIS — M25572 Pain in left ankle and joints of left foot: Secondary | ICD-10-CM

## 2023-03-15 DIAGNOSIS — M816 Localized osteoporosis [Lequesne]: Secondary | ICD-10-CM

## 2023-03-15 DIAGNOSIS — Z5181 Encounter for therapeutic drug level monitoring: Secondary | ICD-10-CM | POA: Diagnosis not present

## 2023-03-15 DIAGNOSIS — F341 Dysthymic disorder: Secondary | ICD-10-CM

## 2023-03-15 DIAGNOSIS — Z7952 Long term (current) use of systemic steroids: Secondary | ICD-10-CM

## 2023-03-15 DIAGNOSIS — M5416 Radiculopathy, lumbar region: Secondary | ICD-10-CM

## 2023-03-15 DIAGNOSIS — M51379 Other intervertebral disc degeneration, lumbosacral region without mention of lumbar back pain or lower extremity pain: Secondary | ICD-10-CM

## 2023-03-15 DIAGNOSIS — K2270 Barrett's esophagus without dysplasia: Secondary | ICD-10-CM

## 2023-03-15 DIAGNOSIS — I10 Essential (primary) hypertension: Secondary | ICD-10-CM

## 2023-03-15 DIAGNOSIS — G35 Multiple sclerosis: Secondary | ICD-10-CM

## 2023-03-15 DIAGNOSIS — E559 Vitamin D deficiency, unspecified: Secondary | ICD-10-CM

## 2023-03-15 DIAGNOSIS — K449 Diaphragmatic hernia without obstruction or gangrene: Secondary | ICD-10-CM

## 2023-03-15 DIAGNOSIS — G8929 Other chronic pain: Secondary | ICD-10-CM

## 2023-03-15 NOTE — Patient Instructions (Addendum)
Lab orders placed for [facility]. Please have labs completed  We will start benefits investigation for TYMLOS. Fax your proof of income to pharmacy team at [phone]  Abaloparatide Injection What is this medication? ABALOPARATIDE (a ball oh PAR a tide) treats osteoporosis. It works by Interior and spatial designer stronger and less likely to break (fracture). This medicine may be used for other purposes; ask your health care provider or pharmacist if you have questions. COMMON BRAND NAME(S): TYMLOS What should I tell my care team before I take this medication? They need to know if you have any of these conditions: Bone disease other than osteoporosis High levels of an enzyme called alkaline phosphatase in the blood High levels of calcium in the blood History of cancer in the bone Kidney stone Paget's disease Parathyroid disease Receiving radiation therapy An unusual or allergic reaction to abaloparatide, other medications, foods, dyes, or preservatives Pregnant or trying to get pregnant Breast-feeding How should I use this medication? This medication is injected under the skin. You will be taught how to prepare and give it. Take it as directed on the prescription label at the same time every day. Keep taking it unless your care team tells you to stop. It is important that you put your used needles and pens in a special sharps container. Do not put them in a trash can. If you do not have a sharps container, call your pharmacist or care team to get one. A special MedGuide will be given to you by the pharmacist with each prescription and refill. Be sure to read this information carefully each time. Talk to your care team about the use of this medication in children. Special care may be needed. Overdosage: If you think you have taken too much of this medicine contact a poison control center or emergency room at once. NOTE: This medicine is only for you. Do not share this medicine with others. What if I  miss a dose? If you miss a dose, take it as soon as you can. If it is almost time for your next dose, take only that dose. Do not take double or extra doses. What may interact with this medication? Interactions have not been studied. This list may not describe all possible interactions. Give your health care provider a list of all the medicines, herbs, non-prescription drugs, or dietary supplements you use. Also tell them if you smoke, drink alcohol, or use illegal drugs. Some items may interact with your medicine. What should I watch for while using this medication? Visit your care team for regular checks on your progress. You may need blood work done while you are taking this medication. This medication may affect your coordination, reaction time, or judgment. Do not drive or operate machinery until you know how this medication affects you. Sit up or stand slowly to reduce the risk of dizzy or fainting spells. Drinking alcohol with this medication can increase the risk of these side effects. You should make sure that you get enough calcium and vitamin D while you are taking this medication. Discuss the foods you eat and the vitamins you take with your care team. Talk to your care team about your risk of cancer. You may be more at risk for certain types of cancers if you take this medication. Do not share pens with anyone, even if the needle is changed. Each pen should only be used by one person. Sharing could cause an infection. What side effects may I notice from receiving this medication?  Side effects that you should report to your care team as soon as possible: Allergic reactions--skin rash, itching, hives, swelling of the face, lips, tongue, or throat High calcium level--increased thirst or amount of urine, nausea, vomiting, confusion, unusual weakness or fatigue, bone pain Kidney stones--blood in the urine, pain or trouble passing urine, pain in the lower back or sides Low blood  pressure--dizziness, feeling faint or lightheaded, blurry vision Side effects that usually do not require medical attention (report these to your care team if they continue or are bothersome): Dizziness Fatigue Headache Nausea Pain, redness, or irritation at injection site This list may not describe all possible side effects. Call your doctor for medical advice about side effects. You may report side effects to FDA at 1-800-FDA-1088. Where should I keep my medication? Keep out of the reach of children and pets. Store unopened pens in the refrigerator between 2 and 8 degrees C (36 and 46 degrees F). Do not freeze. Avoid exposure to heat. Get rid of any unopened medication after the expiration date. After the first use, store your pen for up to 30 days at room temperature between 20 and 25 degrees C (68 and 77 degrees F). Do not store with a needle attached to the pen. Use the pen for only 30 days. Get rid of your pen 30 days after first opening it even if the pen still contains medication. To get rid of medications that are no longer needed or have expired: Take the medication to a medication take-back program. Check with your pharmacy or law enforcement to find a location. If you cannot return the medication, ask your pharmacist or care team how to get rid of this medication safely. NOTE: This sheet is a summary. It may not cover all possible information. If you have questions about this medicine, talk to your doctor, pharmacist, or health care provider.  2024 Elsevier/Gold Standard (2022-07-24 00:00:00)

## 2023-03-15 NOTE — Telephone Encounter (Signed)
Please start Tymlos BIV.  Dose: once daily (pen has 30-day BUD).  Patient completed her portion of [employer] PAP today. They will fax income documents. Provider portion placed on Sherron Ales, PA-C's, desk for signature  Chesley Mires, PharmD, MPH, BCPS, CPP Clinical Pharmacist (Rheumatology and Pulmonology)

## 2023-03-15 NOTE — Telephone Encounter (Signed)
Submitted a Prior Authorization request to [facility] for TYMLOS via CoverMyMeds. Will update once we receive a response.  Key: [id number]

## 2023-03-15 NOTE — Progress Notes (Signed)
Pharmacy Note  Subjective:  Patient presents today with her husband, Jonny Ruiz, to [facility] for follow up office visit.   Patient was seen by the pharmacist for counseling on Tymlos. She is naive to osteoporosis medications. She and her husband have administered both SQ and IM injections for her multiple sclerosis for a number of years  Objective: CMP     Component Value Date/Time   NA 142 02/23/2023 1645   K 4.2 02/23/2023 1645   CL 102 02/23/2023 1645   CO2 28 02/23/2023 1645   GLUCOSE 102 (H) 02/23/2023 1645   GLUCOSE 130 (H) 03/21/2021 0926   BUN 13 02/23/2023 1645   CREATININE 0.81 02/23/2023 1645   CREATININE 0.78 09/18/2013 0949   CALCIUM 9.5 02/23/2023 1645   PROT 7.6 03/04/2020 1446   ALBUMIN 4.8 03/04/2020 1446   AST 17 03/04/2020 1446   ALT 14 03/04/2020 1446   ALKPHOS 114 03/04/2020 1446   BILITOT <0.2 03/04/2020 1446   GFRNONAA >60 03/24/2021 1252   GFRAA 77 03/04/2020 1446    Vitamin D Lab Results  Component Value Date   VD25OH 31.8 06/11/2014   DEXA 12/23/22: [facility] 33% BMD 0.583 T-score -3.4. RFN -2.6, Left RFN -2.6. This is the patients first DEXA. Patient is naive to osteoporosis treatment. Frequent falls due to underlying MS-using walker to assist with ambulation. No recent fractures. Previous forearm fractures during childhood. Family history of osteoporosis-paternal grandmother   Assessment/Plan:    Discussed cost and treatment duration of Tymlos vs Evenity. Ultimately, we decided that based on their household income, that Tymlos would be more cost effective as they would qualify [employer] P[employer]. Additionally, patient prefers self-administered injection to better accommodate for mobility issues associated with multiple sclerosis as well  History of GERD and Barrett's esophagitis so not a candidate for oral bisphosphonates. Patient prefers more aggressive treatment so Reclast would not be preferred as anabolic agent  Counseled patient on purpose,  proper use, storage, and adverse effects of Tymlos. Discussed the Tymlos is PTH peptide agonist which results in stimulation of osteoblast and bone mass. Reviewed that Tymlos treatment course is for 2 years. Discussed that pen is stable for 30 days after first use and at room temperature. Counseled patient that Tymlos is a medication that must be injected once daily and that prescription for pen needles will be sent with Tymlos prescription.  Advised patient to continue taking calcium 1200 mg daily and vitamin D supplement.  Reviewed the most common adverse effects of Tymlos including orthostatic hypotension, hypercalciuria, antibody development (which has no clinical significance), GI upset, injection site reaction, and joint pain/arthralgia.  Discussed injection site reaction management and injection site locations. Discussed alternating injection site.  Discussed management of dizziness (which can occur for up to 4 hours after injection) including adequate hydration, slowly rising from bed/chairs to prevent fals, and taking Tymlos at night if needed.  We walked through [employer] administration with demo pen and pen needle.     Tymlos prescription pending baseline labs and pharmacy benefits investigation. Pt completed PAP application (they will fax income documents). Provider portion placed in Sherron Ales, PA-C's folder for signature. She will have labs completed at [facility] in [place] later this week. Orders released.  All questions encouraged and answered.    Chesley Mires, PharmD, MPH, BCPS, CPP Clinical Pharmacist (Rheumatology and Pulmonology)

## 2023-03-16 ENCOUNTER — Ambulatory Visit: Payer: Medicare Other | Admitting: Physical Medicine and Rehabilitation

## 2023-03-16 ENCOUNTER — Other Ambulatory Visit: Payer: Self-pay

## 2023-03-16 ENCOUNTER — Other Ambulatory Visit (HOSPITAL_COMMUNITY): Payer: Self-pay

## 2023-03-16 DIAGNOSIS — M5416 Radiculopathy, lumbar region: Secondary | ICD-10-CM | POA: Diagnosis not present

## 2023-03-16 MED ORDER — METHYLPREDNISOLONE ACETATE 40 MG/ML IJ SUSP
40.0000 mg | Freq: Once | INTRAMUSCULAR | Status: AC
  Administered 2023-03-16: 40 mg

## 2023-03-16 NOTE — Progress Notes (Signed)
Natalie Campos - 62 y.o. female MRN [id number]  Date of birth: 1961/02/22  Office Visit Note: Visit Date: 03/16/2023 PCP: Babs Sciara, MD Referred by: Babs Sciara, MD  Subjective: Chief Complaint  Patient presents with   Lower Back - Pain   HPI:  Natalie Campos is a 62 y.o. female who comes in today at the request of Ellin Goodie, FNP for planned Right S1-2 Lumbar Transforaminal epidural steroid injection with fluoroscopic guidance.  The patient has failed conservative care including home exercise, medications, time and activity modification.  This injection will be diagnostic and hopefully therapeutic.  Please see requesting physician notes for further details and justification.  ROS Otherwise per HPI.  Assessment & Plan: Visit Diagnoses:    ICD-10-CM   1. Lumbar radiculopathy  M54.16 XR C-ARM NO REPORT    Epidural Steroid injection    methylPREDNISolone acetate (DEPO-MEDROL) injection 40 mg      Plan: No additional findings.   Meds & Orders:  Meds ordered this encounter  Medications   methylPREDNISolone acetate (DEPO-MEDROL) injection 40 mg    Orders Placed This Encounter  Procedures   XR C-ARM NO REPORT   Epidural Steroid injection    Follow-up: Return for visit to requesting provider as needed.   Procedures: No procedures performed  S1 Lumbosacral Transforaminal Epidural Steroid Injection - Sub-Pedicular Approach with Fluoroscopic Guidance   Patient: Natalie Campos      Date of Birth: 1961-02-05 MRN: [id number] PCP: Babs Sciara, MD      Visit Date: 03/16/2023   Universal Protocol:    Date/Time: 11/05/241:03 PM  Consent Given By: the patient  Position:  PRONE  Additional Comments: Vital signs were monitored before and after the procedure. Patient was prepped and draped in the usual sterile fashion. The correct patient, procedure, and site was verified.   Injection Procedure Details:  Procedure Site One Meds Administered:  Meds ordered  this encounter  Medications   methylPREDNISolone acetate (DEPO-MEDROL) injection 40 mg    Laterality: Right  Location/Site:  S1 Foramen   Needle size: 22 ga.  Needle type: Spinal  Needle Placement: Transforaminal  Findings:   -Comments: Excellent flow of contrast along the nerve, nerve root and into the epidural space.  Epidurogram: Contrast epidurogram showed no nerve root cut off or restricted flow pattern.  Procedure Details: After squaring off the sacral end-plate to get a true AP view, the C-arm was positioned so that the best possible view of the S1 foramen was visualized. The soft tissues overlying this structure were infiltrated with 2-3 ml. of 1% Lidocaine without Epinephrine.    The spinal needle was inserted toward the target using a "trajectory" view along the fluoroscope beam.  Under AP and lateral visualization, the needle was advanced so it did not puncture dura. Biplanar projections were used to confirm position. Aspiration was confirmed to be negative for CSF and/or blood. A 1-2 ml. volume of Isovue-250 was injected and flow of contrast was noted at each level. Radiographs were obtained for documentation purposes.   After attaining the desired flow of contrast documented above, a 0.5 to 1.0 ml test dose of 0.25% Marcaine was injected into each respective transforaminal space.  The patient was observed for 90 seconds post injection.  After no sensory deficits were reported, and normal lower extremity motor function was noted,   the above injectate was administered so that equal amounts of the injectate were placed at each foramen (level) into the transforaminal  epidural space.   Additional Comments:  No complications occurred Dressing: Band-Aid with 2 x 2 sterile gauze    Post-procedure details: Patient was observed during the procedure. Post-procedure instructions were reviewed.  Patient left the clinic in stable condition.    Clinical History: CLINICAL  DATA:  Left leg pain.  Back problems for 3 years   EXAM: MRI LUMBAR SPINE WITHOUT CONTRAST   TECHNIQUE: Multiplanar, multisequence MR imaging of the lumbar spine was performed. No intravenous contrast was administered.   COMPARISON:  07/30/2011   FINDINGS: Segmentation:  5 lumbar type vertebrae   Alignment:  Mild scoliosis.   Vertebrae:  No fracture, evidence of discitis, or bone lesion.   Conus medullaris and cauda equina: Conus extends to the L1-2 level. Conus and cauda equina appear normal.   Paraspinal and other soft tissues: Partially covered right renal cyst. No perispinal mass or inflammation noted.   Disc levels:   T12- L1: Unremarkable.   L1-L2: Mild disc bulging.   L2-L3: Mild disc narrowing and bulging. Early facet spurring on the left   L3-L4: Mild disc narrowing and right eccentric bulging. Mild facet spurring.   L4-L5: Degenerative facet spurring.  Mild disc bulging.   L5-S1:Greatest level of degenerative disc narrowing which is right eccentric. Disc bulging and ridging towards the right where there is also greater facet spurring. Moderate right foraminal impingement. Asymmetric right subarticular recess narrowing that could affect the right S1 nerve root.   IMPRESSION: 1. Similar degree of lumbar spine degeneration with mild scoliosis when compared to 2018. 2. Neural impingement limited to the L5-S1 level at the right foramen and subarticular recess     Electronically Signed   By: Marnee Spring M.D.   On: 09/28/2020 16:09     Objective:  VS:  HT:    WT:   BMI:     BP:   HR: bpm  TEMP: ( )  RESP:  Physical Exam Vitals and nursing note reviewed.  Constitutional:      General: She is not in acute distress.    Appearance: Normal appearance. She is not ill-appearing.  HENT:     Head: Normocephalic and atraumatic.     Right Ear: External ear normal.     Left Ear: External ear normal.  Eyes:     Extraocular Movements: Extraocular  movements intact.  Cardiovascular:     Rate and Rhythm: Normal rate.     Pulses: Normal pulses.  Pulmonary:     Effort: Pulmonary effort is normal. No respiratory distress.  Abdominal:     General: There is no distension.     Palpations: Abdomen is soft.  Musculoskeletal:        General: Tenderness present.     Cervical back: Neck supple.     Right lower leg: No edema.     Left lower leg: No edema.     Comments: Patient has good distal strength with no pain over the greater trochanters.  No clonus or focal weakness. Brace on left knee. Ambulates with Walker  Skin:    Findings: No erythema, lesion or rash.  Neurological:     General: No focal deficit present.     Mental Status: She is alert and oriented to person, place, and time.     Sensory: No sensory deficit.     Motor: No weakness or abnormal muscle tone.     Coordination: Coordination normal.  Psychiatric:        Mood and Affect: Mood normal.  Behavior: Behavior normal.      Imaging: No results found.

## 2023-03-16 NOTE — Telephone Encounter (Signed)
Received notification from [facility] regarding a prior authorization for TYMLOS. Authorization has been APPROVED from 03/15/2023 to 05/10/24. Approval letter sent to scan center.  Per test claim, copay for 30 days supply is $830.29  Authorization # [id number] Phone # [phone]  Received signed provider form for [employer] from Sherron Ales, PA-C. Submission pending baseline labs to result and income documents from patient. Scanned application to Onbase.  Chesley Mires, PharmD, MPH, BCPS, CPP Clinical Pharmacist (Rheumatology and Pulmonology)

## 2023-03-16 NOTE — Procedures (Signed)
S1 Lumbosacral Transforaminal Epidural Steroid Injection - Sub-Pedicular Approach with Fluoroscopic Guidance   Patient: Natalie Campos      Date of Birth: [birth year] MRN: [id number] PCP: Babs Sciara, MD      Visit Date: 03/16/2023   Universal Protocol:    Date/Time: 11/05/241:03 PM  Consent Given By: the patient  Position:  PRONE  Additional Comments: Vital signs were monitored before and after the procedure. Patient was prepped and draped in the usual sterile fashion. The correct patient, procedure, and site was verified.   Injection Procedure Details:  Procedure Site One Meds Administered:  Meds ordered this encounter  Medications   methylPREDNISolone acetate (DEPO-MEDROL) injection 40 mg    Laterality: Right  Location/Site:  S1 Foramen   Needle size: 22 ga.  Needle type: Spinal  Needle Placement: Transforaminal  Findings:   -Comments: Excellent flow of contrast along the nerve, nerve root and into the epidural space.  Epidurogram: Contrast epidurogram showed no nerve root cut off or restricted flow pattern.  Procedure Details: After squaring off the sacral end-plate to get a true AP view, the C-arm was positioned so that the best possible view of the S1 foramen was visualized. The soft tissues overlying this structure were infiltrated with 2-3 ml. of 1% Lidocaine without Epinephrine.    The spinal needle was inserted toward the target using a "trajectory" view along the fluoroscope beam.  Under AP and lateral visualization, the needle was advanced so it did not puncture dura. Biplanar projections were used to confirm position. Aspiration was confirmed to be negative for CSF and/or blood. A 1-2 ml. volume of Isovue-250 was injected and flow of contrast was noted at each level. Radiographs were obtained for documentation purposes.   After attaining the desired flow of contrast documented above, a 0.5 to 1.0 ml test dose of 0.25% Marcaine was injected into each  respective transforaminal space.  The patient was observed for 90 seconds post injection.  After no sensory deficits were reported, and normal lower extremity motor function was noted,   the above injectate was administered so that equal amounts of the injectate were placed at each foramen (level) into the transforaminal epidural space.   Additional Comments:  No complications occurred Dressing: Band-Aid with 2 x 2 sterile gauze    Post-procedure details: Patient was observed during the procedure. Post-procedure instructions were reviewed.  Patient left the clinic in stable condition.

## 2023-03-16 NOTE — Progress Notes (Signed)
Functional Pain Scale - descriptive words and definitions  Immobilizing (10)   Unable to move or talk due to intensity of pain/unable to sleep and unable to use distraction. Severe range order  Average Pain 10  R >L +Driver, -BT, -Dye Allergies.

## 2023-03-16 NOTE — Patient Instructions (Signed)

## 2023-03-17 ENCOUNTER — Ambulatory Visit: Payer: Medicare Other | Admitting: Family Medicine

## 2023-03-19 ENCOUNTER — Other Ambulatory Visit: Payer: Self-pay | Admitting: Neurology

## 2023-03-22 ENCOUNTER — Other Ambulatory Visit: Payer: Self-pay

## 2023-03-24 NOTE — Telephone Encounter (Signed)
Received income documents from patient via fax. Submitted Patient Assistance Application to  [employer]  for TYMLOS along with provider portion, patient portion, PA, medication list, insurance card copy and income documents. Will update patient when we receive a response.  Phone:  [phone] Fax: [phone]  Chesley Mires, PharmD, MPH, BCPS, CPP Clinical Pharmacist (Rheumatology and Pulmonology)

## 2023-03-26 NOTE — Patient Instructions (Signed)
Natalie Campos  03/26/2023     @PREFPERIOPPHARMACY @   Your procedure is scheduled on 03/30/2023.  Report to [facility] at 11:00 A.M.  Call this number if you have problems the morning of surgery:  [phone]  If you experience any cold or flu symptoms such as cough, fever, chills, shortness of breath, etc. between now and your scheduled surgery, please notify us at the above number.   Remember:  Please Follow the diet instructions given to you by the office.   You may drink clear liquids until 9:00am .  Clear liquids allowed are:                    Water, Juice (No red color; non-citric and without pulp; diabetics please choose diet or no sugar options), Carbonated beverages (diabetics please choose diet or no sugar options), Clear Tea (No creamer, milk, or cream, including half & half and powdered creamer), Black Coffee Only (No creamer, milk or cream, including half & half and powdered creamer), Plain Jell-O Only (No red color; diabetics please choose no sugar options), Clear Sports drink (No red color; diabetics please choose diet or no sugar options), and Plain Popsicles Only (No red color; diabetics please choose no sugar options)    Take these medicines the morning of surgery with A SIP OF WATER : Norvasc Baclofen Dexilant Flomax Valium Cymbalta Flonase Neurontin    Do not wear jewelry, make-up or nail polish, including gel polish,  artificial nails, or any other type of covering on natural nails (fingers and  toes).  Do not wear lotions, powders, or perfumes, or deodorant.  Do not shave 48 hours prior to surgery.  Men may shave face and neck.  Do not bring valuables to the hospital.  [facility] is not responsible for any belongings or valuables.  Contacts, dentures or bridgework may not be worn into surgery.  Leave your suitcase in the car.  After surgery it may be brought to your room.  For patients admitted to the hospital, discharge time will be determined by your  treatment team.  Patients discharged the day of surgery will not be allowed to drive home.   Name and phone number of your driver:   Family Special instructions:  N/A  Please read over the following fact sheets that you were given. Care and Recovery After Surgery   Upper Endoscopy, Adult Upper endoscopy is a procedure to look inside the upper GI (gastrointestinal) tract. The upper GI tract is made up of: The esophagus. This is the part of the body that moves food from your mouth to your stomach. The stomach. The duodenum. This is the first part of your small intestine. This procedure is also called esophagogastroduodenoscopy (EGD) or gastroscopy. In this procedure, your health care provider passes a thin, flexible tube (endoscope) through your mouth and down your esophagus into your stomach and into your duodenum. A small camera is attached to the end of the tube. Images from the camera appear on a monitor in the exam room. During this procedure, your health care provider may also remove a small piece of tissue to be sent to a lab and examined under a microscope (biopsy). Your health care provider may do an upper endoscopy to diagnose cancers of the upper GI tract. You may also have this procedure to find the cause of other conditions, such as: Stomach pain. Heartburn. Pain or problems when swallowing. Nausea and vomiting. Stomach bleeding. Stomach ulcers. Tell a  health care provider about: Any allergies you have. All medicines you are taking, including vitamins, herbs, eye drops, creams, and over-the-counter medicines. Any problems you or family members have had with anesthetic medicines. Any bleeding problems you have. Any surgeries you have had. Any medical conditions you have. Whether you are pregnant or may be pregnant. What are the risks? Your healthcare provider will talk with you about risks. These may include: Infection. Bleeding. Allergic reactions to medicines. A tear  or hole (perforation) in the esophagus, stomach, or duodenum. What happens before the procedure? When to stop eating and drinking Follow instructions from your health care provider about what you may eat and drink. These may include: 8 hours before your procedure Stop eating most foods. Do not eat meat, fried foods, or fatty foods. Eat only light foods, such as toast or crackers. All liquids are okay except energy drinks and alcohol. 6 hours before your procedure Stop eating. Drink only clear liquids, such as water, clear fruit juice, black coffee, plain tea, and sports drinks. Do not drink energy drinks or alcohol. 2 hours before your procedure Stop drinking all liquids. You may be allowed to take medicines with small sips of water. If you do not follow your health care provider's instructions, your procedure may be delayed or canceled. Medicines Ask your health care provider about: Changing or stopping your regular medicines. This is especially important if you are taking diabetes medicines or blood thinners. Taking medicines such as aspirin and ibuprofen. These medicines can thin your blood. Do not take these medicines unless your health care provider tells you to take them. Taking over-the-counter medicines, vitamins, herbs, and supplements. General instructions If you will be going home right after the procedure, plan to have a responsible adult: Take you home from the hospital or clinic. You will not be allowed to drive. Care for you for the time you are told. What happens during the procedure?  An IV will be inserted into one of your veins. You may be given one or more of the following: A medicine to help you relax (sedative). A medicine to numb the throat (local anesthetic). You will lie on your left side on an exam table. Your health care provider will pass the endoscope through your mouth and down your esophagus. Your health care provider will use the scope to check the  inside of your esophagus, stomach, and duodenum. Biopsies may be taken. The endoscope will be removed. The procedure may vary among health care providers and hospitals. What happens after the procedure? Your blood pressure, heart rate, breathing rate, and blood oxygen level will be monitored until you leave the hospital or clinic. When your throat is no longer numb, you may be given some fluids to drink. If you were given a sedative during the procedure, it can affect you for several hours. Do not drive or operate machinery until your health care provider says that it is safe. It is up to you to get the results of your procedure. Ask your health care provider, or the department that is doing the procedure, when your results will be ready. Contact a health care provider if you: Have a sore throat that lasts longer than 1 day. Have a fever. Get help right away if you: Vomit blood or your vomit looks like coffee grounds. Have bloody, black, or tarry stools. Have a very bad sore throat or you cannot swallow. Have difficulty breathing or very bad pain in your chest or abdomen. These symptoms  may be an emergency. Get help right away. Call 911. Do not wait to see if the symptoms will go away. Do not drive yourself to the hospital. Summary Upper endoscopy is a procedure to look inside the upper GI tract. During the procedure, an IV will be inserted into one of your veins. You may be given a medicine to help you relax. The endoscope will be passed through your mouth and down your esophagus. Follow instructions from your health care provider about what you can eat and drink. This information is not intended to replace advice given to you by your health care provider. Make sure you discuss any questions you have with your health care provider. Document Revised: 08/06/2021 Document Reviewed: 08/06/2021 Elsevier Patient Education  2024 [employer]  Monitored Anesthesia Care Anesthesia refers to  the techniques, procedures, and medicines that help a person stay safe and comfortable during surgery. Monitored anesthesia care, or sedation, is one type of anesthesia. You may have sedation if you do not need to be asleep for your procedure. Procedures that use sedation may include: Surgery to remove cataracts from your eyes. A dental procedure. A biopsy. This is when a tissue sample is removed and looked at under a microscope. You will be watched closely during your procedure. Your level of sedation or type of anesthesia may be changed to fit your needs. Tell a health care provider about: Any allergies you have. All medicines you are taking, including vitamins, herbs, eye drops, creams, and over-the-counter medicines. Any problems you or family members have had with anesthesia. Any bleeding problems you have. Any surgeries you have had. Any medical conditions or illnesses you have. This includes sleep apnea, cough, fever, or the flu. Whether you are pregnant or may be pregnant. Whether you use cigarettes, alcohol, or drugs. Any use of steroids, whether by mouth or as a cream. What are the risks? Your health care provider will talk with you about risks. These may include: Getting too much medicine (oversedation). Nausea. Allergic reactions to medicines. Trouble breathing. If this happens, a breathing tube may be used to help you breathe. It will be removed when you are awake and breathing on your own. Heart trouble. Lung trouble. Confusion that gets better with time (emergence delirium). What happens before the procedure? When to stop eating and drinking Follow instructions from your health care provider about what you may eat and drink. These may include: 8 hours before your procedure Stop eating most foods. Do not eat meat, fried foods, or fatty foods. Eat only light foods, such as toast or crackers. All liquids are okay except energy drinks and alcohol. 6 hours before your  procedure Stop eating. Drink only clear liquids, such as water, clear fruit juice, black coffee, plain tea, and sports drinks. Do not drink energy drinks or alcohol. 2 hours before your procedure Stop drinking all liquids. You may be allowed to take medicines with small sips of water. If you do not follow your health care provider's instructions, your procedure may be delayed or canceled. Medicines Ask your health care provider about: Changing or stopping your regular medicines. These include any diabetes medicines or blood thinners you take. Taking medicines such as aspirin and ibuprofen. These medicines can thin your blood. Do not take them unless your health care provider tells you to. Taking over-the-counter medicines, vitamins, herbs, and supplements. Testing You may have an exam or testing. You may have a blood or urine sample taken. General instructions Do not use any  products that contain nicotine or tobacco for at least 4 weeks before the procedure. These products include cigarettes, chewing tobacco, and vaping devices, such as e-cigarettes. If you need help quitting, ask your health care provider. If you will be going home right after the procedure, plan to have a responsible adult: Take you home from the hospital or clinic. You will not be allowed to drive. Care for you for the time you are told. What happens during the procedure?  Your blood pressure, heart rate, breathing, level of pain, and blood oxygen level will be monitored. An IV will be inserted into one of your veins. You may be given: A sedative. This helps you relax. Anesthesia. This will: Numb certain areas of your body. Make you fall asleep for surgery. You will be given medicines as needed to keep you comfortable. The more medicine you are given, the deeper your level of sedation will be. Your level of sedation may be changed to fit your needs. There are three levels of sedation: Mild sedation. At this level,  you may feel awake and relaxed. You will be able to follow directions. Moderate sedation. At this level, you will be sleepy. You may not remember the procedure. Deep sedation. At this level, you will be asleep. You will not remember the procedure. How you get the medicines will depend on your age and the procedure. They may be given as: A pill. This may be taken by mouth (orally) or inserted into the rectum. An injection. This may be into a vein or muscle. A spray through the nose. After your procedure is over, the medicine will be stopped. The procedure may vary among health care providers and hospitals. What happens after the procedure? Your blood pressure, heart rate, breathing rate, and blood oxygen level will be monitored until you leave the hospital or clinic. You may feel sleepy, clumsy, or nauseous. You may not remember what happened during or after the procedure. Sedation can affect you for several hours. Do not drive or use machinery until your health care provider says that it is safe. This information is not intended to replace advice given to you by your health care provider. Make sure you discuss any questions you have with your health care provider. Document Revised: 09/22/2021 Document Reviewed: 09/22/2021 Elsevier Patient Education  2024 [employer].

## 2023-03-29 ENCOUNTER — Encounter (HOSPITAL_COMMUNITY)
Admission: RE | Admit: 2023-03-29 | Discharge: 2023-03-29 | Disposition: A | Discharge status: Home / Self Care | Payer: Medicare Other | Source: Ambulatory Visit | Attending: Gastroenterology | Admitting: Gastroenterology

## 2023-03-29 ENCOUNTER — Encounter (HOSPITAL_COMMUNITY): Payer: Self-pay

## 2023-03-29 VITALS — BP 138/86 | HR 96 | Temp 97.8°F | Resp 18 | Ht 67.5 in | Wt 234.0 lb

## 2023-03-29 DIAGNOSIS — T502X5A Adverse effect of carbonic-anhydrase inhibitors, benzothiadiazides and other diuretics, initial encounter: Secondary | ICD-10-CM | POA: Diagnosis not present

## 2023-03-29 DIAGNOSIS — Z01810 Encounter for preprocedural cardiovascular examination: Secondary | ICD-10-CM | POA: Diagnosis present

## 2023-03-29 DIAGNOSIS — E876 Hypokalemia: Secondary | ICD-10-CM | POA: Diagnosis not present

## 2023-03-29 DIAGNOSIS — Z01818 Encounter for other preprocedural examination: Secondary | ICD-10-CM | POA: Insufficient documentation

## 2023-03-29 DIAGNOSIS — D500 Iron deficiency anemia secondary to blood loss (chronic): Secondary | ICD-10-CM | POA: Diagnosis not present

## 2023-03-29 DIAGNOSIS — I10 Essential (primary) hypertension: Secondary | ICD-10-CM | POA: Insufficient documentation

## 2023-03-29 DIAGNOSIS — Z01812 Encounter for preprocedural laboratory examination: Secondary | ICD-10-CM | POA: Diagnosis present

## 2023-03-29 LAB — CBC WITH DIFFERENTIAL/PLATELET
Abs Immature Granulocytes: 0.03 10*3/uL (ref 0.00–0.07)
Basophils Absolute: 0.1 10*3/uL (ref 0.0–0.1)
Basophils Relative: 1 %
Eosinophils Absolute: 0.5 10*3/uL (ref 0.0–0.5)
Eosinophils Relative: 7 %
HCT: 42 % (ref 36.0–46.0)
Hemoglobin: 14.3 g/dL (ref 12.0–15.0)
Immature Granulocytes: 0 %
Lymphocytes Relative: 25 %
Lymphs Abs: 1.8 10*3/uL (ref 0.7–4.0)
MCH: 32.1 pg (ref 26.0–34.0)
MCHC: 34 g/dL (ref 30.0–36.0)
MCV: 94.4 fL (ref 80.0–100.0)
Monocytes Absolute: 0.6 10*3/uL (ref 0.1–1.0)
Monocytes Relative: 9 %
Neutro Abs: 4.1 10*3/uL (ref 1.7–7.7)
Neutrophils Relative %: 58 %
Platelets: 333 10*3/uL (ref 150–400)
RBC: 4.45 MIL/uL (ref 3.87–5.11)
RDW: 12.3 % (ref 11.5–15.5)
WBC: 7.1 10*3/uL (ref 4.0–10.5)
nRBC: 0 % (ref 0.0–0.2)

## 2023-03-29 LAB — BASIC METABOLIC PANEL
Anion gap: 10 (ref 5–15)
BUN: 19 mg/dL (ref 8–23)
CO2: 27 mmol/L (ref 22–32)
Calcium: 9.4 mg/dL (ref 8.9–10.3)
Chloride: 102 mmol/L (ref 98–111)
Creatinine, Ser: 0.69 mg/dL (ref 0.44–1.00)
GFR, Estimated: >60 mL/min (ref >60)
Glucose, Bld: 119 mg/dL — ABNORMAL HIGH (ref 70–99)
Potassium: 3.6 mmol/L (ref 3.5–5.1)
Sodium: 139 mmol/L (ref 135–145)

## 2023-03-29 NOTE — Telephone Encounter (Addendum)
Received a fax from   [employer]  regarding an approval for [employer] patient assistance from 03/26/2023 to 05/10/2024. Approval letter sent to scan center.  Phone: [phone], M-F 8am-6pm Case ID: [id number]  I spoke with patient regarding approval. She states she spoke with a rep last Friday. Provided her with phone number for company. She states that she may be getting shipment on 04/01/2023  MyChart message sent to patient for  Chesley Mires, PharmD, MPH, BCPS, CPP Clinical Pharmacist (Rheumatology and Pulmonology)

## 2023-03-30 ENCOUNTER — Encounter (HOSPITAL_COMMUNITY): Payer: Self-pay | Admitting: Gastroenterology

## 2023-03-30 ENCOUNTER — Encounter (HOSPITAL_COMMUNITY)
Admission: RE | Disposition: A | Discharge status: Home / Self Care | Payer: Self-pay | Source: Home / Self Care | Attending: Gastroenterology

## 2023-03-30 ENCOUNTER — Ambulatory Visit (HOSPITAL_BASED_OUTPATIENT_CLINIC_OR_DEPARTMENT_OTHER): Payer: Medicare Other | Admitting: Anesthesiology

## 2023-03-30 ENCOUNTER — Ambulatory Visit (HOSPITAL_COMMUNITY): Payer: Medicare Other | Admitting: Anesthesiology

## 2023-03-30 ENCOUNTER — Other Ambulatory Visit: Payer: Self-pay

## 2023-03-30 ENCOUNTER — Ambulatory Visit (HOSPITAL_COMMUNITY)
Admission: RE | Admit: 2023-03-30 | Discharge: 2023-03-30 | Disposition: A | Discharge status: Home / Self Care | Payer: Medicare Other | Attending: Gastroenterology | Admitting: Gastroenterology

## 2023-03-30 DIAGNOSIS — K589 Irritable bowel syndrome without diarrhea: Secondary | ICD-10-CM | POA: Insufficient documentation

## 2023-03-30 DIAGNOSIS — R1310 Dysphagia, unspecified: Secondary | ICD-10-CM

## 2023-03-30 DIAGNOSIS — K219 Gastro-esophageal reflux disease without esophagitis: Secondary | ICD-10-CM

## 2023-03-30 DIAGNOSIS — J449 Chronic obstructive pulmonary disease, unspecified: Secondary | ICD-10-CM | POA: Insufficient documentation

## 2023-03-30 DIAGNOSIS — R1110 Vomiting, unspecified: Secondary | ICD-10-CM | POA: Diagnosis not present

## 2023-03-30 DIAGNOSIS — I10 Essential (primary) hypertension: Secondary | ICD-10-CM

## 2023-03-30 DIAGNOSIS — Z87891 Personal history of nicotine dependence: Secondary | ICD-10-CM | POA: Insufficient documentation

## 2023-03-30 DIAGNOSIS — Z6836 Body mass index (BMI) 36.0-36.9, adult: Secondary | ICD-10-CM | POA: Diagnosis not present

## 2023-03-30 DIAGNOSIS — Z9889 Other specified postprocedural states: Secondary | ICD-10-CM | POA: Insufficient documentation

## 2023-03-30 DIAGNOSIS — K2270 Barrett's esophagus without dysplasia: Secondary | ICD-10-CM

## 2023-03-30 DIAGNOSIS — E669 Obesity, unspecified: Secondary | ICD-10-CM | POA: Diagnosis not present

## 2023-03-30 DIAGNOSIS — K449 Diaphragmatic hernia without obstruction or gangrene: Secondary | ICD-10-CM | POA: Insufficient documentation

## 2023-03-30 DIAGNOSIS — G35 Multiple sclerosis: Secondary | ICD-10-CM | POA: Insufficient documentation

## 2023-03-30 HISTORY — PX: ESOPHAGOGASTRODUODENOSCOPY (EGD) WITH PROPOFOL: SHX5813

## 2023-03-30 SURGERY — ESOPHAGOGASTRODUODENOSCOPY (EGD) WITH PROPOFOL
Anesthesia: General

## 2023-03-30 MED ORDER — LACTATED RINGERS IV SOLN: INTRAVENOUS | Status: DC

## 2023-03-30 MED ORDER — PROPOFOL 10 MG/ML IV BOLUS
INTRAVENOUS | Status: DC | PRN
  Administered 2023-03-30: 40 mg via INTRAVENOUS
  Administered 2023-03-30: 100 mg via INTRAVENOUS

## 2023-03-30 MED ORDER — DEXLANSOPRAZOLE 60 MG PO CPDR: 60.0000 mg | DELAYED_RELEASE_CAPSULE | Freq: Two times a day (BID) | ORAL | 3 refills | Status: DC

## 2023-03-30 MED ORDER — LIDOCAINE HCL (CARDIAC) PF 100 MG/5ML IV SOSY
PREFILLED_SYRINGE | INTRAVENOUS | Status: DC | PRN
  Administered 2023-03-30: 60 mg via INTRAVENOUS

## 2023-03-30 NOTE — Transfer of Care (Signed)
Immediate Anesthesia Transfer of Care Note  Patient: Natalie Campos  Procedure(s) Performed: ESOPHAGOGASTRODUODENOSCOPY (EGD) WITH PROPOFOL  Patient Location: Short Stay  Anesthesia Type:General  Level of Consciousness: awake and patient cooperative  Airway & Oxygen Therapy: Patient Spontanous Breathing  Post-op Assessment: Report given to RN and Post -op Vital signs reviewed and stable  Post vital signs: Reviewed and stable  Last Vitals:  Vitals Value Taken Time  BP 106/55 03/30/23  1324  Temp 97.4 03/30/23  1324  Pulse 71  11-19/24  13 24   Resp 13 03/30/23  1324  SpO2 95 03/30/23  1324    Last Pain:  Vitals:   03/30/23 1306  TempSrc:   PainSc: 8       Patients Stated Pain Goal: 8 (03/30/23 1231)  Complications: No notable events documented.

## 2023-03-30 NOTE — H&P (Signed)
Natalie Campos is an 62 y.o. female.   Chief Complaint: GERD, regurgitation HPI: Natalie Campos is a 62 y.o. female with past medical history of GERD complicated by short segment Barrett's esophagus, IBS-C, multiple sclerosis, IBS, who presents for follow up of GERD and choking.   The patient has presented intermittent issues with regurgitation and heartburn despite taking Dexilant.  States that the medication has helped some but has not complete control of her symptoms.  Still presenting choking episodes.  Past Medical History:  Diagnosis Date   Anemia    Angio-edema    Anxiety    Barrett's esophagus    Bulging discs    Cataract    Complication of anesthesia    Complication of anesthesia    patietn wakes up easily- has anxiety due to waking up during a simple procedure   Depression    DJD (degenerative joint disease)    Dog bite(E906.0) 12/06/2009   Eczema    GERD (gastroesophageal reflux disease)    High triglycerides    Hyperlipidemia    Hypertension    IBS (irritable bowel syndrome)    contipation predominant    Iron deficiency anemia    Multiple sclerosis (HCC) 05/11/1988   Dx in 1990 most recent hospitalization for a flareis in 2011    Obesity    Osteoporosis 02/2023    Past Surgical History:  Procedure Laterality Date   bilateral cataract surgery      BIOPSY  01/28/2011   Procedure: BIOPSY;  Surgeon: Malissa Hippo, MD;  [facility];  Service: Endoscopy;  Laterality: N/A;   BIOPSY N/A 08/25/2013   Procedure: BIOPSY;  Surgeon: Malissa Hippo, MD;  Location: [facility];  Service: Endoscopy;  Laterality: N/A;   BIOPSY  02/12/2021   Procedure: BIOPSY;  Surgeon: Malissa Hippo, MD;  [facility];  Service: Endoscopy;;   CHOLECYSTECTOMY  05/11/2008   Dr. Gabriel Cirri    COLONOSCOPY WITH PROPOFOL N/A 08/25/2013   Procedure: COLONOSCOPY WITH PROPOFOL;  Surgeon: Malissa Hippo, MD;  Location: [facility];  Service: Endoscopy;  Laterality: N/A;  in cecum at  0811 out at 0822 = 11 minutes   ESOPHAGOGASTRODUODENOSCOPY  01/28/2011   Procedure: ESOPHAGOGASTRODUODENOSCOPY (EGD);  Surgeon: Malissa Hippo, MD;  [facility];  Service: Endoscopy;  Laterality: N/A;  1:00   ESOPHAGOGASTRODUODENOSCOPY  03/24/2021   Procedure: ESOPHAGOGASTRODUODENOSCOPY (EGD);  Surgeon: Luretha Murphy, MD;  Location: [facility];  Service: General;;   ESOPHAGOGASTRODUODENOSCOPY (EGD) WITH PROPOFOL N/A 08/25/2013   Procedure: ESOPHAGOGASTRODUODENOSCOPY (EGD) WITH PROPOFOL;  Surgeon: Malissa Hippo, MD;  Location: [facility];  Service: Endoscopy;  Laterality: N/A;   ESOPHAGOGASTRODUODENOSCOPY (EGD) WITH PROPOFOL N/A 02/12/2021   Procedure: ESOPHAGOGASTRODUODENOSCOPY (EGD) WITH PROPOFOL;  Surgeon: Malissa Hippo, MD;  [facility];  Service: Endoscopy;  Laterality: N/A;  7:30   WISDOM TOOTH EXTRACTION     XI ROBOTIC ASSISTED HIATAL HERNIA REPAIR N/A 03/24/2021   Procedure: XI ROBOTIC ASSISTED HIATAL HERNIA REPAIR WITH FUNDOPLICATION;  Surgeon: Luretha Murphy, MD;  Location: [facility];  Service: General;  Laterality: N/A;    Family History  Problem Relation Age of Onset   COPD Mother    Alcoholism Mother    Mental illness Mother    Alcoholism Father    Eczema Father    Asthma Sister    Hypertension Sister    Asthma Brother    Asthma Maternal Uncle    Asthma Paternal Uncle    Allergic  rhinitis Daughter    Ulcerative colitis Daughter    Asthma Daughter    Healthy Daughter    Irritable bowel syndrome Daughter    Social History:  reports that she quit smoking about 15 years ago. Her smoking use included cigarettes. She started smoking about 45 years ago. She has a 45 pack-year smoking history. She has been exposed to tobacco smoke. She has never used smokeless tobacco. She reports that she does not drink alcohol and does not use drugs.  Allergies:  Allergies  Allergen Reactions   Ace Inhibitors Anaphylaxis   Atenolol Anaphylaxis   Losartan Swelling     Severe tongue swelling   Milk-Related Compounds     rash   Penicillins     Childhood Allergy - caused coma  Has patient had a PCN reaction causing immediate rash, facial/tongue/throat swelling, SOB or lightheadedness with hypotension: No Has patient had a PCN reaction causing severe rash involving mucus membranes or skin necrosis: No Has patient had a PCN reaction that required hospitalization: No Has patient had a PCN reaction occurring within the last 10 years: No If all of the above answers are "NO", then may proceed with Cephalosporin use.     Medications Prior to Admission  Medication Sig Dispense Refill   amLODipine (NORVASC) 5 MG tablet TAKE 1 TABLET BY MOUTH DAILY 80 tablet 1   ARIPiprazole (ABILIFY) 5 MG tablet TAKE 1 TABLET BY MOUTH AT BEDTIME FOR DEPRESSION 90 tablet 0   baclofen (LIORESAL) 10 MG tablet Take 10 mg by mouth 2 (two) times daily.      cefdinir (OMNICEF) 300 MG capsule Take 1 capsule (300 mg total) by mouth 2 (two) times daily. (Patient not taking: Reported on 03/15/2023) 14 capsule 0   clobetasol cream (TEMOVATE) 0.05 % Apply 1 Application topically as needed.     dexlansoprazole (DEXILANT) 60 MG capsule Take 1 capsule (60 mg total) by mouth daily. 90 capsule 3   diazepam (VALIUM) 10 MG tablet 1 in the morning,1/2 midday, 1/2 late afternoon, 1/2 to 1 each evening   for MS muscle spasms (Patient taking differently: Take 10 mg by mouth every 6 (six) hours as needed. 1 in the morning,1/2 midday, 1/2 late afternoon, 1/2 to 1 each evening   for MS muscle spasms) 90 tablet 2   docusate sodium (COLACE) 100 MG capsule Take 100 mg by mouth 2 (two) times daily.     DULoxetine (CYMBALTA) 60 MG capsule Take 60 mg by mouth daily.      ferrous sulfate 325 (65 FE) MG EC tablet Take 325 mg by mouth once a week.     fish oil-omega-3 fatty acids 1000 MG capsule Take 1 g by mouth daily.     fluticasone (FLONASE) 50 MCG/ACT nasal spray Place 2 sprays into both nostrils daily. 16 g 5    gabapentin (NEURONTIN) 600 MG tablet Take 1 tablet (600 mg total) by mouth 3 (three) times daily. 90 tablet 5   hydrochlorothiazide (MICROZIDE) 12.5 MG capsule TAKE 1 CAPSULE BY MOUTH DAILY 90 capsule 3   HYDROmorphone (DILAUDID) 2 MG tablet 1 tablet taken 3 times daily as directed (Patient not taking: Reported on 03/15/2023) 90 tablet 0   HYDROmorphone (DILAUDID) 4 MG tablet Take 4 mg by mouth 3 (three) times daily.     HYDROmorphone (DILAUDID) 8 MG tablet Take 8 mg by mouth every 4 (four) hours as needed for severe pain. (Patient not taking: Reported on 03/15/2023)     HYDROmorphone HCl (EXALGO)  12 MG TB24 Take 12 mg by mouth daily.     modafinil (PROVIGIL) 200 MG tablet TAKE 1 TABLET BY MOUTH IN THE MORNING AND 1 AT NOON 60 tablet 2   multivitamin (THERAGRAN) per tablet Take 1 tablet by mouth daily.     naloxone (NARCAN) nasal spray 4 mg/0.1 mL Use as directed for opioid overdose 1 each 0   nystatin (MYCOSTATIN/NYSTOP) powder SMARTSIG:Packet(s) Topical 4 Times Daily     nystatin cream (MYCOSTATIN) Apply 1 Application topically as needed.     olopatadine (PATANOL) 0.1 % ophthalmic solution Place 1 drop into both eyes 2 (two) times daily. (Patient not taking: Reported on 03/15/2023) 5 mL 12   polyethylene glycol powder (GLYCOLAX/MIRALAX) 17 GM/SCOOP powder MIX 25.5 GRAMS WITH WATER OR JUICE ONCE DAILY. (Patient taking differently: Take 25.5 g by mouth daily. MIX 25.5 GRAMS WITH WATER OR JUICE ONCE DAILY.) 510 g 5   potassium chloride (KLOR-CON) 10 MEQ tablet TAKE 1 TABLET BY MOUTH TWICE  DAILY 200 tablet 2   tamsulosin (FLOMAX) 0.4 MG CAPS capsule Take 1 capsule by mouth once daily 30 capsule 0   Vitamin D, Ergocalciferol, (DRISDOL) 1.25 MG (50000 UNIT) CAPS capsule Take 50,000 Units by mouth every 7 (seven) days.      Results for orders placed or performed during the hospital encounter of 03/29/23 (from the past 48 hour(s))  Basic metabolic panel     Status: Abnormal   Collection Time: 03/29/23  11:35 AM  Result Value Ref Range   Sodium 139 135 - 145 mmol/L   Potassium 3.6 3.5 - 5.1 mmol/L   Chloride 102 98 - 111 mmol/L   CO2 27 22 - 32 mmol/L   Glucose, Bld 119 (H) 70 - 99 mg/dL    Comment: Glucose reference range applies only to samples taken after fasting for at least 8 hours.   BUN 19 8 - 23 mg/dL   Creatinine, Ser 1.91 0.44 - 1.00 mg/dL   Calcium 9.4 8.9 - 47.8 mg/dL   GFR, Estimated >29 >56 mL/min    Comment: (NOTE) Calculated using the CKD-EPI Creatinine Equation (2021)    Anion gap 10 5 - 15    Comment: Performed at [facility], [address] [place], [place] [address]  CBC with Differential/Platelet     Status: None   Collection Time: 03/29/23 11:35 AM  Result Value Ref Range   WBC 7.1 4.0 - 10.5 K/uL   RBC 4.45 3.87 - 5.11 MIL/uL   Hemoglobin 14.3 12.0 - 15.0 g/dL   HCT 65.7 84.6 - 96.2 %   MCV 94.4 80.0 - 100.0 fL   MCH 32.1 26.0 - 34.0 pg   MCHC 34.0 30.0 - 36.0 g/dL   RDW 95.2 84.1 - 32.4 %   Platelets 333 150 - 400 K/uL   nRBC 0.0 0.0 - 0.2 %   Neutrophils Relative % 58 %   Neutro Abs 4.1 1.7 - 7.7 K/uL   Lymphocytes Relative 25 %   Lymphs Abs 1.8 0.7 - 4.0 K/uL   Monocytes Relative 9 %   Monocytes Absolute 0.6 0.1 - 1.0 K/uL   Eosinophils Relative 7 %   Eosinophils Absolute 0.5 0.0 - 0.5 K/uL   Basophils Relative 1 %   Basophils Absolute 0.1 0.0 - 0.1 K/uL   Immature Granulocytes 0 %   Abs Immature Granulocytes 0.03 0.00 - 0.07 K/uL    Comment: Performed at [facility], [address], [place], [place] [address]   No  results found.  Review of Systems  Gastrointestinal:  Positive for abdominal pain.  All other systems reviewed and are negative.   Blood pressure (!) 159/74, pulse 75, temperature 98.2 F (36.8 C), temperature source Oral, resp. rate 15, SpO2 95%. Physical Exam  GENERAL: The patient is AO x3, in no acute distress. HEENT: Head is normocephalic and atraumatic. EOMI are intact. Mouth is well hydrated and without  lesions. NECK: Supple. No masses LUNGS: Clear to auscultation. No presence of rhonchi/wheezing/rales. Adequate chest expansion HEART: RRR, normal s1 and s2. ABDOMEN: Soft, nontender, no guarding, no peritoneal signs, and nondistended. BS +. No masses. EXTREMITIES: Without any cyanosis, clubbing, rash, lesions or edema. NEUROLOGIC: AOx3, no focal motor deficit. SKIN: no jaundice, no rashes  Assessment/Plan MAEVEN LAWE is a 62 y.o. female with past medical history of GERD complicated by short segment Barrett's esophagus, IBS-C, multiple sclerosis, IBS, who presents for follow up of GERD and choking.  Will proceed with EGD.  Dolores Frame, MD 03/30/2023, 12:35 PM

## 2023-03-30 NOTE — Discharge Instructions (Signed)
You are being discharged to home.  Resume your previous diet.  Continue your present medications.  Will refer for evaluation by [facility] for redo Nissen fundoplication and hiatal hernia repair.

## 2023-03-30 NOTE — Anesthesia Preprocedure Evaluation (Signed)
Anesthesia Evaluation  Patient identified by MRN, date of birth, ID band Patient awake    Reviewed: Allergy & Precautions, H&P , NPO status , Patient's Chart, lab work & pertinent test results, reviewed documented beta blocker date and time   History of Anesthesia Complications (+) history of anesthetic complications  Airway Mallampati: II  TM Distance: >3 FB Neck ROM: full    Dental no notable dental hx.    Pulmonary neg pulmonary ROS, COPD, former smoker   Pulmonary exam normal breath sounds clear to auscultation       Cardiovascular Exercise Tolerance: Good hypertension, negative cardio ROS  Rhythm:regular Rate:Normal     Neuro/Psych  PSYCHIATRIC DISORDERS Anxiety Depression     Neuromuscular disease negative neurological ROS  negative psych ROS   GI/Hepatic negative GI ROS, Neg liver ROS, hiatal hernia,GERD  ,,  Endo/Other  negative endocrine ROS    Renal/GU negative Renal ROS  negative genitourinary   Musculoskeletal   Abdominal   Peds  Hematology negative hematology ROS (+) Blood dyscrasia, anemia   Anesthesia Other Findings   Reproductive/Obstetrics negative OB ROS                             Anesthesia Physical Anesthesia Plan  ASA: 3  Anesthesia Plan: General   Post-op Pain Management:    Induction:   PONV Risk Score and Plan: Propofol infusion  Airway Management Planned:   Additional Equipment:   Intra-op Plan:   Post-operative Plan:   Informed Consent: I have reviewed the patients History and Physical, chart, labs and discussed the procedure including the risks, benefits and alternatives for the proposed anesthesia with the patient or authorized representative who has indicated his/her understanding and acceptance.     Dental Advisory Given  Plan Discussed with: CRNA  Anesthesia Plan Comments:        Anesthesia Quick Evaluation

## 2023-03-30 NOTE — Anesthesia Procedure Notes (Signed)
Date/Time: 03/30/2023 1:05 PM  Performed by: Franco Nones, CRNAPre-anesthesia Checklist: Patient identified, Emergency Drugs available, Suction available, Timeout performed and Patient being monitored Patient Re-evaluated:Patient Re-evaluated prior to induction Oxygen Delivery Method: Nasal cannula Comments: Optiflow

## 2023-03-30 NOTE — Op Note (Signed)
[facility] Patient Name: Natalie Campos Procedure Date: 03/30/2023 12:54 PM MRN: [id number] Date of Birth: [birth year] Attending MD: Katrinka Blazing , , [id number] CSN: [id number] Age: 62 Admit Type: Outpatient Procedure:                Upper GI endoscopy Indications:              Gastro-esophageal reflux disease, Regurgitation Providers:                Katrinka Blazing, Edrick Kins, RN, Francoise Ceo RN,                            RN, Dyann Ruddle Referring MD:              Medicines:                Monitored Anesthesia Care Complications:            No immediate complications. Estimated Blood Loss:     Estimated blood loss: none. Procedure:                Pre-Anesthesia Assessment:                           - Prior to the procedure, a History and Physical                            was performed, and patient medications, allergies                            and sensitivities were reviewed. The patient's                            tolerance of previous anesthesia was reviewed.                           - The risks and benefits of the procedure and the                            sedation options and risks were discussed with the                            patient. All questions were answered and informed                            consent was obtained.                           - ASA Grade Assessment: III - A patient with severe                            systemic disease.                           After obtaining informed consent, the endoscope was                            passed under direct vision. Throughout  the                            procedure, the patient's blood pressure, pulse, and                            oxygen saturations were monitored continuously. The                            [id number]-H190 ([id number]) scope was introduced through the                            mouth, and advanced to the second part of duodenum.                            The upper GI endoscopy  was accomplished without                            difficulty. The patient tolerated the procedure                            well. Scope In: 1:12:56 PM Scope Out: 1:16:28 PM Total Procedure Duration: 0 hours 3 minutes 32 seconds  Findings:      A 3 cm hiatal hernia was found. The hiatal narrowing was 36 cm from the       incisors. The Z-line was 33 cm from the incisors.      Prior loose Nissen fundoplication was found at the gastroesophageal       junction.      The gastroesophageal flap valve was visualized endoscopically and       classified as Hill Grade IV (no fold, wide open lumen, hiatal hernia       present).      The entire examined stomach was normal.      The examined duodenum was normal. Impression:               - 3 cm hiatal hernia.                           - Nissen fundoplication was found.                           - Normal stomach.                           - Normal examined duodenum.                           - No specimens collected. Moderate Sedation:      Per Anesthesia Care Recommendation:           - Discharge patient to home (ambulatory).                           - Resume previous diet.                           - Continue present medications.                           -  Will refer for evaluation by [facility]                            Surgery for redo Nissen fundoplication and hiatal                            hernia repair. Procedure Code(s):        --- Professional ---                           [phone], Esophagogastroduodenoscopy, flexible,                            transoral; diagnostic, including collection of                            specimen(s) by brushing or washing, when performed                            (separate procedure) Diagnosis Code(s):        --- Professional ---                           K44.9, Diaphragmatic hernia without obstruction or                            gangrene                           Z98.890, Other specified  postprocedural states                           K21.9, Gastro-esophageal reflux disease without                            esophagitis                           R11.10, Vomiting, unspecified CPT copyright 2022 American Medical Association. All rights reserved. The codes documented in this report are preliminary and upon coder review may  be revised to meet current compliance requirements. Katrinka Blazing, MD Katrinka Blazing,  03/30/2023 1:23:10 PM This report has been signed electronically. Number of Addenda: 0

## 2023-03-31 ENCOUNTER — Telehealth: Payer: Self-pay | Admitting: *Deleted

## 2023-03-31 NOTE — Telephone Encounter (Signed)
-----   Message from [name] sent at 03/31/2023  8:51 AM EST ----- Should be general or foregut surgery. Thanks ----- Message ----- From: Simone Curia Sent: 03/31/2023   8:09 AM EST To: Dolores Frame, MD  Yes he is a colorectal surgeon, He's the one I told you guys about a few weeks ago. He is strictly colorectal. Does she need colorectal or general surgery? ----- Message ----- From: Dolores Frame, MD Sent: 03/30/2023   9:19 PM EST To: Simone Curia  He is colorectal surgery, so it may not work out ----- Message ----- From: Simone Curia Sent: 03/30/2023   4:09 PM EST To: Dolores Frame, MD  Do you want to send to Dr Earlie Counts with [facility], he has an office in [facility] ----- Message ----- From: Dolores Frame, MD Sent: 03/30/2023   1:47 PM EST To: Suzzanne Cloud, can you please refer the patient to [facility]? Dx: hiatal hernia, need for re do Nissen  Thanks,   Katrinka Blazing, MD Gastroenterology and Hepatology [facility]

## 2023-03-31 NOTE — Telephone Encounter (Signed)
Spoke to patient, she is agreeable to have referral sent to [facility] Referral sent, they will contact patient with apt

## 2023-03-31 NOTE — Telephone Encounter (Signed)
Thank you :)

## 2023-04-01 NOTE — Anesthesia Postprocedure Evaluation (Signed)
Anesthesia Post Note  Patient: MICAHLA CABO  Procedure(s) Performed: ESOPHAGOGASTRODUODENOSCOPY (EGD) WITH PROPOFOL  Patient location during evaluation: Phase II Anesthesia Type: General Level of consciousness: awake Pain management: pain level controlled Vital Signs Assessment: post-procedure vital signs reviewed and stable Respiratory status: spontaneous breathing and respiratory function stable Cardiovascular status: blood pressure returned to baseline and stable Postop Assessment: no headache and no apparent nausea or vomiting Anesthetic complications: no Comments: Late entry   No notable events documented.   Last Vitals:  Vitals:   03/30/23 1231 03/30/23 1321  BP: (!) 159/74 (!) 106/55  Pulse: 75 72  Resp: 15 16  Temp: 36.8 C (!) 36.3 C  SpO2: 95% 95%    Last Pain:  Vitals:   03/31/23 1418  TempSrc:   PainSc: 0-No pain                 Windell Norfolk

## 2023-04-02 ENCOUNTER — Telehealth (INDEPENDENT_AMBULATORY_CARE_PROVIDER_SITE_OTHER): Payer: Self-pay | Admitting: *Deleted

## 2023-04-02 ENCOUNTER — Telehealth (INDEPENDENT_AMBULATORY_CARE_PROVIDER_SITE_OTHER): Payer: Self-pay

## 2023-04-02 ENCOUNTER — Telehealth: Payer: Self-pay

## 2023-04-02 DIAGNOSIS — M816 Localized osteoporosis [Lequesne]: Secondary | ICD-10-CM

## 2023-04-02 MED ORDER — TYMLOS 3120 MCG/1.56ML ~~LOC~~ SOPN: 80.0000 ug | PEN_INJECTOR | Freq: Every day | SUBCUTANEOUS | 3 refills | Status: DC

## 2023-04-02 MED ORDER — INSULIN PEN NEEDLE 31G X 5 MM MISC: 2 refills | Status: DC

## 2023-04-02 NOTE — Telephone Encounter (Signed)
[name] [address] Hours of Operation: Address: 5 a.m. - 10 p.m. PT, Monday-Friday [address] [place], [place] [address] 6 a.m. - 3 p.m. PT, Saturday [place], [place] [address] Date: 03/31/2023 To: Dolores Frame From: [facility] Phone: [phone] Phone: [phone] Fax: [phone] Reference #: [id number]  RE: Prior Authorization Request __________________________________________________________________________________ Patient Name: Natalie Campos Patient DOB: [birth year] Patient ID: [id number] Status of Request: Deny Medication Name: Dexlansopraz Cap 60mg  Dr GPI/NDC: [id number] Decision Notes: DEXLANSOPRAZOLE 60mg  capsule (use as directed: 2 capsules per day) is denied for not meeting the quantity limit requirement. Quantities at or below your plan's limit 1 capsule per day will continue to process at the pharmacy for you. Coverage for the requested quantity requires the following: (1) The higher dose or quantity is supported in the dosage and administration section of the manufacturer's prescribing information; OR (2) the higher dose or quantity is supported by one of the following Medicare approved compendia: (a) [facility] Formulary Service Drug Information; OR (b) [employer] DRUGDEX Information System. Reviewed by: [place] Herb[employer]. If the treating physician would like to discuss this coverage decision with the physician or health care professional reviewer, please call [facility] Prior Authorization department at [phone].

## 2023-04-02 NOTE — Progress Notes (Unsigned)
Office Visit Note  Patient: Natalie Campos             Date of Birth: Jun 28, 1960           MRN: [id number]             PCP: Babs Sciara, MD Referring: Babs Sciara, MD Visit Date: 04/16/2023 Occupation: @GUAROCC @  Subjective:    History of Present Illness: ABBRIELLE Campos is a 62 y.o. female with history of osteoporosis and osteoarthritis.     DEXA 12/23/22: Radius 33% BMD 0.583 T-score -3.4. RFN -2.6, Left RFN -2.6. This is the patients first DEXA.  Patient is naive to osteoporosis treatment.  Frequent falls due to underlying MS-using walker to assist with ambulation.  No recent fractures. Previous forearm fractures during childhood. Family history of osteoporosis-paternal grandmother.  She is taking vitamin D 50,000 units once weekly and drinks milk daily.  History of IV and intraarticular steroid use for many years. Scheduled for epidural injection tomorrow.    History of GERD and Barrett's esophagitis--not a good candidate for oral bisphosphonates.  Discussed the option of IV Reclast but the patient would like to initiate a therapy with anabolic properties for increased efficacy.  Reviewed indications, contraindications, potential side effects of Evenity versus tymlos today in detail.  Tymlos started 04/01/2023    Activities of Daily Living:  Patient reports morning stiffness for *** {minute/hour:19697}.   Patient {ACTIONS;DENIES/REPORTS:21021675::"Denies"} nocturnal pain.  Difficulty dressing/grooming: {ACTIONS;DENIES/REPORTS:21021675::"Denies"} Difficulty climbing stairs: {ACTIONS;DENIES/REPORTS:21021675::"Denies"} Difficulty getting out of chair: {ACTIONS;DENIES/REPORTS:21021675::"Denies"} Difficulty using hands for taps, buttons, cutlery, and/or writing: {ACTIONS;DENIES/REPORTS:21021675::"Denies"}  No Rheumatology ROS completed.   PMFS History:  Patient Active Problem List   Diagnosis Date Noted   SOB (shortness of breath) 02/24/2023   MS (multiple sclerosis)  (HCC) 02/23/2023   Status post laparoscopic Nissen fundoplication 03/24/2021   Barrett's esophagus 01/17/2019   Hiatal hernia 08/12/2017   GERD (gastroesophageal reflux disease) 09/11/2015   Anemia 08/01/2013   Hyperlipidemia 05/23/2009   Persistent depressive disorder 05/23/2009   Essential hypertension 05/23/2009   EMPHYSEMA 05/23/2009   Irritable bowel syndrome 05/23/2009   DEGENERATIVE DISC DISEASE 05/23/2009    Past Medical History:  Diagnosis Date   Anemia    Angio-edema    Anxiety    Barrett's esophagus    Bulging discs    Cataract    Complication of anesthesia    Complication of anesthesia    patietn wakes up easily- has anxiety due to waking up during a simple procedure   Depression    DJD (degenerative joint disease)    Dog bite(E906.0) 12/06/2009   Eczema    GERD (gastroesophageal reflux disease)    High triglycerides    Hyperlipidemia    Hypertension    IBS (irritable bowel syndrome)    contipation predominant    Iron deficiency anemia    Multiple sclerosis (HCC) 05/11/1988   Dx in 1990 most recent hospitalization for a flareis in 2011    Obesity    Osteoporosis 02/2023    Family History  Problem Relation Age of Onset   COPD Mother    Alcoholism Mother    Mental illness Mother    Alcoholism Father    Eczema Father    Asthma Sister    Hypertension Sister    Asthma Brother    Asthma Maternal Uncle    Asthma Paternal Uncle    Allergic rhinitis Daughter    Ulcerative colitis Daughter    Asthma Daughter    Healthy  Daughter    Irritable bowel syndrome Daughter    Past Surgical History:  Procedure Laterality Date   bilateral cataract surgery      BIOPSY  01/28/2011   Procedure: BIOPSY;  Surgeon: Malissa Hippo, MD;  [facility]: [facility];  Service: Endoscopy;  Laterality: N/A;   BIOPSY N/A 08/25/2013   Procedure: BIOPSY;  Surgeon: Malissa Hippo, MD;  Location: [facility];  Service: Endoscopy;  Laterality: N/A;   BIOPSY  02/12/2021   Procedure:  BIOPSY;  Surgeon: Malissa Hippo, MD;  [facility];  Service: Endoscopy;;   CHOLECYSTECTOMY  05/11/2008   Dr. Gabriel Cirri    COLONOSCOPY WITH PROPOFOL N/A 08/25/2013   Procedure: COLONOSCOPY WITH PROPOFOL;  Surgeon: Malissa Hippo, MD;  Location: [facility];  Service: Endoscopy;  Laterality: N/A;  in cecum at 0811 out at 0822 = 11 minutes   ESOPHAGOGASTRODUODENOSCOPY  01/28/2011   Procedure: ESOPHAGOGASTRODUODENOSCOPY (EGD);  Surgeon: Malissa Hippo, MD;  [facility]: [facility];  Service: Endoscopy;  Laterality: N/A;  1:00   ESOPHAGOGASTRODUODENOSCOPY  03/24/2021   Procedure: ESOPHAGOGASTRODUODENOSCOPY (EGD);  Surgeon: Luretha Murphy, MD;  Location: [facility];  Service: General;;   ESOPHAGOGASTRODUODENOSCOPY (EGD) WITH PROPOFOL N/A 08/25/2013   Procedure: ESOPHAGOGASTRODUODENOSCOPY (EGD) WITH PROPOFOL;  Surgeon: Malissa Hippo, MD;  Location: [facility];  Service: Endoscopy;  Laterality: N/A;   ESOPHAGOGASTRODUODENOSCOPY (EGD) WITH PROPOFOL N/A 02/12/2021   Procedure: ESOPHAGOGASTRODUODENOSCOPY (EGD) WITH PROPOFOL;  Surgeon: Malissa Hippo, MD;  [facility];  Service: Endoscopy;  Laterality: N/A;  7:30   WISDOM TOOTH EXTRACTION     XI ROBOTIC ASSISTED HIATAL HERNIA REPAIR N/A 03/24/2021   Procedure: XI ROBOTIC ASSISTED HIATAL HERNIA REPAIR WITH FUNDOPLICATION;  Surgeon: Luretha Murphy, MD;  Location: [facility];  Service: General;  Laterality: N/A;   Social History   Social History Narrative   Two grown children, still birth infant    Immunization History  Administered Date(s) Administered   Influenza Whole 03/13/2011   Influenza, Seasonal, Injecte, Preservative Fre 02/03/2023   Influenza,inj,Quad PF,6+ Mos 01/24/2019, 03/04/2020, 01/19/2022   Influenza,inj,quad, With Preservative 03/15/2017, 02/28/2018   Influenza-Unspecified 03/13/2015, 02/12/2018, 01/14/2021   Moderna Sars-Covid-2 Vaccination 07/24/2019, 08/28/2019, 05/01/2020, 10/23/2020   Pneumococcal Conjugate-13  03/13/2015   Pneumococcal Polysaccharide-23 02/28/2018   Tdap 10/29/2010   Unspecified SARS-COV-2 Vaccination 02/11/2021   Zoster, Unspecified 02/04/2021, 04/10/2022     Objective: Vital Signs: There were no vitals taken for this visit.   Physical Exam Vitals and nursing note reviewed.  Constitutional:      Appearance: She is well-developed.  HENT:     Head: Normocephalic and atraumatic.  Eyes:     Conjunctiva/sclera: Conjunctivae normal.  Cardiovascular:     Rate and Rhythm: Normal rate and regular rhythm.     Heart sounds: Normal heart sounds.  Pulmonary:     Effort: Pulmonary effort is normal.     Breath sounds: Normal breath sounds.  Abdominal:     General: Bowel sounds are normal.     Palpations: Abdomen is soft.  Musculoskeletal:     Cervical back: Normal range of motion.  Lymphadenopathy:     Cervical: No cervical adenopathy.  Skin:    General: Skin is warm and dry.     Capillary Refill: Capillary refill takes less than 2 seconds.  Neurological:     Mental Status: She is alert and oriented to person, place, and time.  Psychiatric:        Behavior: Behavior normal.  Musculoskeletal Exam: ***  CDAI Exam: CDAI Score: -- Patient Global: --; Provider Global: -- Swollen: --; Tender: -- Joint Exam 04/16/2023   No joint exam has been documented for this visit   There is currently no information documented on the homunculus. Go to the Rheumatology activity and complete the homunculus joint exam.  Investigation: No additional findings.  Imaging: XR C-ARM NO REPORT  Result Date: 03/16/2023 Please see Notes tab for imaging impression.  Epidural Steroid injection  Result Date: 03/16/2023 Tyrell Antonio, MD     03/16/2023  2:14 PM S1 Lumbosacral Transforaminal Epidural Steroid Injection - Sub-Pedicular Approach with Fluoroscopic Guidance Patient: MCKENSIE FOUNDS     Date of Birth: 06-Apr-1961 MRN: [id number] PCP: Babs Sciara, MD     Visit Date: 03/16/2023   Universal Protocol:   Date/Time: 11/05/241:03 PM Consent Given By: the patient Position:  PRONE Additional Comments: Vital signs were monitored before and after the procedure. Patient was prepped and draped in the usual sterile fashion. The correct patient, procedure, and site was verified. Injection Procedure Details: Procedure Site One Meds Administered: Meds ordered this encounter Medications  methylPREDNISolone acetate (DEPO-MEDROL) injection 40 mg Laterality: Right Location/Site: S1 Foramen Needle size: 22 ga. Needle type: Spinal Needle Placement: Transforaminal Findings:  -Comments: Excellent flow of contrast along the nerve, nerve root and into the epidural space. Epidurogram: Contrast epidurogram showed no nerve root cut off or restricted flow pattern. Procedure Details: After squaring off the sacral end-plate to get a true AP view, the C-arm was positioned so that the best possible view of the S1 foramen was visualized. The soft tissues overlying this structure were infiltrated with 2-3 ml. of 1% Lidocaine without Epinephrine. The spinal needle was inserted toward the target using a "trajectory" view along the fluoroscope beam.  Under AP and lateral visualization, the needle was advanced so it did not puncture dura. Biplanar projections were used to confirm position. Aspiration was confirmed to be negative for CSF and/or blood. A 1-2 ml. volume of Isovue-250 was injected and flow of contrast was noted at each level. Radiographs were obtained for documentation purposes. After attaining the desired flow of contrast documented above, a 0.5 to 1.0 ml test dose of 0.25% Marcaine was injected into each respective transforaminal space.  The patient was observed for 90 seconds post injection.  After no sensory deficits were reported, and normal lower extremity motor function was noted,   the above injectate was administered so that equal amounts of the injectate were placed at each foramen (level) into the  transforaminal epidural space. Additional Comments: No complications occurred Dressing: Band-Aid with 2 x 2 sterile gauze  Post-procedure details: Patient was observed during the procedure. Post-procedure instructions were reviewed. Patient left the clinic in stable condition.    Recent Labs: Lab Results  Component Value Date   WBC 7.1 03/29/2023   HGB 14.3 03/29/2023   PLT 333 03/29/2023   NA 139 03/29/2023   K 3.6 03/29/2023   CL 102 03/29/2023   CO2 27 03/29/2023   GLUCOSE 119 (H) 03/29/2023   BUN 19 03/29/2023   CREATININE 0.69 03/29/2023   BILITOT <0.2 03/04/2020   ALKPHOS 114 03/04/2020   AST 17 03/04/2020   ALT 14 03/04/2020   PROT WILL FOLLOW 03/29/2023   ALBUMIN 4.8 03/04/2020   CALCIUM 9.4 03/29/2023   GFRAA 77 03/04/2020    Speciality Comments: No specialty comments available.  Procedures:  No procedures performed Allergies: Ace inhibitors, Atenolol, Losartan, Milk-related compounds, and Penicillins  Assessment / Plan:     Visit Diagnoses: Localized osteoporosis without current pathological fracture  Current chronic use of systemic steroids  Vitamin D deficiency  Medication monitoring encounter  Primary osteoarthritis of left knee  Degeneration of intervertebral disc of lumbosacral region without discogenic pain or lower extremity pain  Lumbar nerve root impingement  Chronic pain of left ankle  Essential hypertension  Hiatal hernia  Barrett's esophagus without dysplasia  History of gastroesophageal reflux (GERD)  History of IBS  MS (multiple sclerosis) (HCC)  Other hyperlipidemia  Status post laparoscopic Nissen fundoplication  Persistent depressive disorder  Orders: No orders of the defined types were placed in this encounter.  No orders of the defined types were placed in this encounter.   Face-to-face time spent with patient was *** minutes. Greater than 50% of time was spent in counseling and coordination of care.  Follow-Up  Instructions: No follow-ups on file.   Gearldine Bienenstock, PA-C  Note - This record has been created using Dragon software.  Chart creation errors have been sought, but may not always  have been located. Such creation errors do not reflect on  the standard of medical care.

## 2023-04-02 NOTE — Telephone Encounter (Signed)
Please let her know her insurance denied twice a day dosing.  She will need to continue with 60 mg every day.

## 2023-04-02 NOTE — Addendum Note (Signed)
Addended by: Murrell Redden on: 04/02/2023 01:30 PM   Modules accepted: Orders

## 2023-04-02 NOTE — Telephone Encounter (Signed)
Patient contacted the office and states she has gotten her Tymlos shots in and she plans to start them tonight 04/02/2023.

## 2023-04-02 NOTE — Telephone Encounter (Signed)
Noted - added Tymlos to med list today. Future refills can be escribed to [facility].  Chesley Mires, PharmD, MPH, BCPS, CPP Clinical Pharmacist (Rheumatology and Pulmonology)

## 2023-04-05 ENCOUNTER — Encounter (INDEPENDENT_AMBULATORY_CARE_PROVIDER_SITE_OTHER): Payer: Self-pay

## 2023-04-05 ENCOUNTER — Encounter (HOSPITAL_COMMUNITY): Payer: Self-pay | Admitting: Gastroenterology

## 2023-04-05 LAB — PROTEIN ELECTROPHORESIS, SERUM, WITH REFLEX
A/G Ratio: 1.3 (ref 0.7–1.7)
Albumin ELP: 4.1 g/dL (ref 2.9–4.4)
Alpha 1: 0.2 g/dL (ref 0.0–0.4)
Alpha 2: 0.6 g/dL (ref 0.4–1.0)
Beta: 1.1 g/dL (ref 0.7–1.3)
Gamma Globulin: 1.1 g/dL (ref 0.4–1.8)
Globulin, Total: 3.1 g/dL (ref 2.2–3.9)
Total Protein: 7.2 g/dL (ref 6.0–8.5)

## 2023-04-05 LAB — PARATHYROID HORMONE, INTACT (NO CA): PTH: 23 pg/mL (ref 15–65)

## 2023-04-05 LAB — VITAMIN D 25 HYDROXY (VIT D DEFICIENCY, FRACTURES): Vit D, 25-Hydroxy: 71.5 ng/mL (ref 30.0–100.0)

## 2023-04-05 LAB — TSH: TSH: 3.14 u[IU]/mL (ref 0.450–4.500)

## 2023-04-05 LAB — PHOSPHORUS: Phosphorus: 3.2 mg/dL (ref 3.0–4.3)

## 2023-04-05 NOTE — Progress Notes (Signed)
SPEP normal.  TSH WNL Vitamin D WNL  PTH WNL Phosphorus WNL

## 2023-04-05 NOTE — Telephone Encounter (Signed)
My Chart message sent stating: Hello Natalie Campos will not cover the two capsules per day. There is a quantity limit of once per day.    DEXLANSOPRAZOLE 60mg  capsule (use as directed: 2 capsules per day) is denied for not meeting the quantity limit requirement. Quantities at or below your plan's limit 1 capsule per day will continue to process at the pharmacy for you.

## 2023-04-05 NOTE — Telephone Encounter (Signed)
error 

## 2023-04-05 NOTE — Telephone Encounter (Signed)
Hello Mrs. [employer] will not cover the two capsules per day. There is a quantity limit of once per day.    DEXLANSOPRAZOLE 60mg  capsule (use as directed: 2 capsules per day) is denied for not meeting the quantity limit requirement. Quantities at or below your plan's limit 1 capsule per day will continue to process at the pharmacy for you.   Last read by Tonia Brooms at  9:09 AM on 04/05/2023.

## 2023-04-08 ENCOUNTER — Other Ambulatory Visit: Payer: Self-pay | Admitting: Family Medicine

## 2023-04-12 ENCOUNTER — Telehealth: Payer: Self-pay | Admitting: Family Medicine

## 2023-04-12 ENCOUNTER — Other Ambulatory Visit: Payer: Self-pay | Admitting: Neurology

## 2023-04-12 NOTE — Telephone Encounter (Signed)
Refill on  diazepam (VALIUM) 10 MG tablet  -[facility]

## 2023-04-12 NOTE — Telephone Encounter (Signed)
So previously  I dictated a letter regarding her value mental health this is not a medication that we will be able to prescribe long-term.  I connected with her neurologist who did not feel that this was a medication he would recommend or prescribe Currently state medical board does not want providers prescribing benzodiazepines to individuals who are on pain medications In other words the patient needs to choose what is more important I would recommend if she desires to stay with her pain medicine that we begin a taper of her Valium gradually to the point where we are no longer on Valium for safety purposes She has a follow-up visit with me in December she needs to keep this visit I am willing to prescribe Valium to cover until that visit Please talk with patient to make sure she understands all of this If she did not receive the letter we can share this letter with her when she comes for her visit Please make sure patient is aware, if she is on board with me sending in additional diazepam to tide her over until I see her and discuss this in further detail in on her visit December 11 that would be fine thank you We will be doing a reduced amount of Valium for safety purposes thank you

## 2023-04-13 NOTE — Telephone Encounter (Signed)
Tymlos started 04/01/2023 per patient's message.  Chesley Mires, PharmD, MPH, BCPS, CPP Clinical Pharmacist (Rheumatology and Pulmonology)

## 2023-04-13 NOTE — Telephone Encounter (Signed)
Last seen on 10/29/22 Follow up scheduled on 05/17/23 Next refill due at the end of Dec 2024

## 2023-04-14 ENCOUNTER — Other Ambulatory Visit: Payer: Self-pay | Admitting: Family Medicine

## 2023-04-14 MED ORDER — DIAZEPAM 10 MG PO TABS: ORAL_TABLET | ORAL | 0 refills | Status: DC

## 2023-04-14 NOTE — Telephone Encounter (Signed)
Prescription for 45 tablets keep follow-up in December Will be discussing with patient the most recent letter that I dictated

## 2023-04-14 NOTE — Telephone Encounter (Signed)
Patient advised of provider's recommendations and stated she takes the valium for her MS and she would like enouh sent in till her appt next week and then she will discuss it with you further next week

## 2023-04-15 ENCOUNTER — Other Ambulatory Visit: Payer: Self-pay | Admitting: Family Medicine

## 2023-04-16 ENCOUNTER — Ambulatory Visit: Payer: Medicare Other | Admitting: Physician Assistant

## 2023-04-16 DIAGNOSIS — M1712 Unilateral primary osteoarthritis, left knee: Secondary | ICD-10-CM

## 2023-04-16 DIAGNOSIS — I10 Essential (primary) hypertension: Secondary | ICD-10-CM

## 2023-04-16 DIAGNOSIS — Z9889 Other specified postprocedural states: Secondary | ICD-10-CM

## 2023-04-16 DIAGNOSIS — K449 Diaphragmatic hernia without obstruction or gangrene: Secondary | ICD-10-CM

## 2023-04-16 DIAGNOSIS — Z5181 Encounter for therapeutic drug level monitoring: Secondary | ICD-10-CM

## 2023-04-16 DIAGNOSIS — M816 Localized osteoporosis [Lequesne]: Secondary | ICD-10-CM

## 2023-04-16 DIAGNOSIS — G35 Multiple sclerosis: Secondary | ICD-10-CM

## 2023-04-16 DIAGNOSIS — K2270 Barrett's esophagus without dysplasia: Secondary | ICD-10-CM

## 2023-04-16 DIAGNOSIS — F341 Dysthymic disorder: Secondary | ICD-10-CM

## 2023-04-16 DIAGNOSIS — E7849 Other hyperlipidemia: Secondary | ICD-10-CM

## 2023-04-16 DIAGNOSIS — E559 Vitamin D deficiency, unspecified: Secondary | ICD-10-CM

## 2023-04-16 DIAGNOSIS — Z8719 Personal history of other diseases of the digestive system: Secondary | ICD-10-CM

## 2023-04-16 DIAGNOSIS — G8929 Other chronic pain: Secondary | ICD-10-CM

## 2023-04-16 DIAGNOSIS — M5416 Radiculopathy, lumbar region: Secondary | ICD-10-CM

## 2023-04-16 DIAGNOSIS — Z7952 Long term (current) use of systemic steroids: Secondary | ICD-10-CM

## 2023-04-16 DIAGNOSIS — M51379 Other intervertebral disc degeneration, lumbosacral region without mention of lumbar back pain or lower extremity pain: Secondary | ICD-10-CM

## 2023-04-17 ENCOUNTER — Other Ambulatory Visit: Payer: Self-pay | Admitting: Neurology

## 2023-04-19 ENCOUNTER — Other Ambulatory Visit: Payer: Self-pay | Admitting: Family Medicine

## 2023-04-19 NOTE — Telephone Encounter (Signed)
Last seen on 10/29/22 Follow up scheduled on 05/17/23

## 2023-04-20 ENCOUNTER — Encounter (INDEPENDENT_AMBULATORY_CARE_PROVIDER_SITE_OTHER): Payer: Self-pay | Admitting: Gastroenterology

## 2023-04-20 ENCOUNTER — Telehealth (INDEPENDENT_AMBULATORY_CARE_PROVIDER_SITE_OTHER): Payer: Self-pay | Admitting: *Deleted

## 2023-04-20 ENCOUNTER — Ambulatory Visit (INDEPENDENT_AMBULATORY_CARE_PROVIDER_SITE_OTHER): Payer: Medicare Other | Admitting: Gastroenterology

## 2023-04-20 VITALS — BP 151/89 | HR 90 | Temp 98.1°F | Ht 67.5 in | Wt 236.6 lb

## 2023-04-20 DIAGNOSIS — K219 Gastro-esophageal reflux disease without esophagitis: Secondary | ICD-10-CM

## 2023-04-20 DIAGNOSIS — R1310 Dysphagia, unspecified: Secondary | ICD-10-CM | POA: Diagnosis not present

## 2023-04-20 DIAGNOSIS — K5900 Constipation, unspecified: Secondary | ICD-10-CM | POA: Diagnosis not present

## 2023-04-20 DIAGNOSIS — Z8719 Personal history of other diseases of the digestive system: Secondary | ICD-10-CM

## 2023-04-20 MED ORDER — VOQUEZNA 10 MG PO TABS: 10.0000 mg | ORAL_TABLET | Freq: Every day | ORAL | 1 refills | Status: DC

## 2023-04-20 NOTE — Patient Instructions (Addendum)
Continue with miralax 1 capful daily We will add benefiber 1T daily with a meal, can increase to 1T 2-3 times per day if tolerating well Continue with good water intake Keep appt with surgeon in January We will try voquezna for your GERD, this may take some time to get approved with insurance, please continue with dexilant for now   Follow up 3 months  It was a pleasure to see you today. I want to create trusting relationships with patients and provide genuine, compassionate, and quality care. I truly value your feedback! please be on the lookout for a survey regarding your visit with me today. I appreciate your input about our visit and your time in completing this!    Candela Krul L. Jeanmarie Hubert, MSN, APRN, AGNP-C Adult-Gerontology Nurse Practitioner [facility] at [facility]

## 2023-04-20 NOTE — Progress Notes (Addendum)
Referring Provider: Babs Sciara, MD Primary Care Physician:  Babs Sciara, MD Primary GI Physician: Dr. Levon Hedger   Chief Complaint  Patient presents with   Gastroesophageal Reflux    Follow up on GERD.  Patient wanted to discuss if she has barrett's she states Dr. Karilyn Cota told her he did not thinks she had barrett's. Takes dexilant once daily instead of twice a day since insurance would not cover bid. Paying $100 for 30 day supply.    HPI:   Natalie Campos is a 62 y.o. female with past medical history of GERD complicated by short segment Barrett's esophagus, IBS-C, multiple sclerosis, IBS   Patient presenting today for follow up of GERD, Dysphagia and constipation    Pertinent history: longstanding history of GERD. seen by Dr. Karilyn Cota in the past for management of short segment Barrett's esophagus.  At some point she had abnormalities concerning for low-grade dysplasia but this was never seen again in repeat biopsies.  She also had a large hiatal hernia measuring up to 6 cm, for which she underwent hiatal hernia repair and Nissen fundoplication on 03/24/2021 with Dr. Luretha Murphy.  She had a repeat esophagram on 03/20/2022 that showed presence of recurrent small to moderate hiatal hernia with marked esophageal dysmotility possibly related to reflux related dysmotility.   Patient last seen in October 2024.  At that time taking MiraLAX once a day and 3 soft stool softeners daily.  Having a bowel movement once a week.  Reported recurrent issues with GERD.  Waking up at night with regurgitation and coughing.  Taking Pepto-Bismol and Rolaids during the night.  On Nexium 40 mg twice daily at that time.  Also reports some frequent choking episodes and drinking water or food.  Having some dysphagia.  Patient recommended to start MiraLAX 2 capfuls daily, continue stool softeners twice daily, stop esomeprazole and start Dexilant 60 mg daily, schedule EGD, consider esophageal manometry if  dysphagia persist  EGD as outlined below, referred to [facility] for Nissen fundoplication and HH repair  Present:  Still having some issues with dysphagia, can be with most anything. She notes that eating late and feeling more tired tends to worsen her symptoms. She notes that she is has acid regurgitation and heartburn almost daily on dexilant. She will wake up coughing. Could not afford BID dosing of dexilant and notes once daily dosing is $100 for 30 days. She is doing pepto bismol, notes drinking almost a bottle of this per week as well as tums/rolaids PRN as well to help with her symptoms.   She has an appt with [facility] in January for evaluation of Nissen/HH repair, insurance did not cover her to see [facility].   She is taking 2 stool softeners and miralax, noting she may do this for 2-3 days without any results then will skip a few weeks of it due to diarrhea then will resume taking it. She notes she can go multiple days without a BM and then going daily, starting with hard stools that transition to more thicker/looser stools. Denies rectal bleeding or melena. She reports she has done benefiber in the past but unsure how much she was taking. Reports water intake is good, around 40 oz per day. Denies abdominal pain but feels that abdomen is bloated at times.   Last Colonoscopy: 2015 Normal colonoscopy except external hemorrhoids and 2 smaller anal papillae.  Last Endoscopy:03/2023- 3 cm hiatal hernia.                           -  Nissen fundoplication was found.                           - Normal stomach.                           - Normal examined duodenum.                           - No specimens collected.  Recommendations:    Past Medical History:  Diagnosis Date   Anemia    Angio-edema    Anxiety    Barrett's esophagus    Bulging discs    Cataract    Complication of anesthesia    Complication of anesthesia    patietn wakes up easily- has anxiety due to waking up during a simple procedure    Depression    DJD (degenerative joint disease)    Dog bite(E906.0) 12/06/2009   Eczema    GERD (gastroesophageal reflux disease)    High triglycerides    Hyperlipidemia    Hypertension    IBS (irritable bowel syndrome)    contipation predominant    Iron deficiency anemia    Multiple sclerosis (HCC) 05/11/1988   Dx in 1990 most recent hospitalization for a flareis in 2011    Obesity    Osteoporosis 02/2023    Past Surgical History:  Procedure Laterality Date   bilateral cataract surgery      BIOPSY  01/28/2011   Procedure: BIOPSY;  Surgeon: Malissa Hippo, MD;  [facility];  Service: Endoscopy;  Laterality: N/A;   BIOPSY N/A 08/25/2013   Procedure: BIOPSY;  Surgeon: Malissa Hippo, MD;  Location: [facility];  Service: Endoscopy;  Laterality: N/A;   BIOPSY  02/12/2021   Procedure: BIOPSY;  Surgeon: Malissa Hippo, MD;  [facility];  Service: Endoscopy;;   CHOLECYSTECTOMY  05/11/2008   Dr. Gabriel Cirri    COLONOSCOPY WITH PROPOFOL N/A 08/25/2013   Procedure: COLONOSCOPY WITH PROPOFOL;  Surgeon: Malissa Hippo, MD;  Location: [facility];  Service: Endoscopy;  Laterality: N/A;  in cecum at 0811 out at 0822 = 11 minutes   ESOPHAGOGASTRODUODENOSCOPY  01/28/2011   Procedure: ESOPHAGOGASTRODUODENOSCOPY (EGD);  Surgeon: Malissa Hippo, MD;  [facility];  Service: Endoscopy;  Laterality: N/A;  1:00   ESOPHAGOGASTRODUODENOSCOPY  03/24/2021   Procedure: ESOPHAGOGASTRODUODENOSCOPY (EGD);  Surgeon: Luretha Murphy, MD;  Location: [facility];  Service: General;;   ESOPHAGOGASTRODUODENOSCOPY (EGD) WITH PROPOFOL N/A 08/25/2013   Procedure: ESOPHAGOGASTRODUODENOSCOPY (EGD) WITH PROPOFOL;  Surgeon: Malissa Hippo, MD;  Location: [facility];  Service: Endoscopy;  Laterality: N/A;   ESOPHAGOGASTRODUODENOSCOPY (EGD) WITH PROPOFOL N/A 02/12/2021   Procedure: ESOPHAGOGASTRODUODENOSCOPY (EGD) WITH PROPOFOL;  Surgeon: Malissa Hippo, MD;  [facility];  Service:  Endoscopy;  Laterality: N/A;  7:30   ESOPHAGOGASTRODUODENOSCOPY (EGD) WITH PROPOFOL N/A 03/30/2023   Procedure: ESOPHAGOGASTRODUODENOSCOPY (EGD) WITH PROPOFOL;  Surgeon: Dolores Frame, MD;  [facility];  Service: Gastroenterology;  Laterality: N/A;  1:15PM;ASA 3   WISDOM TOOTH EXTRACTION     XI ROBOTIC ASSISTED HIATAL HERNIA REPAIR N/A 03/24/2021   Procedure: XI ROBOTIC ASSISTED HIATAL HERNIA REPAIR WITH FUNDOPLICATION;  Surgeon: Luretha Murphy, MD;  Location: [facility];  Service: General;  Laterality: N/A;    Current Outpatient Medications  Medication Sig Dispense Refill   amLODipine (NORVASC) 5 MG tablet  TAKE 1 TABLET BY MOUTH DAILY 100 tablet 2   ARIPiprazole (ABILIFY) 5 MG tablet TAKE 1 TABLET BY MOUTH AT BEDTIME FOR DEPRESSION 90 tablet 0   baclofen (LIORESAL) 10 MG tablet Take 10 mg by mouth 2 (two) times daily.      clobetasol cream (TEMOVATE) 0.05 % Apply 1 Application topically as needed.     dexlansoprazole (DEXILANT) 60 MG capsule Take 1 capsule (60 mg total) by mouth 2 (two) times daily. 180 capsule 3   diazepam (VALIUM) 10 MG tablet 1 in the morning,1/2 midday, 1/2 late afternoon, 1/2 to 1 each evening   for MS muscle spasms 45 tablet 0   docusate sodium (COLACE) 100 MG capsule Take 100 mg by mouth 2 (two) times daily.     DULoxetine (CYMBALTA) 60 MG capsule Take 60 mg by mouth daily.      ferrous sulfate 325 (65 FE) MG EC tablet Take 325 mg by mouth once a week.     fish oil-omega-3 fatty acids 1000 MG capsule Take 1 g by mouth daily.     fluticasone (FLONASE) 50 MCG/ACT nasal spray Place 2 sprays into both nostrils daily. 16 g 5   gabapentin (NEURONTIN) 600 MG tablet Take 1 tablet (600 mg total) by mouth 3 (three) times daily. 90 tablet 5   hydrochlorothiazide (MICROZIDE) 12.5 MG capsule TAKE 1 CAPSULE BY MOUTH DAILY 90 capsule 3   HYDROmorphone (DILAUDID) 4 MG tablet Take 4 mg by mouth 3 (three) times daily.     HYDROmorphone HCl (EXALGO) 12 MG TB24 Take  12 mg by mouth daily.     Insulin Pen Needle 31G X 5 MM MISC Use to inject Tymlos once daily. DO NOT REUSE. Sent via fax to [facility]. Future refills to [facility] 100 each 2   modafinil (PROVIGIL) 200 MG tablet TAKE 1 TABLET BY MOUTH IN THE MORNING AND 1 TABLET AT NOON 60 tablet 0   multivitamin (THERAGRAN) per tablet Take 1 tablet by mouth daily.     naloxone (NARCAN) nasal spray 4 mg/0.1 mL Use as directed for opioid overdose 1 each 0   nystatin (MYCOSTATIN/NYSTOP) powder SMARTSIG:Packet(s) Topical 4 Times Daily     olopatadine (PATANOL) 0.1 % ophthalmic solution Place 1 drop into both eyes 2 (two) times daily. 5 mL 12   polyethylene glycol powder (GLYCOLAX/MIRALAX) 17 GM/SCOOP powder MIX 25.5 GRAMS WITH WATER OR JUICE ONCE DAILY. (Patient taking differently: Take 25.5 g by mouth daily. MIX 25.5 GRAMS WITH WATER OR JUICE ONCE DAILY.) 510 g 5   potassium chloride (KLOR-CON) 10 MEQ tablet TAKE 1 TABLET BY MOUTH TWICE  DAILY 200 tablet 2   tamsulosin (FLOMAX) 0.4 MG CAPS capsule Take 1 capsule by mouth once daily 30 capsule 1   Vitamin D, Ergocalciferol, (DRISDOL) 1.25 MG (50000 UNIT) CAPS capsule Take 50,000 Units by mouth every 7 (seven) days.     Abaloparatide (TYMLOS) 3120 MCG/1.56ML SOPN Inject 80 mcg into the skin at bedtime. Sent via fax to [facility]. Future refills to [facility] 1.56 mL 3   No current facility-administered medications for this visit.    Allergies as of 04/20/2023 - Review Complete 04/20/2023  Allergen Reaction Noted   Ace inhibitors Anaphylaxis 10/16/2011   Atenolol Anaphylaxis 11/02/2017   Losartan Swelling 02/10/2021   Penicillins      Family History  Problem Relation Age of Onset   COPD Mother    Alcoholism Mother    Mental illness Mother    Alcoholism Father  Eczema Father    Asthma Sister    Hypertension Sister    Asthma Brother    Asthma Maternal Uncle    Asthma Paternal Uncle    Allergic rhinitis Daughter    Ulcerative colitis  Daughter    Asthma Daughter    Healthy Daughter    Irritable bowel syndrome Daughter     Social History   Socioeconomic History   Marital status: Married    Spouse name: Not on file   Number of children: 3   Years of education: Not on file   Highest education level: GED or equivalent  Occupational History   Occupation: disabled since 2002  Tobacco Use   Smoking status: Former    Current packs/day: 0.00    Average packs/day: 1.5 packs/day for 30.0 years (45.0 ttl pk-yrs)    Types: Cigarettes    Start date: 03/08/1978    Quit date: 03/08/2008    Years since quitting: 15.1    Passive exposure: Past   Smokeless tobacco: Never  Vaping Use   Vaping status: Never Used  Substance and Sexual Activity   Alcohol use: No    Alcohol/week: 0.0 standard drinks of alcohol   Drug use: No   Sexual activity: Yes    Birth control/protection: Post-menopausal  Other Topics Concern   Not on file  Social History Narrative   Two grown children, still birth infant    Social Determinants of Health   Financial Resource Strain: Low Risk  (07/31/2022)   Overall Financial Resource Strain (CARDIA)    Difficulty of Paying Living Expenses: Not very hard  Food Insecurity: No Food Insecurity (11/03/2022)   [facility], [facility]   Hunger Vital Sign    Worried About Running Out of Food in the Last Year: Never true    Ran Out of Food in the Last Year: Never true  Transportation Needs: No Transportation Needs (11/03/2022)   Received from [facility], [facility]   [facility] - Transportation    Lack of Transportation (Medical): No    Lack of Transportation (Non-Medical): No  Physical Activity: Unknown (07/31/2022)   Exercise Vital Sign    Days of Exercise per Week: 1 day    Minutes of Exercise per Session: Patient declined  Stress: Stress Concern Present (07/31/2022)   [employer] of Occupational Health - Occupational Stress Questionnaire    Feeling of Stress : To  some extent  Social Connections: Moderately Integrated (07/31/2022)   Social Connection and Isolation Panel [NHANES]    Frequency of Communication with Friends and Family: Three times a week    Frequency of Social Gatherings with Friends and Family: Three times a week    Attends Religious Services: 1 to 4 times per year    Active Member of Clubs or Organizations: No    Attends Engineer, structural: Not on file    Marital Status: Married    Review of systems General: negative for malaise, night sweats, fever, chills, weight loss Neck: Negative for lumps, goiter, pain and significant neck swelling Resp: Negative for cough, wheezing, dyspnea at rest CV: Negative for chest pain, leg swelling, palpitations, orthopnea GI: denies melena, hematochezia, nausea, vomiting, diarrhea, odyonophagia, early satiety or unintentional weight loss. +dysphagia +constipation  MSK: Negative for joint pain or swelling, back pain, and muscle pain. Derm: Negative for itching or rash Psych: Denies depression, anxiety, memory loss, confusion. No homicidal or suicidal ideation.  Heme: Negative for prolonged bleeding, bruising easily,  and swollen nodes. Endocrine: Negative for cold or heat intolerance, polyuria, polydipsia and goiter. Neuro: negative for tremor, gait imbalance, syncope and seizures. The remainder of the review of systems is noncontributory.  Physical Exam: BP (!) 151/89   Pulse 90   Temp 98.1 F (36.7 C) (Oral)   Ht 5' 7.5" (1.715 m)   Wt 236 lb 9.6 oz (107.3 kg)   BMI 36.51 kg/m  General:   Alert and oriented. No distress noted. Pleasant and cooperative.  Head:  Normocephalic and atraumatic. Eyes:  Conjuctiva clear without scleral icterus. Mouth:  Oral mucosa pink and moist. Good dentition. No lesions. Heart: Normal rate and rhythm, s1 and s2 heart sounds present.  Lungs: Clear lung sounds in all lobes. Respirations equal and unlabored. Abdomen:  +BS, soft, non-tender and  non-distended. No rebound or guarding. No HSM or masses noted. Derm: No palmar erythema or jaundice Msk:  Symmetrical without gross deformities. Normal posture. Extremities:  Without edema. Neurologic:  Alert and  oriented x4 Psych:  Alert and cooperative. Normal mood and affect.  Invalid input(s): "6 MONTHS"   ASSESSMENT: Natalie Campos is a 62 y.o. female presenting today for GERD, dysphagia and constipation  GERD/Dysphagia:Dysphagia is ongoing can be with anything she eats or drinks. Likely influenced some by her history of MS.  Having acid regurgitation and heartburn most days on Dexilant.  Could not afford twice daily dosing of Dexilant due to cost.  Doing Pepto-Bismol and Tums/Rolaids often.  She has tried and failed most other PPIs at this time.  She has appointment at [facility] in January for evaluation of hiatal hernia repair/Nissen fundoplication which encouraged her to keep.  For now we will stop Dexilant and start because in 2 mg daily to see if this improves her GERD symptoms.  History of SSBE on previous EGD though last biopsy in 2022 did not show evidence of BE which I discussed with the patient.   Constipation: Chronic history of constipation. She can go multiple days without a bowel movement.  Noting she will take a bowel regiment with stool softeners and MiraLAX about 2 to 3 days which will induce diarrhea she will then skip a few weeks of bowel regimen and become constipated again.  Water intake is good.  Recommend she start Benefiber 1 tablespoon daily with a meal, can increase to 1 tablespoon 2-3 times per day after 1 week if tolerating well.  Can continue with 1 capful of MiraLAX daily.  Continue with good water intake, aim for atleast 64 oz per day. Increase fruits, veggies and whole grains, kiwi and prunes are especially good for constipation   PLAN:  -Keep appt with surgeon  - Voquezna 10mg  daily, stop Dexilant -add benefiber 1T daily with a meal, can increase to 1T 2-3  times per day if tolerating well after 1 week -  miralax 1 capful daily -Increase water intake, aim for atleast 64 oz per day -Increase fruits, veggies and whole grains, kiwi and prunes are especially good for constipation  All questions were answered, patient verbalized understanding and is in agreement with plan as outlined above.    Follow Up: 3 months   Jens Siems L. Jeanmarie Hubert, MSN, APRN, AGNP-C Adult-Gerontology Nurse Practitioner [facility]  I have reviewed the note and agree with the APP's assessment as described in this progress note  Katrinka Blazing, MD Gastroenterology and Hepatology [facility]

## 2023-04-20 NOTE — Telephone Encounter (Signed)
Pt called about cost of voquezna. This just sent in today and I told her at visit today it would take a few days to submit info to insurance to see if we could get med approved. I haven't yet received PA request from pharmacy.

## 2023-04-21 ENCOUNTER — Encounter: Payer: Self-pay | Admitting: Family Medicine

## 2023-04-21 ENCOUNTER — Ambulatory Visit (INDEPENDENT_AMBULATORY_CARE_PROVIDER_SITE_OTHER): Payer: Medicare Other | Admitting: Family Medicine

## 2023-04-21 ENCOUNTER — Other Ambulatory Visit (INDEPENDENT_AMBULATORY_CARE_PROVIDER_SITE_OTHER): Payer: Self-pay | Admitting: *Deleted

## 2023-04-21 VITALS — BP 144/76 | HR 82 | Temp 98.2°F | Ht 67.5 in | Wt 238.0 lb

## 2023-04-21 DIAGNOSIS — M62830 Muscle spasm of back: Secondary | ICD-10-CM | POA: Diagnosis not present

## 2023-04-21 DIAGNOSIS — G35 Multiple sclerosis: Secondary | ICD-10-CM

## 2023-04-21 DIAGNOSIS — G894 Chronic pain syndrome: Secondary | ICD-10-CM

## 2023-04-21 MED ORDER — DIAZEPAM 5 MG PO TABS: ORAL_TABLET | ORAL | 0 refills | Status: DC

## 2023-04-21 MED ORDER — VOQUEZNA 10 MG PO TABS: 10.0000 mg | ORAL_TABLET | Freq: Every day | ORAL | 1 refills | Status: DC

## 2023-04-21 NOTE — Telephone Encounter (Signed)
Patient called and wanted rx to go through [facility] instead of [facility]. I told her I would send to [facility] and call [facility] to cancel.

## 2023-04-21 NOTE — Telephone Encounter (Signed)
Med sent to [facility] and called [facility] and canceled rx.

## 2023-04-21 NOTE — Progress Notes (Signed)
Subjective:    Patient ID: Natalie Campos, female    DOB: [birth year], 62 y.o.   MRN: [id number]   History of Present Illness   The patient, with a known history of multiple sclerosis (MS) and osteoporosis, reports a gradual decline in overall health and physical function. She has been experiencing increased pain and weakness, which she attributes to the progressive nature of her MS. She also reports a recent diagnosis of osteoporosis, for which she is self-administering daily Timox injections for a 67-month course.  The patient has been receiving gel shots for joint degenerative disc disease and osteoarthritis in her knees. She also reports a recent endoscopy and a diagnosis of a hiatal hernia, for which she is scheduled to undergo surgery. She has been experiencing difficulty sleeping due to the hernia and has been referred to a specialist for further management.  The patient has also experienced multiple falls, with one recent fall resulting in a head injury. She was evaluated at the hospital for potential fractures due to her osteoporosis but no fractures were identified. She reports chronic back pain, which she believes is exacerbated by her osteoporosis and long-term steroid use.  She reports some relief from these medications, but also expresses concern about potential side effects.  The patient is actively seeking alternatives to manage her symptoms. She reports a history of dystonia and is concerned about the potential for worsening spasms if she discontinues diazepam. She is also on gabapentin and baclofen, which she takes less frequently than prescribed due to concerns about side effects.  In summary, the patient is dealing with multiple chronic conditions, including MS, osteoporosis, joint degenerative disc disease, psoriasis, and a hiatal hernia. She is struggling with pain management and muscle spasms, and is concerned about the potential side effects and risks associated with her  current medication regimen. She is actively seeking alternatives and is open to changing her care team to better manage her symptoms.         Review of Systems     Objective:    Physical Exam     General-in no acute distress Eyes-no discharge Lungs-respiratory rate normal, CTA CV-no murmurs,RRR Extremities skin warm dry no edema Neuro grossly normal Behavior normal, alert           Assessment & Plan:  Assessment and Plan    Multiple Sclerosis (MS) Stable with no new demyelinating lesions on recent MRI. Patient reports gradual decline in function and increased pain.  -Continue current treatment regimen.   Osteoporosis On Timox injections daily for 24 months. Patient has a history of long-term steroid use contributing to bone thinning. -Continue current treatment regimen.  Chronic Pain Patient reports constant back pain, currently managed with hydromorphone. Recent steroid injection provided some relief. -Continue current pain management regimen.  Benzodiazepine Use Patient currently on diazepam for muscle spasms related to MS. Discussed the risks of concurrent use of benzodiazepines and opioids. -Gradually taper diazepam over the coming weeks, starting with a switch to 5mg  four times daily. -Consider alternative muscle relaxants.  Hiatal Hernia Patient has a history of hiatal hernia with recent slippage. Surgery planned in January. -Continue current management and proceed with planned surgery.  Falls Patient reports recent falls, likely related to MS and osteoporosis. -Encourage continued home exercises for stretching and muscle activation. -Consider referral for physical therapy for fall prevention.  Follow-up Plan for a video visit in six weeks to assess progress with diazepam taper and overall health status.  1. Multiple sclerosis (HCC) She is being followed by neurology She states she has a lot of trouble in the past with dystonia muscle spasms and  it has necessitated her to take muscle relaxers as well as diazepam She is on a significant amount of opioid medication I expressed to her how from a safety perspective being on diazepam and substantial opioids is not a good combination to increase her risk of accidental overdose she voices understanding but she also voices a strong concern and worry that she will have problems with dystonia if she stops diazepam Given that she has been on significant dose of diazepam for a long span of time I would recommend gradual tapering she agrees to this although reluctantly and understandably so She will be seeing neurology in the near future she will discuss with them alternatives to her current regimen  2. Spasm of back muscles Massage, muscle relaxers diazepam for now but we are tapering  3. Chronic pain syndrome She is being seen by [facility]  As for the diazepam I have encouraged her to go ahead and reduce it to 5 mg 4 times daily Her next step is to gradually start bringing it down to half a tablet for every 2 weeks and gradually over time taper off of this medicine  Her husband raises the possibility of trying different medications we will look into these  I would recommend a virtual visit in 6 weeks and then a standard office visit somewhere in 3 to 4 months

## 2023-04-22 NOTE — Telephone Encounter (Signed)
Pa submitted through covered my meds

## 2023-04-22 NOTE — Telephone Encounter (Signed)
Called [facility] to check status of order since have not received key to do pa and was told they could get med and they would send fax with a key. Await fax.

## 2023-04-23 ENCOUNTER — Other Ambulatory Visit: Payer: Self-pay | Admitting: Family Medicine

## 2023-04-23 DIAGNOSIS — E876 Hypokalemia: Secondary | ICD-10-CM

## 2023-04-23 NOTE — Telephone Encounter (Signed)
Med denied due to her not trying and failing lansoprazole and rabeprazole. See denial letter on your desk.

## 2023-04-26 ENCOUNTER — Other Ambulatory Visit: Payer: Self-pay | Admitting: Neurology

## 2023-04-26 MED ORDER — MODAFINIL 200 MG PO TABS: ORAL_TABLET | ORAL | 4 refills | Status: DC

## 2023-04-27 ENCOUNTER — Other Ambulatory Visit (INDEPENDENT_AMBULATORY_CARE_PROVIDER_SITE_OTHER): Payer: Self-pay | Admitting: Gastroenterology

## 2023-04-27 ENCOUNTER — Other Ambulatory Visit: Payer: Self-pay

## 2023-04-27 MED ORDER — RABEPRAZOLE SODIUM 20 MG PO TBEC: 20.0000 mg | DELAYED_RELEASE_TABLET | Freq: Two times a day (BID) | ORAL | 2 refills | Status: DC

## 2023-04-27 NOTE — Progress Notes (Signed)
a 

## 2023-04-27 NOTE — Telephone Encounter (Signed)
Patient notified

## 2023-04-29 ENCOUNTER — Encounter: Payer: Self-pay | Admitting: Family Medicine

## 2023-04-30 ENCOUNTER — Other Ambulatory Visit: Payer: Self-pay | Admitting: Nurse Practitioner

## 2023-04-30 ENCOUNTER — Encounter: Payer: Self-pay | Admitting: Family Medicine

## 2023-04-30 MED ORDER — TRIAMCINOLONE ACETONIDE 0.1 % EX CREA: 1.0000 | TOPICAL_CREAM | Freq: Two times a day (BID) | CUTANEOUS | 0 refills | Status: DC

## 2023-05-09 ENCOUNTER — Other Ambulatory Visit: Payer: Self-pay | Admitting: Family Medicine

## 2023-05-10 ENCOUNTER — Encounter: Payer: Self-pay | Admitting: Family Medicine

## 2023-05-10 ENCOUNTER — Other Ambulatory Visit: Payer: Self-pay | Admitting: Family Medicine

## 2023-05-11 NOTE — Progress Notes (Signed)
 Office Visit Note  Patient: Natalie Campos             Date of Birth: 1961/03/11           MRN: [id number]             PCP: Alphonsa Glendia LABOR, MD Referring: Alphonsa Glendia LABOR, MD Visit Date: 05/25/2023 Occupation: @GUAROCC @  Subjective:  Medication monitoring   History of Present Illness: Natalie Campos is a 62 y.o. female with history of osteoporosis and osteoarthritis.  Patient initiated daily tylmos injections on 04/02/23.  She has been tolerating tymlos  without any side effects or injection site reactions.  She denies any recent falls.  She continues to use a rollator walker to assist with ambulation. She has chronic pain in the left knee but does not plan to proceed with surgical intervention given history of MS. She has tried cortisone injections and visco gel injections in the left knee with no relief.     Activities of Daily Living:  Patient reports morning stiffness for 30 minutes.   Patient Denies nocturnal pain.  Difficulty dressing/grooming: Denies Difficulty climbing stairs: Reports Difficulty getting out of chair: Reports Difficulty using hands for taps, buttons, cutlery, and/or writing: Reports  Review of Systems  Constitutional:  Positive for fatigue.  HENT:  Positive for mouth dryness. Negative for mouth sores.   Eyes:  Positive for dryness.  Respiratory:  Negative for shortness of breath.   Cardiovascular:  Negative for chest pain and palpitations.  Gastrointestinal:  Negative for blood in stool, constipation and diarrhea.  Endocrine: Negative for increased urination.  Genitourinary:  Positive for involuntary urination.  Musculoskeletal:  Positive for joint pain, gait problem, joint pain, joint swelling, myalgias, muscle weakness, morning stiffness, muscle tenderness and myalgias.  Skin:  Positive for sensitivity to sunlight. Negative for color change, rash and hair loss.  Allergic/Immunologic: Negative for susceptible to infections.  Neurological:  Positive for  headaches. Negative for dizziness.  Hematological:  Negative for swollen glands.  Psychiatric/Behavioral:  Positive for depressed mood. Negative for sleep disturbance. The patient is not nervous/anxious.     PMFS History:  Patient Active Problem List   Diagnosis Date Noted   SOB (shortness of breath) 02/24/2023   MS (multiple sclerosis) (HCC) 02/23/2023   Constipation 10/21/2021   Status post laparoscopic Nissen fundoplication 03/24/2021   Barrett's esophagus 01/17/2019   Hiatal hernia 08/12/2017   GERD (gastroesophageal reflux disease) 09/11/2015   Anemia 08/01/2013   Hyperlipidemia 05/23/2009   Persistent depressive disorder 05/23/2009   Essential hypertension 05/23/2009   EMPHYSEMA 05/23/2009   Irritable bowel syndrome 05/23/2009   DEGENERATIVE DISC DISEASE 05/23/2009    Past Medical History:  Diagnosis Date   Anemia    Angio-edema    Anxiety    Barrett's esophagus    Bulging discs    Cataract    Complication of anesthesia    Complication of anesthesia    patietn wakes up easily- has anxiety due to waking up during a simple procedure   Depression    DJD (degenerative joint disease)    Dog bite(E906.0) 12/06/2009   Eczema    GERD (gastroesophageal reflux disease)    High triglycerides    Hyperlipidemia    Hypertension    IBS (irritable bowel syndrome)    contipation predominant    Iron deficiency anemia    Multiple sclerosis (HCC) 05/11/1988   Dx in 1990 most recent hospitalization for a flareis in 2011    Obesity  Osteoporosis 02/2023    Family History  Problem Relation Age of Onset   COPD Mother    Alcoholism Mother    Mental illness Mother    Alcoholism Father    Eczema Father    Asthma Sister    Hypertension Sister    Asthma Brother    Asthma Maternal Uncle    Asthma Paternal Uncle    Allergic rhinitis Daughter    Ulcerative colitis Daughter    Asthma Daughter    Healthy Daughter    Irritable bowel syndrome Daughter    Past Surgical History:   Procedure Laterality Date   bilateral cataract surgery      BIOPSY  01/28/2011   Procedure: BIOPSY;  Surgeon: Claudis RAYMOND Rivet, MD;  [facility];  Service: Endoscopy;  Laterality: N/A;   BIOPSY N/A 08/25/2013   Procedure: BIOPSY;  Surgeon: Claudis RAYMOND Rivet, MD;  Location: [facility];  Service: Endoscopy;  Laterality: N/A;   BIOPSY  02/12/2021   Procedure: BIOPSY;  Surgeon: Rivet Claudis RAYMOND, MD;  [facility];  Service: Endoscopy;;   CHOLECYSTECTOMY  05/11/2008   Dr. Ivery    COLONOSCOPY WITH PROPOFOL  N/A 08/25/2013   Procedure: COLONOSCOPY WITH PROPOFOL ;  Surgeon: Claudis RAYMOND Rivet, MD;  Location: [facility];  Service: Endoscopy;  Laterality: N/A;  in cecum at 0811 out at 0822 = 11 minutes   ESOPHAGOGASTRODUODENOSCOPY  01/28/2011   Procedure: ESOPHAGOGASTRODUODENOSCOPY (EGD);  Surgeon: Claudis RAYMOND Rivet, MD;  [facility];  Service: Endoscopy;  Laterality: N/A;  1:00   ESOPHAGOGASTRODUODENOSCOPY  03/24/2021   Procedure: ESOPHAGOGASTRODUODENOSCOPY (EGD);  Surgeon: Gladis Cough, MD;  Location: [facility];  Service: General;;   ESOPHAGOGASTRODUODENOSCOPY (EGD) WITH PROPOFOL  N/A 08/25/2013   Procedure: ESOPHAGOGASTRODUODENOSCOPY (EGD) WITH PROPOFOL ;  Surgeon: Claudis RAYMOND Rivet, MD;  Location: [facility];  Service: Endoscopy;  Laterality: N/A;   ESOPHAGOGASTRODUODENOSCOPY (EGD) WITH PROPOFOL  N/A 02/12/2021   Procedure: ESOPHAGOGASTRODUODENOSCOPY (EGD) WITH PROPOFOL ;  Surgeon: Rivet Claudis RAYMOND, MD;  [facility];  Service: Endoscopy;  Laterality: N/A;  7:30   ESOPHAGOGASTRODUODENOSCOPY (EGD) WITH PROPOFOL  N/A 03/30/2023   Procedure: ESOPHAGOGASTRODUODENOSCOPY (EGD) WITH PROPOFOL ;  Surgeon: Eartha Angelia Sieving, MD;  [facility];  Service: Gastroenterology;  Laterality: N/A;  1:15PM;ASA 3   WISDOM TOOTH EXTRACTION     XI ROBOTIC ASSISTED HIATAL HERNIA REPAIR N/A 03/24/2021   Procedure: XI ROBOTIC ASSISTED HIATAL HERNIA REPAIR WITH FUNDOPLICATION;   Surgeon: Gladis Cough, MD;  Location: [facility];  Service: General;  Laterality: N/A;   Social History   Social History Narrative   Two grown children, still birth infant    Immunization History  Administered Date(s) Administered   Influenza Whole 03/13/2011   Influenza, Seasonal, Injecte, Preservative Fre 02/03/2023   Influenza,inj,Quad PF,6+ Mos 01/24/2019, 03/04/2020, 01/19/2022   Influenza,inj,quad, With Preservative 03/15/2017, 02/28/2018   Influenza-Unspecified 03/13/2015, 02/12/2018, 01/14/2021   Moderna Covid-19 Fall Seasonal Vaccine 79yrs & older 02/18/2022   Moderna Sars-Covid-2 Vaccination 07/24/2019, 08/28/2019, 05/01/2020, 10/23/2020   Pneumococcal Conjugate-13 03/13/2015   Pneumococcal Polysaccharide-23 02/28/2018   Tdap 10/29/2010   Unspecified SARS-COV-2 Vaccination 02/11/2021   Zoster, Unspecified 02/04/2021, 04/10/2022     Objective: Vital Signs: BP 138/84 (BP Location: Right Arm, Patient Position: Sitting, Cuff Size: Large)   Pulse 82   Resp 16   Ht 5' 9 (1.753 m)   Wt 241 lb (109.3 kg)   BMI 35.59 kg/m    Physical Exam Vitals and nursing note reviewed.  Constitutional:  Appearance: She is well-developed.  HENT:     Head: Normocephalic and atraumatic.  Eyes:     Conjunctiva/sclera: Conjunctivae normal.  Cardiovascular:     Rate and Rhythm: Normal rate and regular rhythm.     Heart sounds: Normal heart sounds.  Pulmonary:     Effort: Pulmonary effort is normal.     Breath sounds: Normal breath sounds.  Abdominal:     General: Bowel sounds are normal.     Palpations: Abdomen is soft.  Musculoskeletal:     Cervical back: Normal range of motion.  Lymphadenopathy:     Cervical: No cervical adenopathy.  Skin:    General: Skin is warm and dry.     Capillary Refill: Capillary refill takes less than 2 seconds.  Neurological:     Mental Status: She is alert and oriented to person, place, and time.  Psychiatric:        Behavior: Behavior normal.       Musculoskeletal Exam: Patient remained seated on the examination table today.  C-spine has good range of motion.  Shoulder joints, elbow joints, wrist joints, MCPs, PIPs, DIPs have good range of motion with no synovitis.  Complete fist formation bilaterally.  Right knee joint has good range of motion with no discomfort.  Left knee joint has some fullness and discomfort with range of motion.  Ankle joints have good range of motion with no tenderness or joint swelling.  CDAI Exam: CDAI Score: -- Patient Global: --; Provider Global: -- Swollen: --; Tender: -- Joint Exam 05/25/2023   No joint exam has been documented for this visit   There is currently no information documented on the homunculus. Go to the Rheumatology activity and complete the homunculus joint exam.  Investigation: No additional findings.  Imaging: No results found.  Recent Labs: Lab Results  Component Value Date   WBC 7.1 03/29/2023   HGB 14.3 03/29/2023   PLT 333 03/29/2023   NA 139 03/29/2023   K 3.6 03/29/2023   CL 102 03/29/2023   CO2 27 03/29/2023   GLUCOSE 119 (H) 03/29/2023   BUN 19 03/29/2023   CREATININE 0.69 03/29/2023   BILITOT <0.2 03/04/2020   ALKPHOS 114 03/04/2020   AST 17 03/04/2020   ALT 14 03/04/2020   PROT 7.2 03/29/2023   ALBUMIN 4.8 03/04/2020   CALCIUM 9.4 03/29/2023   GFRAA 77 03/04/2020    Speciality Comments: No specialty comments available.  Procedures:  No procedures performed Allergies: Ace inhibitors, Atenolol, Losartan , and Penicillins   Assessment / Plan:     Visit Diagnoses: Localized osteoporosis without current pathological fracture:  DEXA 12/23/22: Radius 33% BMD 0.583 T-score -3.4. RFN -2.6, Left RFN -2.6. This is the patients first DEXA.  Patient was naive to osteoporosis treatment upon presenting for the initial office visit on 03/15/23. History of frequent falls due to underlying MS-using rollator walker to assist with ambulation.  No recent fractures.  Previous forearm fractures during childhood. Family history of osteoporosis-paternal grandmother.  She is taking vitamin D  50,000 units once weekly and drinks milk daily.  History of IV and intraarticular steroid use for many years.  History of GERD and Barrett's esophagitis--not a good candidate for oral bisphosphonates.  Different treatment options were discussed in detail at her initial office visit. Patient was started on Tymlos  daily sq injections on 04/02/23.  She has not noticed any side effects or injection site reactions.  She will follow up in 6 months or sooner if needed.   Current  chronic use of systemic steroids: History of chronic steriod use due to MS. Injections in knees and back in the past.   Medication monitoring encounter: Tymlos  daily injections-started on 04/02/2023.  No side effects.  Patient will be having updated lab work in March 2025 ordered by her neurologist.  Vitamin D  deficiency: Vitamin D  was 71.5 on 03/29/2023.  Patient is taking vitamin D  50,000 units once weekly.  Primary osteoarthritis of left knee: Chronic pain.  Patient has tried both viscosupplementation as well as cortisone injections in the past with minimal to no improvement.  According to the patient she was told that she is not a good surgical candidate due to history of MS.  She has been using a rollator walker as well as a brace for support.  Degeneration of intervertebral disc of lumbosacral region without discogenic pain or lower extremity pain: Chronic pain.  Recent epidural injection.  Using a rollator walker to assist with ambulation.  Lumbar nerve root impingement: Chronic pain.  Chronic pain of left ankle: No inflammation noted.  Other medical conditions are listed as follows:   Essential hypertension: BP was 138/84 today in the office.   Hiatal hernia  Barrett's esophagus without dysplasia  History of gastroesophageal reflux (GERD)  History of IBS  MS (multiple sclerosis) (HCC):  Diagnosed at the age of 18.  Under the care of neurology.  Relapsing and remitting.  She has been on several different medications in the past.  She has also received IV steroids previously.   Other hyperlipidemia  Status post laparoscopic Nissen fundoplication  Persistent depressive disorder  Orders: No orders of the defined types were placed in this encounter.  No orders of the defined types were placed in this encounter.    Follow-Up Instructions: Return in about 6 months (around 11/22/2023) for Osteoporosis.   Waddell CHRISTELLA Craze, PA-C  Note - This record has been created using Dragon software.  Chart creation errors have been sought, but may not always  have been located. Such creation errors do not reflect on  the standard of medical care.

## 2023-05-17 ENCOUNTER — Other Ambulatory Visit: Payer: Self-pay | Admitting: Neurology

## 2023-05-17 ENCOUNTER — Encounter: Payer: Self-pay | Admitting: Neurology

## 2023-05-17 ENCOUNTER — Telehealth: Payer: Self-pay | Admitting: Neurology

## 2023-05-17 ENCOUNTER — Ambulatory Visit: Payer: Medicare Other | Admitting: Neurology

## 2023-05-17 MED ORDER — BACLOFEN 10 MG PO TABS: 10.0000 mg | ORAL_TABLET | Freq: Two times a day (BID) | ORAL | 3 refills | Status: DC

## 2023-05-17 NOTE — Telephone Encounter (Signed)
 Pt states she called and LVM to cancel todays appt due to the weather. Next opening isn't until July and would like to be seen sooner because she would like to discuss Dr. Epimenio Foot writing a RX for baclofen (LIORESAL) 10 MG tablet.  Requesting call back

## 2023-05-17 NOTE — Telephone Encounter (Signed)
 Last seen on 10/29/22 " For spasticity she takes baclofen 10 mg po bid" Follow up scheduled on 07/14/23  I don't see that you prescribed Rx before, but is noted in chart. Was last prescribed by Beryle Beams, MD   Rx pending to be signed

## 2023-05-17 NOTE — Telephone Encounter (Signed)
 Pulled voicemail from patient - was able to get her rescheduled to a date in march. Asked about her baclofen  (LIORESAL ) 10 MG tablet . States she only has a weeks worth of medication left and is asking if we can send a refill in for her.  [facility] - [place], [place] - [address]   Is this something you are comfortable refilling for her?

## 2023-05-21 ENCOUNTER — Other Ambulatory Visit: Payer: Self-pay | Admitting: Family Medicine

## 2023-05-21 MED ORDER — DIAZEPAM 5 MG PO TABS: ORAL_TABLET | ORAL | 0 refills | Status: DC

## 2023-05-25 ENCOUNTER — Encounter: Payer: Self-pay | Admitting: Physician Assistant

## 2023-05-25 ENCOUNTER — Ambulatory Visit: Payer: Medicare Other | Attending: Physician Assistant | Admitting: Physician Assistant

## 2023-05-25 VITALS — BP 138/84 | HR 82 | Resp 16 | Ht 69.0 in | Wt 241.0 lb

## 2023-05-25 DIAGNOSIS — Z9889 Other specified postprocedural states: Secondary | ICD-10-CM

## 2023-05-25 DIAGNOSIS — M5416 Radiculopathy, lumbar region: Secondary | ICD-10-CM

## 2023-05-25 DIAGNOSIS — M1712 Unilateral primary osteoarthritis, left knee: Secondary | ICD-10-CM

## 2023-05-25 DIAGNOSIS — M25572 Pain in left ankle and joints of left foot: Secondary | ICD-10-CM

## 2023-05-25 DIAGNOSIS — Z5181 Encounter for therapeutic drug level monitoring: Secondary | ICD-10-CM

## 2023-05-25 DIAGNOSIS — K449 Diaphragmatic hernia without obstruction or gangrene: Secondary | ICD-10-CM

## 2023-05-25 DIAGNOSIS — G35 Multiple sclerosis: Secondary | ICD-10-CM

## 2023-05-25 DIAGNOSIS — M51379 Other intervertebral disc degeneration, lumbosacral region without mention of lumbar back pain or lower extremity pain: Secondary | ICD-10-CM

## 2023-05-25 DIAGNOSIS — E559 Vitamin D deficiency, unspecified: Secondary | ICD-10-CM | POA: Diagnosis not present

## 2023-05-25 DIAGNOSIS — Z7952 Long term (current) use of systemic steroids: Secondary | ICD-10-CM

## 2023-05-25 DIAGNOSIS — E7849 Other hyperlipidemia: Secondary | ICD-10-CM

## 2023-05-25 DIAGNOSIS — I10 Essential (primary) hypertension: Secondary | ICD-10-CM

## 2023-05-25 DIAGNOSIS — F341 Dysthymic disorder: Secondary | ICD-10-CM

## 2023-05-25 DIAGNOSIS — M816 Localized osteoporosis [Lequesne]: Secondary | ICD-10-CM | POA: Diagnosis not present

## 2023-05-25 DIAGNOSIS — G8929 Other chronic pain: Secondary | ICD-10-CM

## 2023-05-25 DIAGNOSIS — K2270 Barrett's esophagus without dysplasia: Secondary | ICD-10-CM

## 2023-05-25 DIAGNOSIS — Z8719 Personal history of other diseases of the digestive system: Secondary | ICD-10-CM

## 2023-05-27 ENCOUNTER — Ambulatory Visit: Payer: Medicare Other | Admitting: Family Medicine

## 2023-05-27 ENCOUNTER — Other Ambulatory Visit: Payer: Self-pay | Admitting: Nurse Practitioner

## 2023-06-04 ENCOUNTER — Ambulatory Visit (INDEPENDENT_AMBULATORY_CARE_PROVIDER_SITE_OTHER): Payer: Medicare Other | Admitting: Family Medicine

## 2023-06-04 VITALS — BP 138/83 | HR 93 | Temp 97.3°F | Ht 69.0 in | Wt 241.0 lb

## 2023-06-04 DIAGNOSIS — K449 Diaphragmatic hernia without obstruction or gangrene: Secondary | ICD-10-CM

## 2023-06-04 DIAGNOSIS — K2100 Gastro-esophageal reflux disease with esophagitis, without bleeding: Secondary | ICD-10-CM | POA: Diagnosis not present

## 2023-06-04 DIAGNOSIS — R1314 Dysphagia, pharyngoesophageal phase: Secondary | ICD-10-CM

## 2023-06-04 DIAGNOSIS — R110 Nausea: Secondary | ICD-10-CM | POA: Diagnosis not present

## 2023-06-04 DIAGNOSIS — G35 Multiple sclerosis: Secondary | ICD-10-CM | POA: Diagnosis not present

## 2023-06-04 MED ORDER — DIAZEPAM 5 MG PO TABS: ORAL_TABLET | ORAL | 0 refills | Status: DC

## 2023-06-04 NOTE — Progress Notes (Unsigned)
   Subjective:    Patient ID: Natalie Campos, female    DOB: 1960-10-17, 63 y.o.   MRN: [id number]  HPI Patient is doing the best she can with difficult situation.  She has multiple sclerosis.  She was on diazepam at a higher dose because of dystonia.  She has been very hesitant to come off because of her worries that this will recur She is also on pain medicine We have discussed how this is a unhealthy combination She is open to tapering the diazepam.  We have been gradually tapering and will continue to do so   Hiatal hernia  Nausea  Gastroesophageal reflux disease with esophagitis without hemorrhage - Plan: NM Gastric Emptying  Multiple sclerosis (HCC)  Pharyngoesophageal dysphagia - Plan: DG UGI W DOUBLE CM (HD BA)  Patient does have significant hiatal hernia Stomach discomforts.  She is requesting further testing.  We will be setting her up for upper GI with double contrast as well as gastric emptying.  Should be noted that she saw a gastroenterology specialist at [facility] who recommended these tests and then essentially told her she could get it done locally so therefore we are ordering it  Review of Systems     Objective:   Physical Exam General-in no acute distress Eyes-no discharge Lungs-respiratory rate normal, CTA CV-no murmurs,RRR Extremities skin warm dry no edema Neuro grossly normal Behavior normal, alert        Assessment & Plan:   1. Hiatal hernia (Primary) Will be ordering the upper GI with double contrast medium  2. Nausea Upper GI double contrast medium as well as gastric emptying study  3. Gastroesophageal reflux disease with esophagitis without hemorrhage Continue acid blocker  - NM Gastric Emptying  4. Multiple sclerosis (HCC) Will be seeing neurology again this spring We did discuss tapering diazepam gradually every few weeks she will go down to 1/2 tablet this could take a few months to totally get off of the medication but she is making  good progress she denies drowsiness if she has trouble she will let us know  5. Pharyngoesophageal dysphagia See discussion above - DG UGI W DOUBLE CM (HD BA)

## 2023-06-07 ENCOUNTER — Telehealth: Payer: Self-pay | Admitting: *Deleted

## 2023-06-07 ENCOUNTER — Encounter: Payer: Self-pay | Admitting: *Deleted

## 2023-06-07 NOTE — Telephone Encounter (Signed)
Copied from CRM [id number]. Topic: Appointments - Scheduling Inquiry for Clinic >> Jun 07, 2023 12:20 PM Natalie Campos H wrote: Reason for CRM: patient needs to be rescheduled to a different day not able to do 2/5

## 2023-06-07 NOTE — Telephone Encounter (Signed)
Patient given the number to centralized scheduling to reschedule appointment via my chart

## 2023-06-09 ENCOUNTER — Ambulatory Visit: Payer: Self-pay | Admitting: Family Medicine

## 2023-06-15 ENCOUNTER — Ambulatory Visit (HOSPITAL_COMMUNITY)
Admission: RE | Admit: 2023-06-15 | Discharge: 2023-06-15 | Disposition: A | Discharge status: Home / Self Care | Payer: Medicare Other | Source: Ambulatory Visit | Attending: Family Medicine | Admitting: Family Medicine

## 2023-06-15 DIAGNOSIS — K219 Gastro-esophageal reflux disease without esophagitis: Secondary | ICD-10-CM | POA: Diagnosis present

## 2023-06-15 DIAGNOSIS — K2100 Gastro-esophageal reflux disease with esophagitis, without bleeding: Secondary | ICD-10-CM | POA: Diagnosis not present

## 2023-06-15 DIAGNOSIS — K30 Functional dyspepsia: Secondary | ICD-10-CM | POA: Diagnosis not present

## 2023-06-15 DIAGNOSIS — K3184 Gastroparesis: Secondary | ICD-10-CM | POA: Diagnosis not present

## 2023-06-15 MED ORDER — TECHNETIUM TC 99M SULFUR COLLOID
2.0000 | Freq: Once | INTRAVENOUS | Status: AC | PRN
  Administered 2023-06-15: 2 via INTRAVENOUS

## 2023-06-16 ENCOUNTER — Encounter: Payer: Self-pay | Admitting: Family Medicine

## 2023-06-16 ENCOUNTER — Ambulatory Visit (HOSPITAL_COMMUNITY): Payer: Medicare Other

## 2023-06-16 ENCOUNTER — Ambulatory Visit (HOSPITAL_COMMUNITY)
Admission: RE | Admit: 2023-06-16 | Discharge: 2023-06-16 | Disposition: A | Discharge status: Home / Self Care | Payer: Medicare Other | Source: Ambulatory Visit | Attending: Family Medicine | Admitting: Family Medicine

## 2023-06-16 ENCOUNTER — Telehealth: Payer: Self-pay | Admitting: Family Medicine

## 2023-06-16 DIAGNOSIS — R1314 Dysphagia, pharyngoesophageal phase: Secondary | ICD-10-CM | POA: Insufficient documentation

## 2023-06-16 NOTE — Telephone Encounter (Signed)
 Nurses Please call Natalie Campos Let her know that her gastric emptying study showed slowed emptying this could well be due to her pain medicine It would be reasonable for her to see gastroenterology Dr. Debe patient is okay with referral move forward with this  Also the patient recently saw a surgeon at [facility] regarding a hiatal hernia and they requested that we do that upper GI and gastric emptying study Let the patient know that the upper GI does show significant hiatal hernia Let the patient know that we will fax a copy of these results to her surgeon specialist at [facility]  Please fax these results to Tinnie Sing MD [phone]

## 2023-06-17 ENCOUNTER — Other Ambulatory Visit: Payer: Self-pay | Admitting: Neurology

## 2023-06-18 NOTE — Telephone Encounter (Signed)
 Spoke with the patient and has been informed , she states she will call Dr Elton Ham office after she goes [facility] to have endoscopy done at [facility], faxed copy of test results to Dr Clinton Danas fax [phone]

## 2023-06-18 NOTE — Telephone Encounter (Signed)
 Left message for patient to return the call for additional details and recommendations.  See drs notes regarding GI concerns

## 2023-06-22 ENCOUNTER — Other Ambulatory Visit: Payer: Self-pay | Admitting: *Deleted

## 2023-06-22 ENCOUNTER — Other Ambulatory Visit: Payer: Self-pay | Admitting: Neurology

## 2023-06-22 ENCOUNTER — Other Ambulatory Visit: Payer: Self-pay

## 2023-06-22 ENCOUNTER — Other Ambulatory Visit: Payer: Self-pay | Admitting: Internal Medicine

## 2023-06-22 DIAGNOSIS — M816 Localized osteoporosis [Lequesne]: Secondary | ICD-10-CM

## 2023-06-22 NOTE — Telephone Encounter (Unsigned)
Last Fill: 04/02/2023  Labs: 03/29/2023 Glucose 119, SPEP normal. TSH WNL Vitamin D WNL PTH WNL Phosphorus WNL  Next Visit: 11/23/2023  Last Visit: 05/25/2023  DX: Localized osteoporosis without current pathological fracture   Current Dose per office note 05/25/2023:  Tymlos daily injections-started on 04/02/2023.   Okay to refill Tymlos?

## 2023-06-23 MED ORDER — TYMLOS 3120 MCG/1.56ML ~~LOC~~ SOPN: 80.0000 ug | PEN_INJECTOR | Freq: Every day | SUBCUTANEOUS | 0 refills | Status: DC

## 2023-06-28 ENCOUNTER — Telehealth: Payer: Self-pay | Admitting: Family Medicine

## 2023-06-28 NOTE — Telephone Encounter (Signed)
Please advise- previous script states you are tapering

## 2023-06-28 NOTE — Telephone Encounter (Signed)
Refill on  diazepam (VALIUM) 5 MG tablet  [facility]

## 2023-06-29 ENCOUNTER — Encounter: Payer: Self-pay | Admitting: Family Medicine

## 2023-06-29 ENCOUNTER — Other Ambulatory Visit: Payer: Self-pay | Admitting: Family Medicine

## 2023-06-29 MED ORDER — DIAZEPAM 5 MG PO TABS: ORAL_TABLET | ORAL | 0 refills | Status: DC

## 2023-06-29 NOTE — Telephone Encounter (Signed)
FYI-we are continuing to taper New dose half tablet in the morning, half at lunch, half late afternoon, half in the evening for the next 2 weeks then will taper further This was sent to [facility] in [facility] I Will send patient MyChart message thank you

## 2023-07-05 ENCOUNTER — Telehealth: Payer: Self-pay | Admitting: Family Medicine

## 2023-07-05 ENCOUNTER — Encounter: Payer: Self-pay | Admitting: Family Medicine

## 2023-07-05 NOTE — Telephone Encounter (Signed)
 Patient is requesting labs  done before her appointment with Pain Management on 07/31/23. They are requesting her recent labs but she doesn't have any with our office. Please advise.

## 2023-07-05 NOTE — Telephone Encounter (Signed)
 I recommend metabolic 7, lipid, liver, A1c, urine ACR, sed rate, CRP Diagnosis hypertension, hyperglycemia, hyperlipidemia, high risk med, arthralgias, MS

## 2023-07-06 ENCOUNTER — Other Ambulatory Visit: Payer: Self-pay

## 2023-07-06 DIAGNOSIS — R7301 Impaired fasting glucose: Secondary | ICD-10-CM

## 2023-07-06 DIAGNOSIS — G35 Multiple sclerosis: Secondary | ICD-10-CM

## 2023-07-06 DIAGNOSIS — I10 Essential (primary) hypertension: Secondary | ICD-10-CM

## 2023-07-06 DIAGNOSIS — Z79899 Other long term (current) drug therapy: Secondary | ICD-10-CM

## 2023-07-06 DIAGNOSIS — E7849 Other hyperlipidemia: Secondary | ICD-10-CM

## 2023-07-06 DIAGNOSIS — M2550 Pain in unspecified joint: Secondary | ICD-10-CM

## 2023-07-08 LAB — HEPATIC FUNCTION PANEL
ALT: 19 [IU]/L (ref 0–32)
AST: 20 [IU]/L (ref 0–40)
Albumin: 4.4 g/dL (ref 3.9–4.9)
Alkaline Phosphatase: 200 [IU]/L — ABNORMAL HIGH (ref 44–121)
Bilirubin Total: 0.3 mg/dL (ref 0.0–1.2)
Bilirubin, Direct: 0.1 mg/dL (ref 0.00–0.40)
Total Protein: 7.4 g/dL (ref 6.0–8.5)

## 2023-07-08 LAB — MICROALBUMIN / CREATININE URINE RATIO
Creatinine, Urine: 117.4 mg/dL
Microalb/Creat Ratio: 7 mg/g{creat} (ref 0–29)
Microalbumin, Urine: 8.7 ug/mL

## 2023-07-08 LAB — SEDIMENTATION RATE: Sed Rate: 23 mm/h (ref 0–40)

## 2023-07-08 LAB — BASIC METABOLIC PANEL
BUN/Creatinine Ratio: 17 (ref 12–28)
BUN: 13 mg/dL (ref 8–27)
CO2: 25 mmol/L (ref 20–29)
Calcium: 10 mg/dL (ref 8.7–10.3)
Chloride: 99 mmol/L (ref 96–106)
Creatinine, Ser: 0.78 mg/dL (ref 0.57–1.00)
Glucose: 116 mg/dL — ABNORMAL HIGH (ref 70–99)
Potassium: 4.1 mmol/L (ref 3.5–5.2)
Sodium: 141 mmol/L (ref 134–144)
eGFR: 85 mL/min/{1.73_m2} (ref >59)

## 2023-07-08 LAB — LIPID PANEL
Chol/HDL Ratio: 2.7 {ratio} (ref 0.0–4.4)
Cholesterol, Total: 206 mg/dL — ABNORMAL HIGH (ref 100–199)
HDL: 76 mg/dL (ref >39)
LDL Chol Calc (NIH): 101 mg/dL — ABNORMAL HIGH (ref 0–99)
Triglycerides: 174 mg/dL — ABNORMAL HIGH (ref 0–149)
VLDL Cholesterol Cal: 29 mg/dL (ref 5–40)

## 2023-07-08 LAB — C-REACTIVE PROTEIN: CRP: 11 mg/L — ABNORMAL HIGH (ref 0–10)

## 2023-07-08 LAB — HEMOGLOBIN A1C
Est. average glucose Bld gHb Est-mCnc: 108 mg/dL
Hgb A1c MFr Bld: 5.4 % (ref 4.8–5.6)

## 2023-07-09 ENCOUNTER — Encounter: Payer: Self-pay | Admitting: Family Medicine

## 2023-07-13 ENCOUNTER — Other Ambulatory Visit (INDEPENDENT_AMBULATORY_CARE_PROVIDER_SITE_OTHER): Payer: Self-pay | Admitting: Gastroenterology

## 2023-07-14 ENCOUNTER — Ambulatory Visit: Payer: Medicare Other | Admitting: Neurology

## 2023-07-14 ENCOUNTER — Encounter: Payer: Self-pay | Admitting: Neurology

## 2023-07-14 ENCOUNTER — Other Ambulatory Visit: Payer: Self-pay | Admitting: Neurology

## 2023-07-14 VITALS — Resp 16 | Wt 242.0 lb

## 2023-07-14 DIAGNOSIS — R269 Unspecified abnormalities of gait and mobility: Secondary | ICD-10-CM | POA: Diagnosis not present

## 2023-07-14 DIAGNOSIS — R208 Other disturbances of skin sensation: Secondary | ICD-10-CM

## 2023-07-14 DIAGNOSIS — R252 Cramp and spasm: Secondary | ICD-10-CM

## 2023-07-14 DIAGNOSIS — H02401 Unspecified ptosis of right eyelid: Secondary | ICD-10-CM

## 2023-07-14 DIAGNOSIS — G35 Multiple sclerosis: Secondary | ICD-10-CM | POA: Diagnosis not present

## 2023-07-14 MED ORDER — BACLOFEN 10 MG PO TABS: ORAL_TABLET | ORAL | 3 refills | Status: DC

## 2023-07-14 NOTE — Progress Notes (Signed)
 [facility]  PATIENT: Natalie Campos DOB: 1960-05-25  REFERRING DOCTOR OR PCP [name] MD SOURCE: Patient, notes from Dr. Gerilyn Pilgrim, imaging and lab reports, MRI images personally reviewed.  _________________________________   HISTORICAL  CHIEF COMPLAINT:  Chief Complaint  Patient presents with   Multiple Sclerosis    Rm11, husband present, Multiple sclerosis: has Gait disturbance uses rollater/walker, Spasms of the hands or feet Dysesthesia:pt stated gotten progressively worse since stopped diazepam and tremors bilateral hands, lips, face, bilateral legs, mouth jittering    HISTORY OF PRESENT ILLNESS:  Natalie Campos is a 63 y.o. woman with a diagnosis of multiple sclerosis.  UPDATE 07/14/2023: She was diagnosed with MS around 1983.  Recent MRIs show overall low plaque burden.  She continues to report difficulties with her gait, fatigue and spasticity.  She has a lot of spasticity.   She felt better on valium but since also on Dilaudid, the combination has more risk and the Valium has been discontinued.  We discussed going up on the baclofen and staying off the valium.  She notes that her toes curl up.   Currently, she has difficulty with gait and uses a cane and uses a walker if more tired.    Her left leg is weaker than the right.   She notes dystonia in the feet, left worse than right.     She notes the right hand has a reduced grip.    Handwriting is poor.  She is currently on baclofen 10 mg p.o. twice daily and she is also on gabapentin 600 mg po tid for neuropathic pain.   She is also on Dilaudid for musculoskeletal pain.  Lyrica had helped better but she gained 30 pounds  She has urinary urgency, nocturia and some nocturnal incontinence. Hesitancy is better with tamsulosin.   She has nocturia x 3-4 at night.  Tamsulosin has helped some   Myrbetriq worked better but was too expensive.   She has severe fatigue most afternoons.  She takes modafinil at 9 am  and 11 am.    She has only mild benefit.    She sleeps well most days.  Adderall has helped her fatigue and sleepiness some but was poorly tolerated due to elevated blood pressure.  Therefore, she rarely takes.     She has left knee DJD but due to poor gait, orthopedics is not planning surgery due to her risks    MS History: She was diagnosed with MS in her 20's after presenting with left ON.   She had another exacerbation in her early 30's affecting strength and gait.   She was placed on Avonex in 1994/1995 but had flu-like reactions and felt weaker.   She was placed on Copaxone a coupe years later and stopped around 2005 due to breakthrough activity on MRI and lipoatrophy.   She was on no DMT for 8-10 years and then placed on Ocrevus in 2018.    She is on Ocrevus since 2018.  She has had a rash after each infusion that would be off/on in between infusions.   She also was diagnosed with psoriasis afterwards.   Although she did very well initially, she has had a few times when she has needed to be in wheelchair.  She feels over the last 10 to 20 years that there have not been exacerbations though there have been times she has done worse.  She notes that her slope of progression on Ocrevus is probably about the same as  it had been before the Ocrevus.  Serial MRIs between 2006 and 2023 have not shown new MS lesions in the brain.  She has an overall low plaque burden in the brain.  There are no lesions in the brainstem.  She does not have foci in the cervical spine.  She has not had a thoracic spine scan.   IMAGING: MRI of the brain 07/23/2021, 07/20/2012 and 08/13/2009 showed identical pattern of T2/FLAIR hyperintense foci in the periventricular, juxtacortical   white matter.  The overall plaque burden is low.  There were no foci in the infratentorial white matter.  MRI of the cervical spine 07/23/2021 and 08/13/2009 showed normal spinal cord.  She does have some degenerative changes.  No spinal stenosis.  She  has degenerative changes at C5-C6 and C6-C7 with potential for left C6 and right C7 nerve root compression on the more recent images.Marland Kitchen   REVIEW OF SYSTEMS: Constitutional: No fevers, chills, sweats, or change in appetite Eyes: No visual changes, double vision, eye pain Ear, nose and throat: No hearing loss, ear pain, nasal congestion, sore throat Cardiovascular: No chest pain, palpitations Respiratory:  No shortness of breath at rest or with exertion.   No wheezes GastrointestinaI: No nausea, vomiting, diarrhea, abdominal pain, fecal incontinence.  Has GERD Genitourinary:  No dysuria, urinary retention or frequency.  No nocturia. Musculoskeletal:  No neck pain, back pain Integumentary: No rash, pruritus, skin lesions Neurological: as above Psychiatric: No depression at this time.  No anxiety Endocrine: No palpitations, diaphoresis, change in appetite, change in weigh or increased thirst Hematologic/Lymphatic:  No anemia, purpura, petechiae. Allergic/Immunologic: No itchy/runny eyes, nasal congestion, recent allergic reactions, rashes  ALLERGIES: Allergies  Allergen Reactions   Ace Inhibitors Anaphylaxis   Atenolol Anaphylaxis   Losartan Swelling    Severe tongue swelling   Penicillins     Childhood Allergy - caused coma  Has patient had a PCN reaction causing immediate rash, facial/tongue/throat swelling, SOB or lightheadedness with hypotension: No Has patient had a PCN reaction causing severe rash involving mucus membranes or skin necrosis: No Has patient had a PCN reaction that required hospitalization: No Has patient had a PCN reaction occurring within the last 10 years: No If all of the above answers are "NO", then may proceed with Cephalosporin use.     HOME MEDICATIONS:  Current Outpatient Medications:    Abaloparatide (TYMLOS) 3120 MCG/1.56ML SOPN, Inject 80 mcg into the skin at bedtime., Disp: 4.68 mL, Rfl: 0   amLODipine (NORVASC) 5 MG tablet, TAKE 1 TABLET BY MOUTH  DAILY, Disp: 100 tablet, Rfl: 2   ARIPiprazole (ABILIFY) 5 MG tablet, TAKE 1 TABLET BY MOUTH AT BEDTIME FOR DEPRESSION, Disp: 90 tablet, Rfl: 0   clobetasol cream (TEMOVATE) 0.05 %, Apply 1 Application topically as needed., Disp: , Rfl:    docusate sodium (COLACE) 100 MG capsule, Take 100 mg by mouth 2 (two) times daily., Disp: , Rfl:    DULoxetine (CYMBALTA) 60 MG capsule, Take 60 mg by mouth daily. , Disp: , Rfl:    ferrous sulfate 325 (65 FE) MG EC tablet, Take 325 mg by mouth once a week., Disp: , Rfl:    fish oil-omega-3 fatty acids 1000 MG capsule, Take 1 g by mouth daily., Disp: , Rfl:    fluticasone (FLONASE) 50 MCG/ACT nasal spray, Place 2 sprays into both nostrils daily., Disp: 16 g, Rfl: 5   gabapentin (NEURONTIN) 600 MG tablet, TAKE 1 TABLET BY MOUTH 3 TIMES  DAILY, Disp: 240 tablet,  Rfl: 0   hydrochlorothiazide (MICROZIDE) 12.5 MG capsule, TAKE 1 CAPSULE BY MOUTH DAILY, Disp: 90 capsule, Rfl: 3   HYDROmorphone (DILAUDID) 4 MG tablet, Take 4 mg by mouth 3 (three) times daily., Disp: , Rfl:    Insulin Pen Needle 31G X 5 MM MISC, Use to inject Tymlos once daily. DO NOT REUSE. Sent via fax to [facility]. Future refills to [facility], Disp: 100 each, Rfl: 2   modafinil (PROVIGIL) 200 MG tablet, TAKE 1 TABLET BY MOUTH IN THE MORNING AND 1 TABLET AT NOON, Disp: 60 tablet, Rfl: 4   multivitamin (THERAGRAN) per tablet, Take 1 tablet by mouth daily., Disp: , Rfl:    naloxone (NARCAN) nasal spray 4 mg/0.1 mL, Use as directed for opioid overdose, Disp: 1 each, Rfl: 0   nystatin (MYCOSTATIN/NYSTOP) powder, SMARTSIG:Packet(s) Topical 4 Times Daily, Disp: , Rfl:    olopatadine (PATANOL) 0.1 % ophthalmic solution, Place 1 drop into both eyes 2 (two) times daily., Disp: 5 mL, Rfl: 12   polyethylene glycol powder (GLYCOLAX/MIRALAX) 17 GM/SCOOP powder, MIX 25.5 GRAMS WITH WATER OR JUICE ONCE DAILY. (Patient taking differently: Take 25.5 g by mouth daily. MIX 25.5 GRAMS WITH WATER OR JUICE ONCE  DAILY.), Disp: 510 g, Rfl: 5   potassium chloride (KLOR-CON) 10 MEQ tablet, TAKE 1 TABLET BY MOUTH TWICE  DAILY, Disp: 200 tablet, Rfl: 2   RABEprazole (ACIPHEX) 20 MG tablet, Take 1 tablet by mouth twice daily, Disp: 60 tablet, Rfl: 0   tamsulosin (FLOMAX) 0.4 MG CAPS capsule, Take 1 capsule by mouth once daily, Disp: 30 capsule, Rfl: 0   triamcinolone cream (KENALOG) 0.1 %, Apply 1 Application topically 2 (two) times daily. Prn rash; use up to 2 weeks, Disp: 30 g, Rfl: 0   Vitamin D, Ergocalciferol, (DRISDOL) 1.25 MG (50000 UNIT) CAPS capsule, Take 50,000 Units by mouth every 7 (seven) days., Disp: , Rfl:    baclofen (LIORESAL) 10 MG tablet, One po qid, Disp: 360 each, Rfl: 3  PAST MEDICAL HISTORY: Past Medical History:  Diagnosis Date   Anemia    Angio-edema    Anxiety    Barrett's esophagus    Bulging discs    Cataract    Complication of anesthesia    Complication of anesthesia    patietn wakes up easily- has anxiety due to waking up during a simple procedure   Depression    DJD (degenerative joint disease)    Dog bite(E906.0) 12/06/2009   Eczema    GERD (gastroesophageal reflux disease)    High triglycerides    Hyperlipidemia    Hypertension    IBS (irritable bowel syndrome)    contipation predominant    Iron deficiency anemia    Multiple sclerosis (HCC) 05/11/1988   Dx in 1990 most recent hospitalization for a flareis in 2011    Obesity    Osteoporosis 02/2023    PAST SURGICAL HISTORY: Past Surgical History:  Procedure Laterality Date   bilateral cataract surgery      BIOPSY  01/28/2011   Procedure: BIOPSY;  Surgeon: Malissa Hippo, MD;  [facility];  Service: Endoscopy;  Laterality: N/A;   BIOPSY N/A 08/25/2013   Procedure: BIOPSY;  Surgeon: Malissa Hippo, MD;  Location: [facility];  Service: Endoscopy;  Laterality: N/A;   BIOPSY  02/12/2021   Procedure: BIOPSY;  Surgeon: Malissa Hippo, MD;  [facility];  Service: Endoscopy;;    CHOLECYSTECTOMY  05/11/2008   Dr. Gabriel Cirri    COLONOSCOPY  WITH PROPOFOL N/A 08/25/2013   Procedure: COLONOSCOPY WITH PROPOFOL;  Surgeon: Malissa Hippo, MD;  Location: [facility];  Service: Endoscopy;  Laterality: N/A;  in cecum at 0811 out at 0822 = 11 minutes   ESOPHAGOGASTRODUODENOSCOPY  01/28/2011   Procedure: ESOPHAGOGASTRODUODENOSCOPY (EGD);  Surgeon: Malissa Hippo, MD;  [facility];  Service: Endoscopy;  Laterality: N/A;  1:00   ESOPHAGOGASTRODUODENOSCOPY  03/24/2021   Procedure: ESOPHAGOGASTRODUODENOSCOPY (EGD);  Surgeon: Luretha Murphy, MD;  Location: [facility];  Service: General;;   ESOPHAGOGASTRODUODENOSCOPY (EGD) WITH PROPOFOL N/A 08/25/2013   Procedure: ESOPHAGOGASTRODUODENOSCOPY (EGD) WITH PROPOFOL;  Surgeon: Malissa Hippo, MD;  Location: [facility];  Service: Endoscopy;  Laterality: N/A;   ESOPHAGOGASTRODUODENOSCOPY (EGD) WITH PROPOFOL N/A 02/12/2021   Procedure: ESOPHAGOGASTRODUODENOSCOPY (EGD) WITH PROPOFOL;  Surgeon: Malissa Hippo, MD;  [facility];  Service: Endoscopy;  Laterality: N/A;  7:30   ESOPHAGOGASTRODUODENOSCOPY (EGD) WITH PROPOFOL N/A 03/30/2023   Procedure: ESOPHAGOGASTRODUODENOSCOPY (EGD) WITH PROPOFOL;  Surgeon: Dolores Frame, MD;  [facility];  Service: Gastroenterology;  Laterality: N/A;  1:15PM;ASA 3   WISDOM TOOTH EXTRACTION     XI ROBOTIC ASSISTED HIATAL HERNIA REPAIR N/A 03/24/2021   Procedure: XI ROBOTIC ASSISTED HIATAL HERNIA REPAIR WITH FUNDOPLICATION;  Surgeon: Luretha Murphy, MD;  Location: [facility];  Service: General;  Laterality: N/A;    FAMILY HISTORY: Family History  Problem Relation Age of Onset   COPD Mother    Alcoholism Mother    Mental illness Mother    Alcoholism Father    Eczema Father    Asthma Sister    Hypertension Sister    Asthma Brother    Asthma Maternal Uncle    Asthma Paternal Uncle    Allergic rhinitis Daughter    Ulcerative colitis Daughter    Asthma Daughter    Healthy  Daughter    Irritable bowel syndrome Daughter     SOCIAL HISTORY: Social History   Socioeconomic History   Marital status: Married    Spouse name: Not on file   Number of children: 3   Years of education: Not on file   Highest education level: GED or equivalent  Occupational History   Occupation: disabled since 2002  Tobacco Use   Smoking status: Former    Current packs/day: 0.00    Average packs/day: 1.5 packs/day for 30.0 years (45.0 ttl pk-yrs)    Types: Cigarettes    Start date: 03/08/1978    Quit date: 03/08/2008    Years since quitting: 15.3    Passive exposure: Past   Smokeless tobacco: Never  Vaping Use   Vaping status: Never Used  Substance and Sexual Activity   Alcohol use: No    Alcohol/week: 0.0 standard drinks of alcohol   Drug use: No   Sexual activity: Yes    Birth control/protection: Post-menopausal  Other Topics Concern   Not on file  Social History Narrative   Two grown children, still birth infant    Social Drivers of Health   Financial Resource Strain: Medium Risk (06/03/2023)   Overall Financial Resource Strain (CARDIA)    Difficulty of Paying Living Expenses: Somewhat hard  Food Insecurity: No Food Insecurity (06/03/2023)   Hunger Vital Sign    Worried About Running Out of Food in the Last Year: Never true    Ran Out of Food in the Last Year: Never true  Transportation Needs: No Transportation Needs (06/03/2023)   PRAPARE - Administrator, Civil Service (Medical):  No    Lack of Transportation (Non-Medical): No  Physical Activity: Unknown (06/03/2023)   Exercise Vital Sign    Days of Exercise per Week: 0 days    Minutes of Exercise per Session: Patient declined  Stress: Stress Concern Present (06/03/2023)   [employer] of Occupational Health - Occupational Stress Questionnaire    Feeling of Stress : To some extent  Social Connections: Socially Isolated (06/03/2023)   Social Connection and Isolation Panel [NHANES]    Frequency  of Communication with Friends and Family: Once a week    Frequency of Social Gatherings with Friends and Family: Never    Attends Religious Services: Never    Database administrator or Organizations: No    Attends Engineer, structural: Not on file    Marital Status: Married  Catering manager Violence: Not At Risk (11/03/2022)   [facility], [facility]   Humiliation, Afraid, Rape, and Kick questionnaire    Fear of Current or Ex-Partner: No    Emotionally Abused: No    Physically Abused: No    Sexually Abused: No       PHYSICAL EXAM  Vitals:   07/14/23 1447  Resp: 16  Weight: 242 lb (109.8 kg)    Body mass index is 35.74 kg/m.   General: The patient is well-developed and well-nourished and in no acute distress  HEENT:  Head is [place]/AT.  Sclera are anicteric.    Skin: Extremities are without rash or  edema.  Musculoskeletal:  Back is nontender  Neurologic Exam  Mental status: The patient is alert and oriented x 3 at the time of the examination. The patient has apparent normal recent and remote memory, with an apparently normal attention span and concentration ability.   Speech is normal.  Cranial nerves: Extraocular movements are full.   Facial strength and sensation was normal.  No dysarthria. No obvious hearing deficits are noted.  Motor:  Muscle bulk is normal.   Tone is normal. Strength is 4/5 in the iliopsoas and 4+/5 in other proximal limb muscles and 5 / 5 in distal muscles  Sensory: Sensory testing is intact to pinprick, soft touch and vibration sensation in the arms.  She reported reduced vibration sensation in the left leg relative to the right..  Coordination: Cerebellar testing reveals good finger-nose-finger and heel-to-shin bilaterally.  Gait and station: She needs to use her arms to rise from a chair.  Once up the station was stable.  Her gait was wide.  Gait is very poor without her walker.  She takes mildly reduced stride  with a walker.  Romberg is negative.   Reflexes: Deep tendon reflexes are symmetric and normal in the arms and knees and 1 at the ankles.Marland Kitchen       DIAGNOSTIC DATA (LABS, IMAGING, TESTING) - I reviewed patient records, labs, notes, testing and imaging myself where available.  Lab Results  Component Value Date   WBC 7.1 03/29/2023   HGB 14.3 03/29/2023   HCT 42.0 03/29/2023   MCV 94.4 03/29/2023   PLT 333 03/29/2023      Component Value Date/Time   NA 141 07/07/2023 1438   K 4.1 07/07/2023 1438   CL 99 07/07/2023 1438   CO2 25 07/07/2023 1438   GLUCOSE 116 (H) 07/07/2023 1438   GLUCOSE 119 (H) 03/29/2023 1135   BUN 13 07/07/2023 1438   CREATININE 0.78 07/07/2023 1438   CREATININE 0.78 09/18/2013 0949   CALCIUM 10.0 07/07/2023 1438  PROT 7.4 07/07/2023 1438   ALBUMIN 4.4 07/07/2023 1438   AST 20 07/07/2023 1438   ALT 19 07/07/2023 1438   ALKPHOS 200 (H) 07/07/2023 1438   BILITOT 0.3 07/07/2023 1438   GFRNONAA >60 03/29/2023 1135   GFRAA 77 03/04/2020 1446   Lab Results  Component Value Date   CHOL 206 (H) 07/07/2023   HDL 76 07/07/2023   LDLCALC 101 (H) 07/07/2023   TRIG 174 (H) 07/07/2023   CHOLHDL 2.7 07/07/2023   Lab Results  Component Value Date   HGBA1C 5.4 07/07/2023   Lab Results  Component Value Date   VITAMINB12 910 06/11/2014   Lab Results  Component Value Date   TSH 3.140 03/29/2023       ASSESSMENT AND PLAN  MULTIPLE SCLEROSIS  Gait disturbance  Spasms of the hands or feet  Dysesthesia  Ptosis of right eyelid   We discussed that she has a low plaque burden in the brain and no lesions in the spinal cord noted on MRI.  Therefore, I do not have a good explanation for her spasticity or gait issues.  I last year so no recent activity.  She will remain off of a disease modifying therapy for now.  In the future, if breakthrough activity is occurring we would need to get her on a disease modifying therapy.   Mavenclad or Aubagio would be  other choices. Stay active and exercise as tolerated. She will remain off of the Valium.  I will increase her baclofen from 10 mg p.o. twice daily to 4 times daily  Return in 6 months or sooner if there are new or worsening neurologic symptoms..   This visit is part of a comprehensive longitudinal care medical relationship regarding the patients primary diagnosis of MS and related concerns.   Joanna Borawski A. Epimenio Foot, MD, [facility] 07/14/2023, 9:18 PM Certified in Neurology, Clinical Neurophysiology, Sleep Medicine and Neuroimaging  [facility] [address], [address] [place], [place] [address] [phone]

## 2023-07-15 ENCOUNTER — Telehealth (INDEPENDENT_AMBULATORY_CARE_PROVIDER_SITE_OTHER): Payer: Medicare Other | Admitting: Family Medicine

## 2023-07-15 ENCOUNTER — Encounter: Payer: Self-pay | Admitting: Family Medicine

## 2023-07-15 DIAGNOSIS — G35 Multiple sclerosis: Secondary | ICD-10-CM | POA: Diagnosis not present

## 2023-07-15 NOTE — Progress Notes (Signed)
   Subjective:    Patient ID: Natalie Campos, female    DOB: 16-May-1960, 63 y.o.   MRN: [id number]  HPI  Patient has MS Has underlying spasticity issues Saw neurologist yesterday They are using muscle relaxers She is successfully tapered off of diazepam She is on pain medicine through pain management Denies any drowsiness  Virtual Visit via Video Note  I connected with Natalie Campos on 07/15/23 at  9:20 AM EST by a video enabled telemedicine application and verified that I am speaking with the correct person using two identifiers.  Location: Patient: Home Provider: Office   I discussed the limitations of evaluation and management by telemedicine and the availability of in person appointments. The patient expressed understanding and agreed to proceed.  History of Present Illness:    Observations/Objective:   Assessment and Plan:   Follow Up Instructions:    I discussed the assessment and treatment plan with the patient. The patient was provided an opportunity to ask questions and all were answered. The patient agreed with the plan and demonstrated an understanding of the instructions.   The patient was advised to call back or seek an in-person evaluation if the symptoms worsen or if the condition fails to improve as anticipated.  I provided 15 minutes of non-face-to-face time during this encounter.   [name], MD Denies depressed Patient is concerned about her weight she is trying to watch her diet trying to stay more physically active it is difficult for her to be active because of her MS Review of Systems     Objective:   Physical Exam Patient had virtual visit-video Appears to be in no distress Atraumatic Neuro able to relate and oriented No apparent resp distress Color normal    She will do it in person follow-up within 4 to 6 months    Assessment & Plan:  MS Stable Following through with neurology  Spasticity using muscle relaxers has tapered  off of diazepam Weight-patient trying portion control regular physical activity

## 2023-07-19 ENCOUNTER — Ambulatory Visit (INDEPENDENT_AMBULATORY_CARE_PROVIDER_SITE_OTHER): Payer: Medicare Other | Admitting: Gastroenterology

## 2023-07-26 ENCOUNTER — Encounter: Payer: Self-pay | Admitting: Family Medicine

## 2023-07-29 ENCOUNTER — Ambulatory Visit (INDEPENDENT_AMBULATORY_CARE_PROVIDER_SITE_OTHER): Admitting: Family Medicine

## 2023-07-29 ENCOUNTER — Encounter: Payer: Self-pay | Admitting: Family Medicine

## 2023-07-29 VITALS — BP 146/96 | HR 100 | Temp 97.9°F | Ht 69.0 in | Wt 239.0 lb

## 2023-07-29 DIAGNOSIS — G35 Multiple sclerosis: Secondary | ICD-10-CM | POA: Diagnosis not present

## 2023-07-29 DIAGNOSIS — I10 Essential (primary) hypertension: Secondary | ICD-10-CM

## 2023-07-29 DIAGNOSIS — R42 Dizziness and giddiness: Secondary | ICD-10-CM

## 2023-07-29 MED ORDER — INDAPAMIDE 1.25 MG PO TABS
1.2500 mg | ORAL_TABLET | Freq: Every day | ORAL | 6 refills | Status: DC
Start: 2023-07-29 — End: 2023-12-29

## 2023-07-29 NOTE — Progress Notes (Addendum)
 Subjective:    Patient ID: Natalie Campos, female    DOB: 03-06-1961, 63 y.o.   MRN: [id number]  HPI Here today for evaluation of feeling Natalie Campos She states at times she feels dizzy Denies double vision Denies headaches Recently on a new MS medicine Also was out of her long-acting pain medicine for couple months and recently started back on her long-acting pain medicine while still taking her short acting at the same dose She also is using a muscle relaxer 3 times a day that she denies causing drowsiness but it is a new muscle relaxer She is no longer on diazepam She has been checking her blood pressure at home and getting very high readings but her blood pressure cuff does not fit adequately on the left arm she denies any chest tightness pressure pain shortness of breath  Results for orders placed or performed in visit on 07/06/23  Basic Metabolic Panel   Collection Time: 07/07/23  2:38 PM  Result Value Ref Range   Glucose 116 (H) 70 - 99 mg/dL   BUN 13 8 - 27 mg/dL   Creatinine, Ser 8.65 0.57 - 1.00 mg/dL   eGFR 85 >78 IO/NGE/9.52   BUN/Creatinine Ratio 17 12 - 28   Sodium 141 134 - 144 mmol/L   Potassium 4.1 3.5 - 5.2 mmol/L   Chloride 99 96 - 106 mmol/L   CO2 25 20 - 29 mmol/L   Calcium 10.0 8.7 - 10.3 mg/dL  Lipid Panel   Collection Time: 07/07/23  2:38 PM  Result Value Ref Range   Cholesterol, Total 206 (H) 100 - 199 mg/dL   Triglycerides 841 (H) 0 - 149 mg/dL   HDL 76 >32 mg/dL   VLDL Cholesterol Cal 29 5 - 40 mg/dL   LDL Chol Calc (NIH) 440 (H) 0 - 99 mg/dL   Chol/HDL Ratio 2.7 0.0 - 4.4 ratio  Hepatic Function Panel   Collection Time: 07/07/23  2:38 PM  Result Value Ref Range   Total Protein 7.4 6.0 - 8.5 g/dL   Albumin 4.4 3.9 - 4.9 g/dL   Bilirubin Total 0.3 0.0 - 1.2 mg/dL   Bilirubin, Direct 1.02 0.00 - 0.40 mg/dL   Alkaline Phosphatase 200 (H) 44 - 121 IU/L   AST 20 0 - 40 IU/L   ALT 19 0 - 32 IU/L  Hemoglobin A1c   Collection Time: 07/07/23  2:38 PM   Result Value Ref Range   Hgb A1c MFr Bld 5.4 4.8 - 5.6 %   Est. average glucose Bld gHb Est-mCnc 108 mg/dL  Microalbumin/Creatinine Ratio, Urine   Collection Time: 07/07/23  2:38 PM  Result Value Ref Range   Creatinine, Urine 117.4 Not Estab. mg/dL   Microalbumin, Urine 8.7 Not Estab. ug/mL   Microalb/Creat Ratio 7 0 - 29 mg/g creat  Sedimentation Rate   Collection Time: 07/07/23  2:38 PM  Result Value Ref Range   Sed Rate 23 0 - 40 mm/hr  C-reactive protein   Collection Time: 07/07/23  2:38 PM  Result Value Ref Range   CRP 11 (H) 0 - 10 mg/L    Review of Systems we did review over the labs from a couple weeks ago urine micro protein looks good.  A1c under good control.  Cholesterol slightly elevated continue current measures     Objective:   Physical Exam  General-in no acute distress Eyes-no discharge Lungs-respiratory rate normal, CTA CV-no murmurs,RRR Extremities skin warm dry no edema Neuro grossly normal Behavior  normal, alert       Assessment & Plan:  HTN-subpar control change diuretic from HCTZ new diuretic indapamide 1.25 mg follow-up in June Get new blood pressure cuff for home with large cuff send [place] some readings over the next few weeks Let us know if ongoing troubles Neurologically I find no evidence of any type of stroke on today's exam I suspect that she is having side effects from the high blood pressure as well as the increase in pain medicine she has had recently by adding back her long-acting pain medicine while on muscle relaxers I did caution her with this and told her that she should try reducing the short acting hydromorphone over the course the next few days see if that makes a difference and report to her pain management doctor No need to do MRI currently patient is a keep [place] updated  Patient states moods are doing okay overall.  She is discouraged by her weight gain.  I would recommend for her to stop Abilify because of potential side effects or  weight gain  I would recommend continuing Cymbalta over time as she does not feel like that is doing well we can always stop that and try Zoloft

## 2023-08-08 ENCOUNTER — Other Ambulatory Visit: Payer: Self-pay | Admitting: Neurology

## 2023-08-09 ENCOUNTER — Other Ambulatory Visit (INDEPENDENT_AMBULATORY_CARE_PROVIDER_SITE_OTHER): Payer: Self-pay | Admitting: Gastroenterology

## 2023-08-09 NOTE — Telephone Encounter (Signed)
 Last seen on 07/14/23 Follow up scheduled on 02/16/24

## 2023-08-20 ENCOUNTER — Ambulatory Visit: Payer: Medicare Other

## 2023-08-24 ENCOUNTER — Other Ambulatory Visit: Payer: Self-pay | Admitting: Nurse Practitioner

## 2023-08-26 ENCOUNTER — Encounter: Payer: Self-pay | Admitting: Family Medicine

## 2023-08-26 ENCOUNTER — Other Ambulatory Visit: Payer: Self-pay | Admitting: Neurology

## 2023-08-27 ENCOUNTER — Other Ambulatory Visit: Payer: Self-pay | Admitting: Family Medicine

## 2023-08-27 MED ORDER — ARIPIPRAZOLE 5 MG PO TABS: ORAL_TABLET | ORAL | 2 refills | Status: DC

## 2023-10-11 ENCOUNTER — Other Ambulatory Visit: Payer: Self-pay | Admitting: *Deleted

## 2023-10-11 DIAGNOSIS — M816 Localized osteoporosis [Lequesne]: Secondary | ICD-10-CM

## 2023-10-11 MED ORDER — TYMLOS 3120 MCG/1.56ML ~~LOC~~ SOPN: 80.0000 ug | PEN_INJECTOR | Freq: Every day | SUBCUTANEOUS | 0 refills | Status: DC

## 2023-10-11 NOTE — Telephone Encounter (Signed)
 Last Fill: 06/23/2023  Labs: 07/07/2023 BMP Glucose 116, 03/29/2023 CBC/CMP Glucose 119  Next Visit: 11/23/2023  Last Visit: 05/25/2023  DX: Localized osteoporosis without current pathological fracture   Current Dose per office note 05/25/2023: Tymlos  daily injections   Okay to refill Tymlos ?

## 2023-10-20 ENCOUNTER — Telehealth: Payer: Medicare Other | Admitting: Family Medicine

## 2023-10-20 ENCOUNTER — Ambulatory Visit (INDEPENDENT_AMBULATORY_CARE_PROVIDER_SITE_OTHER): Admitting: Family Medicine

## 2023-10-20 VITALS — BP 114/74 | HR 78 | Temp 98.1°F | Ht 69.0 in | Wt 233.2 lb

## 2023-10-20 DIAGNOSIS — F419 Anxiety disorder, unspecified: Secondary | ICD-10-CM | POA: Diagnosis not present

## 2023-10-20 DIAGNOSIS — G35 Multiple sclerosis: Secondary | ICD-10-CM | POA: Diagnosis not present

## 2023-10-20 DIAGNOSIS — Z79899 Other long term (current) drug therapy: Secondary | ICD-10-CM

## 2023-10-20 DIAGNOSIS — K5900 Constipation, unspecified: Secondary | ICD-10-CM

## 2023-10-20 DIAGNOSIS — I10 Essential (primary) hypertension: Secondary | ICD-10-CM

## 2023-10-20 DIAGNOSIS — R7301 Impaired fasting glucose: Secondary | ICD-10-CM

## 2023-10-20 DIAGNOSIS — E7849 Other hyperlipidemia: Secondary | ICD-10-CM

## 2023-10-20 DIAGNOSIS — F324 Major depressive disorder, single episode, in partial remission: Secondary | ICD-10-CM

## 2023-10-20 MED ORDER — LUBIPROSTONE 8 MCG PO CAPS: 8.0000 ug | ORAL_CAPSULE | Freq: Two times a day (BID) | ORAL | 4 refills | Status: DC

## 2023-10-20 MED ORDER — BUSPIRONE HCL 5 MG PO TABS
5.0000 mg | ORAL_TABLET | Freq: Two times a day (BID) | ORAL | 5 refills | Status: DC
Start: 2023-10-20 — End: 2023-11-15

## 2023-10-20 NOTE — Addendum Note (Signed)
 Addended by: Charlotta Cook A on: 10/20/2023 08:07 PM   Modules accepted: Orders

## 2023-10-20 NOTE — Progress Notes (Signed)
 Subjective:    Patient ID: Natalie Campos, female    DOB: Dec 02, 1960, 63 y.o.   MRN: [id number]  HPI Discussed the use of AI scribe software for clinical note transcription with the patient, who gave verbal consent to proceed.  History of Present Illness   Natalie Campos is a 63 year old female with multiple sclerosis who presents with constipation and anxiety. She is accompanied by her husband, who assists with transportation to appointments.  She experiences constipation with bowel movements occurring every three to four days, describing the stools as very hard and painful. She has tried Miralax  without success and consumes a diet high in beans to increase fiber intake. Her daughter uses Linzess and stool softeners, but Linzess is not covered by her insurance.  She has a history of multiple sclerosis, which has been problematic recently. Her last specialist visit was in March, and she is scheduled for a follow-up in six months.  She experiences anxiety, feeling snappy, anxious, and nervous. She engages in activities like playing Mahjong and Solitaire to manage her anxiety. She has been off Valium  for a few months, which she believes was masking her anxiety. Her current medications include Exidine  and Abilify .  She has not driven in three to four months due to anxiety and lack of confidence in her reflexes. She wants to regain her ability to drive independently.        Review of Systems     Objective:   Physical Exam  General-in no acute distress Eyes-no discharge Lungs-respiratory rate normal, CTA CV-no murmurs,RRR Extremities skin warm dry no edema Neuro grossly normal Behavior normal, alert       Assessment & Plan:  Assessment and Plan    Constipation Chronic constipation with infrequent, hard stools. Miralax  ineffective. Linzess not covered by insurance. Amitiza covered and may help. - Prescribe Amitiza as a trial.  Multiple Sclerosis (MS) MS contributes  to chronic pain and anxiety. Regular specialist follow-ups every six months. Last appointment in March.  Chronic Pain Chronic pain managed with monthly appointments in [place]. Exidine  used for pain and depression. Transportation coordinated with daughter's help.  Anxiety Significant anxiety impacts daily activities. Current medications include Exidine  and Abilify . Not interested in benzodiazepines. Buspar considered to reduce anxiety. - Prescribe Buspar 5 mg twice daily.  Depression Mood instability and irritability. Exidine  and Abilify  help manage symptoms. Mood changes possibly related to Valium  discontinuation.  General Health Maintenance Blood pressure controlled. Previous blood work showed good A1c and kidney function. No immediate need for blood work. - Schedule blood work for late fall. - Schedule follow-up appointment for late fall.     1. MS (multiple sclerosis) (HCC) (Primary) MS under good control being followed by neurologist gives her a lot of trouble with pain and discomfort she has chronic pain she sees pain management for that  2. Essential hypertension Blood pressure good control today  3. Other hyperlipidemia Has hyperlipidemia check lab work later this year  4. Anxiety Significant mainly anxiety related issues BuSpar twice daily as needed  5. Constipation, unspecified constipation type Has constipation issues Linzess would be reasonable she takes opioids normally has 1 bowel movement every for 5 days which is hard and causes pain and discomfort plus also she has tried MiraLAX  and fiber additives and water  without help should be noted that the patient is on significant amount of opioids which could well be causing all of this  6. Depression, major, single episode, in partial remission (HCC)  Under decent control with medication not suicidal  Lab work before follow-up office visit in approximately 6 months Patient with fasting hyperglycemia would benefit from  A1c

## 2023-11-09 NOTE — Progress Notes (Deleted)
 Office Visit Note  Patient: Natalie Campos             Date of Birth: 07-Jun-1960           MRN: [id number]             PCP: Alphonsa Glendia LABOR, MD Referring: Alphonsa Glendia LABOR, MD Visit Date: 11/23/2023 Occupation: @GUAROCC @  Subjective:  No chief complaint on file.   History of Present Illness: Natalie Campos is a 63 y.o. female ***   DEXA 12/23/22: Radius 33% BMD 0.583 T-score -3.4. RFN -2.6, Left RFN -2.6. This is the patients first DEXA.  Patient was naive to osteoporosis treatment upon presenting for the initial office visit on 03/15/23. History of frequent falls due to underlying MS-using rollator walker to assist with ambulation.  No recent fractures. Previous forearm fractures during childhood. Family history of osteoporosis-paternal grandmother.  She is taking vitamin D  50,000 units once weekly and drinks milk daily.  History of IV and intraarticular steroid use for many years.  History of GERD and Barrett's esophagitis--not a good candidate for oral bisphosphonates.  Different treatment options were discussed in detail at her initial office visit. Patient was started on Tymlos  daily sq injections on 04/02/23.  She has not noticed any side effects or injection site reactions.  She will follow up in 6 months or sooner if needed.   Activities of Daily Living:  Patient reports morning stiffness for *** {minute/hour:19697}.   Patient {ACTIONS;DENIES/REPORTS:21021675::Denies} nocturnal pain.  Difficulty dressing/grooming: {ACTIONS;DENIES/REPORTS:21021675::Denies} Difficulty climbing stairs: {ACTIONS;DENIES/REPORTS:21021675::Denies} Difficulty getting out of chair: {ACTIONS;DENIES/REPORTS:21021675::Denies} Difficulty using hands for taps, buttons, cutlery, and/or writing: {ACTIONS;DENIES/REPORTS:21021675::Denies}  No Rheumatology ROS completed.   PMFS History:  Patient Active Problem List   Diagnosis Date Noted   SOB (shortness of breath) 02/24/2023   MS (multiple  sclerosis) (HCC) 02/23/2023   Constipation 10/21/2021   Status post laparoscopic Nissen fundoplication 03/24/2021   Barrett's esophagus 01/17/2019   Hiatal hernia 08/12/2017   GERD (gastroesophageal reflux disease) 09/11/2015   Anemia 08/01/2013   Hyperlipidemia 05/23/2009   Persistent depressive disorder 05/23/2009   Essential hypertension 05/23/2009   EMPHYSEMA 05/23/2009   Irritable bowel syndrome 05/23/2009   DEGENERATIVE DISC DISEASE 05/23/2009    Past Medical History:  Diagnosis Date   Anemia    Angio-edema    Anxiety    Barrett's esophagus    Bulging discs    Cataract    Complication of anesthesia    Complication of anesthesia    patietn wakes up easily- has anxiety due to waking up during a simple procedure   Depression    DJD (degenerative joint disease)    Dog bite(E906.0) 12/06/2009   Eczema    GERD (gastroesophageal reflux disease)    High triglycerides    Hyperlipidemia    Hypertension    IBS (irritable bowel syndrome)    contipation predominant    Iron deficiency anemia    Multiple sclerosis (HCC) 05/11/1988   Dx in 1990 most recent hospitalization for a flareis in 2011    Obesity    Osteoporosis 02/2023    Family History  Problem Relation Age of Onset   COPD Mother    Alcoholism Mother    Mental illness Mother    Alcoholism Father    Eczema Father    Asthma Sister    Hypertension Sister    Asthma Brother    Asthma Maternal Uncle    Asthma Paternal Uncle    Allergic rhinitis Daughter  Ulcerative colitis Daughter    Asthma Daughter    Healthy Daughter    Irritable bowel syndrome Daughter    Past Surgical History:  Procedure Laterality Date   bilateral cataract surgery      BIOPSY  01/28/2011   Procedure: BIOPSY;  Surgeon: Claudis RAYMOND Rivet, MD;  Location: [facility];  Service: Endoscopy;  Laterality: N/A;   BIOPSY N/A 08/25/2013   Procedure: BIOPSY;  Surgeon: Claudis RAYMOND Rivet, MD;  Location: [facility];  Service: Endoscopy;  Laterality: N/A;    BIOPSY  02/12/2021   Procedure: BIOPSY;  Surgeon: Rivet Claudis RAYMOND, MD;  [facility]: [facility];  Service: Endoscopy;;   CHOLECYSTECTOMY  05/11/2008   Dr. Ivery    COLONOSCOPY WITH PROPOFOL  N/A 08/25/2013   Procedure: COLONOSCOPY WITH PROPOFOL ;  Surgeon: Claudis RAYMOND Rivet, MD;  Location: [facility];  Service: Endoscopy;  Laterality: N/A;  in cecum at 0811 out at 0822 = 11 minutes   ESOPHAGOGASTRODUODENOSCOPY  01/28/2011   Procedure: ESOPHAGOGASTRODUODENOSCOPY (EGD);  Surgeon: Claudis RAYMOND Rivet, MD;  [facility]: [facility];  Service: Endoscopy;  Laterality: N/A;  1:00   ESOPHAGOGASTRODUODENOSCOPY  03/24/2021   Procedure: ESOPHAGOGASTRODUODENOSCOPY (EGD);  Surgeon: Gladis Cough, MD;  Location: [facility];  Service: General;;   ESOPHAGOGASTRODUODENOSCOPY (EGD) WITH PROPOFOL  N/A 08/25/2013   Procedure: ESOPHAGOGASTRODUODENOSCOPY (EGD) WITH PROPOFOL ;  Surgeon: Claudis RAYMOND Rivet, MD;  Location: [facility];  Service: Endoscopy;  Laterality: N/A;   ESOPHAGOGASTRODUODENOSCOPY (EGD) WITH PROPOFOL  N/A 02/12/2021   Procedure: ESOPHAGOGASTRODUODENOSCOPY (EGD) WITH PROPOFOL ;  Surgeon: Rivet Claudis RAYMOND, MD;  [facility];  Service: Endoscopy;  Laterality: N/A;  7:30   ESOPHAGOGASTRODUODENOSCOPY (EGD) WITH PROPOFOL  N/A 03/30/2023   Procedure: ESOPHAGOGASTRODUODENOSCOPY (EGD) WITH PROPOFOL ;  Surgeon: Eartha Angelia Sieving, MD;  [facility]: [facility];  Service: Gastroenterology;  Laterality: N/A;  1:15PM;ASA 3   WISDOM TOOTH EXTRACTION     XI ROBOTIC ASSISTED HIATAL HERNIA REPAIR N/A 03/24/2021   Procedure: XI ROBOTIC ASSISTED HIATAL HERNIA REPAIR WITH FUNDOPLICATION;  Surgeon: Gladis Cough, MD;  Location: [facility];  Service: General;  Laterality: N/A;   Social History   Social History Narrative   Two grown children, still birth infant    Immunization History  Administered Date(s) Administered   Influenza Whole 03/13/2011   Influenza, Seasonal, Injecte, Preservative Fre 02/03/2023    Influenza,inj,Quad PF,6+ Mos 01/24/2019, 03/04/2020, 01/19/2022   Influenza,inj,quad, With Preservative 03/15/2017, 02/28/2018   Influenza-Unspecified 03/13/2015, 02/12/2018, 01/14/2021   Moderna Sars-Covid-2 Vaccination 07/24/2019, 08/28/2019, 05/01/2020, 10/23/2020   Pneumococcal Conjugate-13 03/13/2015   Pneumococcal Polysaccharide-23 02/28/2018   Tdap 10/29/2010   Unspecified SARS-COV-2 Vaccination 02/11/2021   Zoster, Unspecified 02/04/2021, 04/10/2022     Objective: Vital Signs: There were no vitals taken for this visit.   Physical Exam   Musculoskeletal Exam: ***  CDAI Exam: CDAI Score: -- Patient Global: --; Provider Global: -- Swollen: --; Tender: -- Joint Exam 11/23/2023   No joint exam has been documented for this visit   There is currently no information documented on the homunculus. Go to the Rheumatology activity and complete the homunculus joint exam.  Investigation: No additional findings.  Imaging: No results found.  Recent Labs: Lab Results  Component Value Date   WBC 7.1 03/29/2023   HGB 14.3 03/29/2023   PLT 333 03/29/2023   NA 141 07/07/2023   K 4.1 07/07/2023   CL 99 07/07/2023   CO2 25 07/07/2023   GLUCOSE 116 (H) 07/07/2023   BUN 13 07/07/2023   CREATININE 0.78 07/07/2023  BILITOT 0.3 07/07/2023   ALKPHOS 200 (H) 07/07/2023   AST 20 07/07/2023   ALT 19 07/07/2023   PROT 7.4 07/07/2023   ALBUMIN 4.4 07/07/2023   CALCIUM 10.0 07/07/2023   GFRAA 77 03/04/2020    Speciality Comments: No specialty comments available.  Procedures:  No procedures performed Allergies: Ace inhibitors, Atenolol, Losartan , and Penicillins   Assessment / Plan:     Visit Diagnoses: Localized osteoporosis without current pathological fracture  Current chronic use of systemic steroids  Medication monitoring encounter  Vitamin D  deficiency  Primary osteoarthritis of left knee  Degeneration of intervertebral disc of lumbosacral region without  discogenic pain or lower extremity pain  Lumbar nerve root impingement  Chronic pain of left ankle  Essential hypertension  Hiatal hernia  Barrett's esophagus without dysplasia  History of gastroesophageal reflux (GERD)  History of IBS  MS (multiple sclerosis) (HCC)  Other hyperlipidemia  Status post laparoscopic Nissen fundoplication  Orders: No orders of the defined types were placed in this encounter.  No orders of the defined types were placed in this encounter.   Face-to-face time spent with patient was *** minutes. Greater than 50% of time was spent in counseling and coordination of care.  Follow-Up Instructions: No follow-ups on file.   Waddell CHRISTELLA Craze, PA-C  Note - This record has been created using Dragon software.  Chart creation errors have been sought, but may not always  have been located. Such creation errors do not reflect on  the standard of medical care.

## 2023-11-13 ENCOUNTER — Other Ambulatory Visit: Payer: Self-pay | Admitting: Neurology

## 2023-11-14 ENCOUNTER — Encounter: Payer: Self-pay | Admitting: Family Medicine

## 2023-11-15 ENCOUNTER — Other Ambulatory Visit: Payer: Self-pay | Admitting: Family Medicine

## 2023-11-15 MED ORDER — BUSPIRONE HCL 10 MG PO TABS: 10.0000 mg | ORAL_TABLET | Freq: Three times a day (TID) | ORAL | 5 refills | Status: DC

## 2023-11-22 ENCOUNTER — Other Ambulatory Visit: Payer: Self-pay | Admitting: Family Medicine

## 2023-11-22 NOTE — Telephone Encounter (Signed)
 Please verify with the patient if she is still taking Abilify .  If she is you may give her 4 refills

## 2023-11-23 ENCOUNTER — Ambulatory Visit: Payer: Medicare Other | Admitting: Rheumatology

## 2023-11-23 DIAGNOSIS — M5416 Radiculopathy, lumbar region: Secondary | ICD-10-CM

## 2023-11-23 DIAGNOSIS — M816 Localized osteoporosis [Lequesne]: Secondary | ICD-10-CM

## 2023-11-23 DIAGNOSIS — Z9889 Other specified postprocedural states: Secondary | ICD-10-CM

## 2023-11-23 DIAGNOSIS — M1712 Unilateral primary osteoarthritis, left knee: Secondary | ICD-10-CM

## 2023-11-23 DIAGNOSIS — E559 Vitamin D deficiency, unspecified: Secondary | ICD-10-CM

## 2023-11-23 DIAGNOSIS — I10 Essential (primary) hypertension: Secondary | ICD-10-CM

## 2023-11-23 DIAGNOSIS — K2270 Barrett's esophagus without dysplasia: Secondary | ICD-10-CM

## 2023-11-23 DIAGNOSIS — Z7952 Long term (current) use of systemic steroids: Secondary | ICD-10-CM

## 2023-11-23 DIAGNOSIS — G8929 Other chronic pain: Secondary | ICD-10-CM

## 2023-11-23 DIAGNOSIS — G35 Multiple sclerosis: Secondary | ICD-10-CM

## 2023-11-23 DIAGNOSIS — Z5181 Encounter for therapeutic drug level monitoring: Secondary | ICD-10-CM

## 2023-11-23 DIAGNOSIS — K449 Diaphragmatic hernia without obstruction or gangrene: Secondary | ICD-10-CM

## 2023-11-23 DIAGNOSIS — M51379 Other intervertebral disc degeneration, lumbosacral region without mention of lumbar back pain or lower extremity pain: Secondary | ICD-10-CM

## 2023-11-23 DIAGNOSIS — Z8719 Personal history of other diseases of the digestive system: Secondary | ICD-10-CM

## 2023-11-23 DIAGNOSIS — E7849 Other hyperlipidemia: Secondary | ICD-10-CM

## 2023-11-23 NOTE — Telephone Encounter (Signed)
 Please see previous message

## 2023-12-03 ENCOUNTER — Ambulatory Visit

## 2023-12-03 VITALS — Ht 69.0 in | Wt 233.0 lb

## 2023-12-03 DIAGNOSIS — Z1211 Encounter for screening for malignant neoplasm of colon: Secondary | ICD-10-CM

## 2023-12-03 DIAGNOSIS — Z0000 Encounter for general adult medical examination without abnormal findings: Secondary | ICD-10-CM | POA: Diagnosis not present

## 2023-12-03 NOTE — Patient Instructions (Signed)
 Natalie Campos , Thank you for taking time out of your busy schedule to complete your Annual Wellness Visit with me. I enjoyed our conversation and look forward to speaking with you again next year. I, as well as your care team,  appreciate your ongoing commitment to your health goals. Please review the following plan we discussed and let me know if I can assist you in the future. Your Game plan/ To Do List    Follow up Visits: Next Medicare AWV with our clinical staff: In 1 year    Have you seen your provider in the last 6 months (3 months if uncontrolled diabetes)? Yes Next Office Visit with your provider: 04/20/24 @ 1:30  Clinician Recommendations:  Aim for 30 minutes of exercise or brisk walking, 6-8 glasses of water , and 5 servings of fruits and vegetables each day.       This is a list of the screening recommended for you and due dates:  Health Maintenance  Topic Date Due   HIV Screening  Never done   Zoster (Shingles) Vaccine (1 of 2) 02/12/1978   COVID-19 Vaccine (6 - 2024-25 season) 01/10/2023   Pneumococcal Vaccination (3 of 3 - PCV20 or PCV21) 03/01/2023   Colon Cancer Screening  08/26/2023   Flu Shot  12/10/2023   Pap with HPV screening  02/18/2024   Medicare Annual Wellness Visit  12/02/2024   Mammogram  10/13/2025   DTaP/Tdap/Td vaccine (3 - Td or Tdap) 02/17/2033   Hepatitis C Screening  Completed   Hepatitis B Vaccine  Aged Out   HPV Vaccine  Aged Out   Meningitis B Vaccine  Aged Out    Advanced directives: (ACP Link)Information on Advanced Care Planning can be found at [employer]  Print production planner Health Care Directives Advance Health Care Directives. [email or web address]   Advance Care Planning is important because it:  [x]  Makes sure you receive the medical care that is consistent with your values, goals, and preferences  [x]  It provides guidance to your family and loved ones and reduces their decisional burden about whether or not they are making the right  decisions based on your wishes.  Follow the link provided in your after visit summary or read over the paperwork we have mailed to you to help you started getting your Advance Directives in place. If you need assistance in completing these, please reach out to us  so that we can help you!  See attachments for Preventive Care and Fall Prevention Tips.

## 2023-12-03 NOTE — Progress Notes (Signed)
 Subjective:   Natalie Campos is a 63 y.o. who presents for a Medicare Wellness preventive visit.  As a reminder, Annual Wellness Visits don't include a physical exam, and some assessments may be limited, especially if this visit is performed virtually. We may recommend an in-person follow-up visit with your provider if needed.  Visit Complete: Virtual I connected with  Natalie Campos on 12/03/23 by a audio enabled telemedicine application and verified that I am speaking with the correct person using two identifiers.  Patient Location: Home  Provider Location: Home Office  I discussed the limitations of evaluation and management by telemedicine. The patient expressed understanding and agreed to proceed.  Vital Signs: Because this visit was a virtual/telehealth visit, some criteria may be missing or patient reported. Any vitals not documented were not able to be obtained and vitals that have been documented are patient reported.  VideoDeclined- This patient declined Librarian, academic. Therefore the visit was completed with audio only.  Persons Participating in Visit: Patient.  AWV Questionnaire: Yes: Patient Medicare AWV questionnaire was completed by the patient on 11/29/23; I have confirmed that all information answered by patient is correct and no changes since this date.  Cardiac Risk Factors include: dyslipidemia;hypertension     Objective:    Today's Vitals   12/03/23 1513  Weight: 233 lb (105.7 kg)  Height: 5' 9 (1.753 m)   Body mass index is 34.41 kg/m.     12/03/2023    3:25 PM 03/30/2023   12:17 PM 03/30/2023   11:58 AM 03/29/2023   11:55 AM 03/24/2021    1:00 PM 03/24/2021    5:41 AM 03/21/2021    9:07 AM  Advanced Directives  Does Patient Have a Medical Advance Directive? No Yes No Yes Yes Yes Yes  Type of Agricultural consultant;Living will Healthcare Power of [place];Living will Healthcare Power of  [place];Living will  Does patient want to make changes to medical advance directive?     No - Patient declined    Copy of Healthcare Power of Attorney in Chart?     No - copy requested No - copy requested   Would patient like information on creating a medical advance directive? Yes (MAU/Ambulatory/Procedural Areas - Information given) No - Patient declined         Current Medications (verified) Outpatient Encounter Medications as of 12/03/2023  Medication Sig   amLODipine  (NORVASC ) 5 MG tablet TAKE 1 TABLET BY MOUTH DAILY   ARIPiprazole  (ABILIFY ) 5 MG tablet TAKE 1 TABLET BY MOUTH AT BEDTIME FOR DEPRESSION   baclofen  (LIORESAL ) 10 MG tablet One po qid   busPIRone  (BUSPAR ) 10 MG tablet Take 1 tablet (10 mg total) by mouth 3 (three) times daily.   clobetasol cream (TEMOVATE) 0.05 % Apply 1 Application topically as needed.   docusate sodium (COLACE) 100 MG capsule Take 100 mg by mouth 2 (two) times daily.   DULoxetine  (CYMBALTA ) 60 MG capsule Take 60 mg by mouth daily.    ferrous sulfate  325 (65 FE) MG EC tablet Take 325 mg by mouth once a week.   fish oil-omega-3 fatty acids 1000 MG capsule Take 1 g by mouth daily.   fluticasone  (FLONASE ) 50 MCG/ACT nasal spray Place 2 sprays into both nostrils daily.   gabapentin  (NEURONTIN ) 600 MG tablet TAKE 1 TABLET BY MOUTH 3 TIMES  DAILY   HYDROmorphone  (DILAUDID ) 2 MG tablet Take 4 mg by mouth 3 (three) times  daily.   HYDROmorphone  HCl (EXALGO ) 8 MG TB24 Take 8 mg by mouth daily.   indapamide  (LOZOL ) 1.25 MG tablet Take 1 tablet (1.25 mg total) by mouth daily.   Insulin  Pen Needle 31G X 5 MM MISC Use to inject Tymlos  once daily. DO NOT REUSE. Sent via fax to [facility]. Future refills to [facility]   lubiprostone  (AMITIZA ) 8 MCG capsule Take 1 capsule (8 mcg total) by mouth 2 (two) times daily with a meal.   modafinil  (PROVIGIL ) 200 MG tablet TAKE 1 TABLET BY MOUTH IN THE MORNING AND 1 TABLET AT NOON   multivitamin (THERAGRAN) per tablet Take  1 tablet by mouth daily.   naloxone  (NARCAN ) nasal spray 4 mg/0.1 mL Use as directed for opioid overdose   olopatadine  (PATANOL) 0.1 % ophthalmic solution Place 1 drop into both eyes 2 (two) times daily.   polyethylene glycol powder (GLYCOLAX /MIRALAX ) 17 GM/SCOOP powder MIX 25.5 GRAMS WITH WATER  OR JUICE ONCE DAILY. (Patient taking differently: Take 25.5 g by mouth daily. MIX 25.5 GRAMS WITH WATER  OR JUICE ONCE DAILY.)   potassium chloride  (KLOR-CON ) 10 MEQ tablet TAKE 1 TABLET BY MOUTH TWICE  DAILY   RABEprazole  (ACIPHEX ) 20 MG tablet Take 1 tablet by mouth twice daily   tamsulosin  (FLOMAX ) 0.4 MG CAPS capsule Take 1 capsule by mouth once daily   triamcinolone  cream (KENALOG ) 0.1 % Apply 1 Application topically 2 (two) times daily. Prn rash; use up to 2 weeks   Vitamin D , Ergocalciferol , (DRISDOL) 1.25 MG (50000 UNIT) CAPS capsule Take 50,000 Units by mouth every 7 (seven) days.   Abaloparatide  (TYMLOS ) 3120 MCG/1.56ML SOPN Inject 80 mcg into the skin at bedtime.   No facility-administered encounter medications on file as of 12/03/2023.    Allergies (verified) Ace inhibitors, Atenolol, Losartan , and Penicillins   History: Past Medical History:  Diagnosis Date   Anemia    Angio-edema    Anxiety    Barrett's esophagus    Bulging discs    Cataract    Complication of anesthesia    Complication of anesthesia    patietn wakes up easily- has anxiety due to waking up during a simple procedure   Depression    DJD (degenerative joint disease)    Dog bite(E906.0) 12/06/2009   Eczema    GERD (gastroesophageal reflux disease)    High triglycerides    Hyperlipidemia    Hypertension    IBS (irritable bowel syndrome)    contipation predominant    Iron deficiency anemia    Multiple sclerosis (HCC) 05/11/1988   Dx in 1990 most recent hospitalization for a flareis in 2011    Obesity    Osteoporosis 02/2023   Past Surgical History:  Procedure Laterality Date   bilateral cataract surgery       BIOPSY  01/28/2011   Procedure: BIOPSY;  Surgeon: Natalie RAYMOND Rivet, MD;  [facility];  Service: Endoscopy;  Laterality: N/A;   BIOPSY N/A 08/25/2013   Procedure: BIOPSY;  Surgeon: Natalie RAYMOND Rivet, MD;  Location: [facility];  Service: Endoscopy;  Laterality: N/A;   BIOPSY  02/12/2021   Procedure: BIOPSY;  Surgeon: Campos Natalie RAYMOND, MD;  [facility];  Service: Endoscopy;;   CHOLECYSTECTOMY  05/11/2008   Dr. Ivery    COLONOSCOPY WITH PROPOFOL  N/A 08/25/2013   Procedure: COLONOSCOPY WITH PROPOFOL ;  Surgeon: Natalie RAYMOND Rivet, MD;  Location: [facility];  Service: Endoscopy;  Laterality: N/A;  in cecum at 0811 out at 0822 = 11 minutes  ESOPHAGOGASTRODUODENOSCOPY  01/28/2011   Procedure: ESOPHAGOGASTRODUODENOSCOPY (EGD);  Surgeon: Natalie RAYMOND Rivet, MD;  [facility];  Service: Endoscopy;  Laterality: N/A;  1:00   ESOPHAGOGASTRODUODENOSCOPY  03/24/2021   Procedure: ESOPHAGOGASTRODUODENOSCOPY (EGD);  Surgeon: Gladis Cough, MD;  Location: [facility];  Service: General;;   ESOPHAGOGASTRODUODENOSCOPY (EGD) WITH PROPOFOL  N/A 08/25/2013   Procedure: ESOPHAGOGASTRODUODENOSCOPY (EGD) WITH PROPOFOL ;  Surgeon: Natalie RAYMOND Rivet, MD;  Location: [facility];  Service: Endoscopy;  Laterality: N/A;   ESOPHAGOGASTRODUODENOSCOPY (EGD) WITH PROPOFOL  N/A 02/12/2021   Procedure: ESOPHAGOGASTRODUODENOSCOPY (EGD) WITH PROPOFOL ;  Surgeon: Campos Natalie RAYMOND, MD;  [facility];  Service: Endoscopy;  Laterality: N/A;  7:30   ESOPHAGOGASTRODUODENOSCOPY (EGD) WITH PROPOFOL  N/A 03/30/2023   Procedure: ESOPHAGOGASTRODUODENOSCOPY (EGD) WITH PROPOFOL ;  Surgeon: Eartha Angelia Sieving, MD;  [facility];  Service: Gastroenterology;  Laterality: N/A;  1:15PM;ASA 3   WISDOM TOOTH EXTRACTION     XI ROBOTIC ASSISTED HIATAL HERNIA REPAIR N/A 03/24/2021   Procedure: XI ROBOTIC ASSISTED HIATAL HERNIA REPAIR WITH FUNDOPLICATION;  Surgeon: Gladis Cough, MD;  Location: [facility];  Service: General;   Laterality: N/A;   Family History  Problem Relation Age of Onset   COPD Mother    Alcoholism Mother    Mental illness Mother    Alcoholism Father    Eczema Father    Asthma Sister    Hypertension Sister    Asthma Brother    Asthma Maternal Uncle    Asthma Paternal Uncle    Allergic rhinitis Daughter    Ulcerative colitis Daughter    Asthma Daughter    Healthy Daughter    Irritable bowel syndrome Daughter    Social History   Socioeconomic History   Marital status: Married    Spouse name: Not on file   Number of children: 3   Years of education: Not on file   Highest education level: GED or equivalent  Occupational History   Occupation: disabled since 2002  Tobacco Use   Smoking status: Former    Current packs/day: 0.00    Average packs/day: 1.5 packs/day for 30.0 years (45.0 ttl pk-yrs)    Types: Cigarettes    Start date: 03/08/1978    Quit date: 03/08/2008    Years since quitting: 15.7    Passive exposure: Past   Smokeless tobacco: Never  Vaping Use   Vaping status: Never Used  Substance and Sexual Activity   Alcohol use: No    Alcohol/week: 0.0 standard drinks of alcohol   Drug use: No   Sexual activity: Yes    Birth control/protection: Post-menopausal  Other Topics Concern   Not on file  Social History Narrative   Two grown children, still birth infant    Social Drivers of Corporate investment banker Strain: Low Risk  (11/29/2023)   Overall Financial Resource Strain (CARDIA)    Difficulty of Paying Living Expenses: Not very hard  Food Insecurity: No Food Insecurity (11/29/2023)   Hunger Vital Sign    Worried About Running Out of Food in the Last Year: Never true    Ran Out of Food in the Last Year: Never true  Transportation Needs: No Transportation Needs (12/03/2023)   PRAPARE - Administrator, Civil Service (Medical): No    Lack of Transportation (Non-Medical): No  Physical Activity: Inactive (11/29/2023)   Exercise Vital Sign    Days of  Exercise per Week: 0 days    Minutes of Exercise per Session: 0 min  Stress: Stress  Concern Present (11/29/2023)   [employer] of Occupational Health - Occupational Stress Questionnaire    Feeling of Stress: To some extent  Social Connections: Moderately Isolated (11/29/2023)   Social Connection and Isolation Panel    Frequency of Communication with Friends and Family: Once a week    Frequency of Social Gatherings with Friends and Family: Once a week    Attends Religious Services: More than 4 times per year    Active Member of [employer] or Organizations: No    Attends Banker Meetings: Never    Marital Status: Married    Tobacco Counseling Counseling given: Not Answered    Clinical Intake:  Pre-visit preparation completed: Yes  Pain : No/denies pain  Diabetes: No  Lab Results  Component Value Date   HGBA1C 5.4 07/07/2023   HGBA1C 5.2 07/06/2014   HGBA1C 5.4 05/23/2009     How often do you need to have someone help you when you read instructions, pamphlets, or other written materials from your doctor or pharmacy?: 1 - Never  Interpreter Needed?: No  Information entered by :: Charmaine Bloodgood LPN   Activities of Daily Living     11/29/2023   11:05 AM 03/29/2023   11:55 AM  In your present state of health, do you have any difficulty performing the following activities:  Hearing? 0 0  Vision? 0 0  Difficulty concentrating or making decisions? 0 0  Walking or climbing stairs? 1   Dressing or bathing? 0   Doing errands, shopping? 1   Preparing Food and eating ? N   Using the Toilet? Y   In the past six months, have you accidently leaked urine? Y   Do you have problems with loss of bowel control? N   Managing your Medications? Y   Managing your Finances? Y   Housekeeping or managing your Housekeeping? Y     Patient Care Team: Alphonsa Glendia LABOR, MD as PCP - General (Family Medicine) Sater, Charlie LABOR, MD (Neurology) LABOR Davie Clarity, NP as Nurse  Practitioner (Gynecology)  I have updated your Care Teams any recent Medical Services you may have received from other providers in the past year.     Assessment:   This is a routine wellness examination for Natalie Campos.  Hearing/Vision screen Hearing Screening - Comments:: Denies hearing difficulties   Vision Screening - Comments:: Wears rx glasses - up to date with routine eye exams   Goals Addressed             This Visit's Progress    Prevent falls   On track      Depression Screen     12/03/2023    3:23 PM 10/20/2023    4:35 PM 07/29/2023    3:21 PM 06/04/2023   10:46 AM 02/03/2023   12:07 PM 12/09/2022    9:21 AM 07/31/2022   10:26 AM  PHQ 2/9 Scores  PHQ - 2 Score 0 0 0 1 3 0 0  PHQ- 9 Score 3 6 10 8 14 6 3     Fall Risk     11/29/2023   11:05 AM 10/20/2023    4:35 PM 07/29/2023    3:21 PM 02/03/2023   12:04 PM 07/31/2022   10:25 AM  Fall Risk   Falls in the past year? 0 0 1 1 0  Number falls in past yr: 0  1 1   Injury with Fall? 0   0   Risk for fall due to : Impaired  mobility    Impaired balance/gait  Follow up Falls prevention discussed;Education provided;Falls evaluation completed    Falls evaluation completed    MEDICARE RISK AT HOME:  Medicare Risk at Home Any stairs in or around the home?: (Patient-Rptd) Yes If so, are there any without handrails?: (Patient-Rptd) No Home free of loose throw rugs in walkways, pet beds, electrical cords, etc?: (Patient-Rptd) Yes Adequate lighting in your home to reduce risk of falls?: (Patient-Rptd) Yes Life alert?: (Patient-Rptd) No Use of a cane, walker or w/c?: (Patient-Rptd) Yes Grab bars in the bathroom?: (Patient-Rptd) Yes Shower chair or bench in shower?: (Patient-Rptd) Yes Elevated toilet seat or a handicapped toilet?: (Patient-Rptd) Yes  TIMED UP AND GO:  Was the test performed?  No  Cognitive Function: Declined/Normal: No cognitive concerns noted by patient or family. Patient alert, oriented, able to answer  questions appropriately and recall recent events. No signs of memory loss or confusion.        Immunizations Immunization History  Administered Date(s) Administered   Influenza Whole 03/13/2011   Influenza, Seasonal, Injecte, Preservative Fre 02/03/2023   Influenza,inj,Quad PF,6+ Mos 01/24/2019, 03/04/2020, 01/19/2022   Influenza,inj,quad, With Preservative 03/15/2017, 02/28/2018   Influenza-Unspecified 03/13/2015, 02/12/2018, 01/14/2021   Moderna Sars-Covid-2 Vaccination 07/24/2019, 08/28/2019, 05/01/2020, 10/23/2020   Pneumococcal Conjugate-13 03/13/2015   Pneumococcal Polysaccharide-23 02/28/2018   Tdap 10/29/2010, 02/18/2023   Unspecified SARS-COV-2 Vaccination 02/11/2021   Zoster, Unspecified 02/04/2021, 04/10/2022    Screening Tests Health Maintenance  Topic Date Due   HIV Screening  Never done   Zoster Vaccines- Shingrix (1 of 2) 02/12/1978   COVID-19 Vaccine (6 - 2024-25 season) 01/10/2023   Pneumococcal Vaccine 66-63 Years old (3 of 3 - PCV20 or PCV21) 03/01/2023   Colonoscopy  08/26/2023   INFLUENZA VACCINE  12/10/2023   Cervical Cancer Screening (HPV/Pap Cotest)  02/18/2024   Medicare Annual Wellness (AWV)  12/02/2024   MAMMOGRAM  10/13/2025   DTaP/Tdap/Td (3 - Td or Tdap) 02/17/2033   Hepatitis C Screening  Completed   Hepatitis B Vaccines  Aged Out   HPV VACCINES  Aged Out   Meningococcal B Vaccine  Aged Out    Health Maintenance  Health Maintenance Due  Topic Date Due   HIV Screening  Never done   Zoster Vaccines- Shingrix (1 of 2) 02/12/1978   COVID-19 Vaccine (6 - 2024-25 season) 01/10/2023   Pneumococcal Vaccine 21-49 Years old (3 of 3 - PCV20 or PCV21) 03/01/2023   Colonoscopy  08/26/2023   Health Maintenance Items Addressed: Referral sent to GI for colonoscopy  Additional Screening:  Vision Screening: Recommended annual ophthalmology exams for early detection of glaucoma and other disorders of the eye. Would you like a referral to an eye  doctor? No    Dental Screening: Recommended annual dental exams for proper oral hygiene  Community Resource Referral / Chronic Care Management: CRR required this visit?  No   CCM required this visit?  No   Plan:    I have personally reviewed and noted the following in the patient's chart:   Medical and social history Use of alcohol, tobacco or illicit drugs  Current medications and supplements including opioid prescriptions. Patient is currently taking opioid prescriptions. Information provided to patient regarding non-opioid alternatives. Patient advised to discuss non-opioid treatment plan with their provider. Functional ability and status Nutritional status Physical activity Advanced directives List of other physicians Hospitalizations, surgeries, and ER visits in previous 12 months Vitals Screenings to include cognitive, depression, and falls Referrals and appointments  In addition, I have reviewed and discussed with patient certain preventive protocols, quality metrics, and best practice recommendations. A written personalized care plan for preventive services as well as general preventive health recommendations were provided to patient.   Lavelle Pfeiffer [place], [place]   2/74/7974   After Visit Summary: (MyChart) Due to this being a telephonic visit, the after visit summary with patients personalized plan was offered to patient via MyChart   Notes: Nothing significant to report at this time.

## 2023-12-07 ENCOUNTER — Encounter (INDEPENDENT_AMBULATORY_CARE_PROVIDER_SITE_OTHER): Payer: Self-pay | Admitting: *Deleted

## 2023-12-14 ENCOUNTER — Other Ambulatory Visit: Payer: Self-pay | Admitting: Family Medicine

## 2023-12-14 DIAGNOSIS — E876 Hypokalemia: Secondary | ICD-10-CM

## 2023-12-29 ENCOUNTER — Telehealth: Payer: Self-pay

## 2023-12-29 NOTE — Telephone Encounter (Signed)
 Who is your primary care physician: Glendia fielding  Reasons for the colonoscopy: screening  Have you had a colonoscopy before?  yes  Do you have family history of colon cancer? no  Previous colonoscopy with polyps removed? no  Do you have a history colorectal cancer?   no  Are you diabetic? If yes, Type 1 or Type 2?    no  Do you have a prosthetic or mechanical heart valve? no  Do you have a pacemaker/defibrillator?   no  Have you had endocarditis/atrial fibrillation? no  Have you had joint replacement within the last 12 months?  no  Do you tend to be constipated or have to use laxatives? yes  Do you have any history of drugs or alchohol?  no  Do you use supplemental oxygen?  no  Have you had a stroke or heart attack within the last 6 months? no  Do you take weight loss medication?  no  For female patients: have you had a hysterectomy?  no                                     are you post menopausal?       no                                            do you still have your menstrual cycle? no      Do you take any blood-thinning medications such as: (aspirin, warfarin, Plavix, Aggrenox)  no  If yes we need the name, milligram, dosage and who is prescribing doctor  Current Outpatient Medications on File Prior to Visit  Medication Sig Dispense Refill   amLODipine  (NORVASC ) 5 MG tablet Take 5 mg by mouth daily.     ARIPiprazole  (ABILIFY ) 5 MG tablet Take 5 mg by mouth daily.     baclofen  (LIORESAL ) 10 MG tablet Take 10 mg by mouth 3 (three) times daily.     busPIRone  (BUSPAR ) 10 MG tablet Take 10 mg by mouth 3 (three) times daily.     clobetasol cream (TEMOVATE) 0.05 % Apply 1 Application topically 2 (two) times daily.     docusate sodium (COLACE) 100 MG capsule Take 100 mg by mouth 2 (two) times daily.     DULoxetine  (CYMBALTA ) 60 MG capsule Take 60 mg by mouth daily.     ferrous sulfate  325 (65 FE) MG tablet Take 325 mg by mouth daily with breakfast.     fluticasone   (FLONASE ) 50 MCG/ACT nasal spray Place 2 sprays into both nostrils daily.     gabapentin  (NEURONTIN ) 600 MG tablet Take 600 mg by mouth 3 (three) times daily.     HYDROmorphone  (DILAUDID ) 4 MG tablet Take 4 mg by mouth every 4 (four) hours as needed for severe pain (pain score 7-10).     INDAPAMIDE  PO Take by mouth.     Lubiprostone  (AMITIZA  PO) Take 1 tablet by mouth in the morning and at bedtime.     modafinil  (PROVIGIL ) 200 MG tablet Take 200 mg by mouth daily.     Multiple Vitamin (MULTIVITAMIN ADULT PO) Take 1 tablet by mouth daily at 12 noon.     naloxone  (NARCAN ) nasal spray 4 mg/0.1 mL Place 1 spray into the nose.     omega-3 acid ethyl esters (  LOVAZA) 1 g capsule Take 1 g by mouth 2 (two) times daily.     polyethylene glycol (MIRALAX  / GLYCOLAX ) 17 g packet Take 17 g by mouth daily.     POTASSIUM CHLORIDE  PO Take 1 tablet by mouth in the morning and at bedtime.     RABEprazole  (ACIPHEX ) 20 MG tablet Take 20 mg by mouth daily.     tamsulosin  (FLOMAX ) 0.4 MG CAPS capsule Take 0.4 mg by mouth daily.     Vitamin D , Ergocalciferol , (DRISDOL) 1.25 MG (50000 UNIT) CAPS capsule Take 50,000 Units by mouth every 7 (seven) days.     No current facility-administered medications on file prior to visit.    Allergies  Allergen Reactions   Ace Inhibitors Anaphylaxis   Atenolol Anaphylaxis   Losartan  Swelling    Severe tongue swelling   Penicillins     Childhood Allergy  - caused coma  Has patient had a PCN reaction causing immediate rash, facial/tongue/throat swelling, SOB or lightheadedness with hypotension: No Has patient had a PCN reaction causing severe rash involving mucus membranes or skin necrosis: No Has patient had a PCN reaction that required hospitalization: No Has patient had a PCN reaction occurring within the last 10 years: No If all of the above answers are NO, then may proceed with Cephalosporin use.      Pharmacy: [facility]  Primary Insurance Name: [facility] [place]  Best  number where you can be reached: [phone]

## 2023-12-29 NOTE — Telephone Encounter (Signed)
 2-day prep Thanks

## 2023-12-29 NOTE — Telephone Encounter (Signed)
 Room 1 Thanks

## 2023-12-29 NOTE — Telephone Encounter (Signed)
 Who is your primary care physician: Dr.Scott Luking  Reasons for the colonoscopy: screening  Have you had a colonoscopy before?  yes  Do you have family history of colon cancer? no  Previous colonoscopy with polyps removed? no  Do you have a history colorectal cancer?   no  Are you diabetic? If yes, Type 1 or Type 2?    no  Do you have a prosthetic or mechanical heart valve? no  Do you have a pacemaker/defibrillator?   no  Have you had endocarditis/atrial fibrillation? no  Have you had joint replacement within the last 12 months?  no  Do you tend to be constipated or have to use laxatives? yes  Do you have any history of drugs or alchohol?  no  Do you use supplemental oxygen?  no  Have you had a stroke or heart attack within the last 6 months? no  Do you take weight loss medication?  no  For female patients: have you had a hysterectomy?  no                                     are you post menopausal?       no                                            do you still have your menstrual cycle? no      Do you take any blood-thinning medications such as: (aspirin, warfarin, Plavix, Aggrenox)  no  If yes we need the name, milligram, dosage and who is prescribing doctor  No current outpatient medications on file prior to visit.   No current facility-administered medications on file prior to visit.    Allergies  Allergen Reactions   Ace Inhibitors Anaphylaxis   Atenolol Anaphylaxis   Losartan  Swelling    Severe tongue swelling   Penicillins     Childhood Allergy  - caused coma  Has patient had a PCN reaction causing immediate rash, facial/tongue/throat swelling, SOB or lightheadedness with hypotension: No Has patient had a PCN reaction causing severe rash involving mucus membranes or skin necrosis: No Has patient had a PCN reaction that required hospitalization: No Has patient had a PCN reaction occurring within the last 10 years: No If all of the above answers are NO,  then may proceed with Cephalosporin use.      [facility]  Primary Insurance Name: [facility] [id number]  Best number where you can be reached: [phone]

## 2023-12-29 NOTE — Telephone Encounter (Signed)
 triage

## 2023-12-30 ENCOUNTER — Other Ambulatory Visit: Payer: Self-pay | Admitting: *Deleted

## 2023-12-30 ENCOUNTER — Encounter: Payer: Self-pay | Admitting: *Deleted

## 2023-12-30 MED ORDER — PEG 3350-KCL-NA BICARB-NACL 420 G PO SOLR: 4000.0000 mL | Freq: Once | ORAL | 0 refills | Status: AC

## 2023-12-30 NOTE — Telephone Encounter (Signed)
 LMOVM to return call.

## 2023-12-30 NOTE — Telephone Encounter (Signed)
 Pt has been scheduled for 01/21/24. Instructions sent via mychart and prep sent to pharmacy.

## 2023-12-31 ENCOUNTER — Encounter (INDEPENDENT_AMBULATORY_CARE_PROVIDER_SITE_OTHER): Payer: Self-pay | Admitting: *Deleted

## 2023-12-31 NOTE — Telephone Encounter (Signed)
 Referral completed, TCS apt letter sent to PCP

## 2024-01-11 ENCOUNTER — Encounter: Payer: Self-pay | Admitting: *Deleted

## 2024-01-11 NOTE — Telephone Encounter (Signed)
 LMTRC   Pt left vm regarding upcoming procedure.

## 2024-01-11 NOTE — Telephone Encounter (Signed)
 Pt wanted to know if she had to do the big jug along with doing the miralax . Advised pt that provider wanted her to do 2 day prep so she will do the miralax  and trilyte 

## 2024-01-17 NOTE — Telephone Encounter (Signed)
 Pt informed of providers message and recommendations. Pt verbalized understanding.    Message Received: Today Natalie Campos, Toribio, MD  [name], LPN Should be ok to proceed unless she feels worsening shortness of breath. She should keep us  posted and notify us  during the week if so - then we would need to reschedule. Thanks       Previous Messages    ----- Message ----- From: [name], LPN Sent: 0/05/7972  10:03 AM EDT To: [name], MD  Pt says she has a cough, sore throat, ear ache and headache. No fever. She went to urgent care on Friday was given cough medication, ear drops and told to do mucinex. Was told had upper respiratory infection and ear infection. Her procedure is on Friday. She wants to know if she is ok to go ahead with procedure. ----- Message ----- From: [name] Sent: 01/17/2024   9:48 AM EDT To: Madelin CROME Gaylene, LPN  This patient LM that she has an upper respiratory infection and wants to know what she should do about her procedure on Friday.

## 2024-01-21 ENCOUNTER — Ambulatory Visit (HOSPITAL_COMMUNITY): Admitting: Anesthesiology

## 2024-01-21 ENCOUNTER — Other Ambulatory Visit: Payer: Self-pay

## 2024-01-21 ENCOUNTER — Encounter (HOSPITAL_COMMUNITY): Payer: Self-pay | Admitting: Gastroenterology

## 2024-01-21 ENCOUNTER — Encounter (HOSPITAL_COMMUNITY)
Admission: RE | Disposition: A | Discharge status: Home / Self Care | Payer: Self-pay | Source: Home / Self Care | Attending: Gastroenterology

## 2024-01-21 ENCOUNTER — Ambulatory Visit (HOSPITAL_COMMUNITY)
Admission: RE | Admit: 2024-01-21 | Discharge: 2024-01-21 | Disposition: A | Discharge status: Home / Self Care | Attending: Gastroenterology | Admitting: Gastroenterology

## 2024-01-21 DIAGNOSIS — F32A Depression, unspecified: Secondary | ICD-10-CM | POA: Insufficient documentation

## 2024-01-21 DIAGNOSIS — K219 Gastro-esophageal reflux disease without esophagitis: Secondary | ICD-10-CM | POA: Insufficient documentation

## 2024-01-21 DIAGNOSIS — D122 Benign neoplasm of ascending colon: Secondary | ICD-10-CM | POA: Diagnosis not present

## 2024-01-21 DIAGNOSIS — Z1211 Encounter for screening for malignant neoplasm of colon: Secondary | ICD-10-CM | POA: Insufficient documentation

## 2024-01-21 DIAGNOSIS — I10 Essential (primary) hypertension: Secondary | ICD-10-CM | POA: Diagnosis not present

## 2024-01-21 DIAGNOSIS — Z87891 Personal history of nicotine dependence: Secondary | ICD-10-CM

## 2024-01-21 DIAGNOSIS — J449 Chronic obstructive pulmonary disease, unspecified: Secondary | ICD-10-CM | POA: Insufficient documentation

## 2024-01-21 DIAGNOSIS — K648 Other hemorrhoids: Secondary | ICD-10-CM | POA: Diagnosis not present

## 2024-01-21 DIAGNOSIS — K449 Diaphragmatic hernia without obstruction or gangrene: Secondary | ICD-10-CM | POA: Diagnosis not present

## 2024-01-21 DIAGNOSIS — K2270 Barrett's esophagus without dysplasia: Secondary | ICD-10-CM | POA: Diagnosis not present

## 2024-01-21 DIAGNOSIS — K581 Irritable bowel syndrome with constipation: Secondary | ICD-10-CM | POA: Diagnosis not present

## 2024-01-21 DIAGNOSIS — G35 Multiple sclerosis: Secondary | ICD-10-CM | POA: Diagnosis not present

## 2024-01-21 DIAGNOSIS — K635 Polyp of colon: Secondary | ICD-10-CM | POA: Insufficient documentation

## 2024-01-21 DIAGNOSIS — F419 Anxiety disorder, unspecified: Secondary | ICD-10-CM | POA: Insufficient documentation

## 2024-01-21 HISTORY — PX: COLONOSCOPY: SHX5424

## 2024-01-21 LAB — HM COLONOSCOPY

## 2024-01-21 SURGERY — COLONOSCOPY
Anesthesia: General

## 2024-01-21 MED ORDER — PROPOFOL 500 MG/50ML IV EMUL
INTRAVENOUS | Status: DC | PRN
  Administered 2024-01-21: 125 ug/kg/min via INTRAVENOUS
  Administered 2024-01-21: 130 mg via INTRAVENOUS

## 2024-01-21 MED ORDER — PHENYLEPHRINE 80 MCG/ML (10ML) SYRINGE FOR IV PUSH (FOR BLOOD PRESSURE SUPPORT)
PREFILLED_SYRINGE | INTRAVENOUS | Status: DC | PRN
  Administered 2024-01-21 (×2): 80 ug via INTRAVENOUS

## 2024-01-21 MED ORDER — LACTATED RINGERS IV SOLN: INTRAVENOUS | Status: DC | PRN

## 2024-01-21 NOTE — Transfer of Care (Signed)
 Immediate Anesthesia Transfer of Care Note  Patient: Natalie Campos  Procedure(s) Performed: COLONOSCOPY  Patient Location: Endoscopy Unit  Anesthesia Type:General  Level of Consciousness: awake and patient cooperative  Airway & Oxygen Therapy: Patient Spontanous Breathing  Post-op Assessment: Report given to RN and Post -op Vital signs reviewed and stable  Post vital signs: Reviewed and stable  Last Vitals:  Vitals Value Taken Time  BP 102/53 01/21/24 09:23  Temp 36.5 C 01/21/24 09:23  Pulse 75 01/21/24 09:23  Resp 17 01/21/24 09:23  SpO2 97 % 01/21/24 09:23    Last Pain:  Vitals:   01/21/24 0923  TempSrc: Oral  PainSc: 0-No pain      Patients Stated Pain Goal: 6 (01/21/24 0751)  Complications: No notable events documented.

## 2024-01-21 NOTE — Anesthesia Procedure Notes (Signed)
 Date/Time: 01/21/2024 8:49 AM  Performed by: Barbarann Verneita RAMAN, CRNAPre-anesthesia Checklist: Patient identified, Emergency Drugs available, Suction available, Timeout performed and Patient being monitored Patient Re-evaluated:Patient Re-evaluated prior to induction Oxygen Delivery Method: Nasal Cannula

## 2024-01-21 NOTE — Op Note (Signed)
 [facility] Patient Name: Natalie Campos Procedure Date: 01/21/2024 8:44 AM MRN: [id number] Date of Birth: April 03, 1961 Attending MD: Toribio Fortune , , [id number] CSN: [id number] Age: 63 Admit Type: Outpatient Procedure:                Colonoscopy Indications:              Screening for colorectal malignant neoplasm Providers:                Toribio Fortune, Jon LABOR. Gerome RN, RN, Jon Loge Referring MD:              Medicines:                Monitored Anesthesia Care Complications:            No immediate complications. Estimated Blood Loss:     Estimated blood loss: none. Procedure:                Pre-Anesthesia Assessment:                           - Prior to the procedure, a History and Physical                            was performed, and patient medications, allergies                            and sensitivities were reviewed. The patient's                            tolerance of previous anesthesia was reviewed.                           - The risks and benefits of the procedure and the                            sedation options and risks were discussed with the                            patient. All questions were answered and informed                            consent was obtained.                           - ASA Grade Assessment: II - A patient with mild                            systemic disease.                           After obtaining informed consent, the colonoscope                            was passed under direct vision. Throughout the  procedure, the patient's blood pressure, pulse, and                            oxygen saturations were monitored continuously. The                            [id number]-[id number] ([id number]) Peds Colon was introduced                            through the anus and advanced to the the cecum,                            identified by appendiceal orifice and ileocecal                             valve. The colonoscopy was performed without                            difficulty. The patient tolerated the procedure                            well. The quality of the bowel preparation was fair. Scope In: 8:55:01 AM Scope Out: 9:19:09 AM Scope Withdrawal Time: 0 hours 15 minutes 10 seconds  Total Procedure Duration: 0 hours 24 minutes 8 seconds  Findings:      The perianal and digital rectal examinations were normal.      A 3 mm polyp was found in the ascending colon. The polyp was sessile.       The polyp was removed with a cold snare. Resection and retrieval were       complete.      Non-bleeding internal hemorrhoids were found during retroflexion. The       hemorrhoids were small. Impression:               - Preparation of the colon was fair.                           - One 3 mm polyp in the ascending colon, removed                            with a cold snare. Resected and retrieved.                           - Non-bleeding internal hemorrhoids. Moderate Sedation:      Per Anesthesia Care Recommendation:           - Discharge patient to home (ambulatory).                           - Resume previous diet.                           - Await pathology results.                           - Repeat colonoscopy in 5 years for screening  purposes due to bowel prep quality. Procedure Code(s):        --- Professional ---                           [phone], Colonoscopy, flexible; with removal of                            tumor(s), polyp(s), or other lesion(s) by snare                            technique Diagnosis Code(s):        --- Professional ---                           Z12.11, Encounter for screening for malignant                            neoplasm of colon                           K64.8, Other hemorrhoids                           D12.2, Benign neoplasm of ascending colon CPT copyright 2022 [employer]. All rights  reserved. The codes documented in this report are preliminary and upon coder review may  be revised to meet current compliance requirements. Toribio Fortune, MD Toribio Fortune,  01/21/2024 9:25:49 AM This report has been signed electronically. Number of Addenda: 0

## 2024-01-21 NOTE — H&P (Signed)
 Natalie Campos is an 63 y.o. female.   Chief Complaint: History of colon polyps HPI: Natalie Campos is a 63 y.o. female with past medical history of GERD complicated by short segment Barrett's esophagus, IBS-C, multiple sclerosis, IBS, coming for history of colon polyps.  Last colonoscopy was performed in 2016, colonoscopy only showed hemorrhoids.  The patient denies having any complaints such as melena, hematochezia, abdominal pain or distention, change in her bowel movement consistency or frequency, no changes in weight recently.  No family history of colorectal cancer.   Past Medical History:  Diagnosis Date   Anemia    Angio-edema    Anxiety    Barrett's esophagus    Bulging discs    Cataract    Complication of anesthesia    Complication of anesthesia    patietn wakes up easily- has anxiety due to waking up during a simple procedure   Depression    DJD (degenerative joint disease)    Dog bite(E906.0) 12/06/2009   Eczema    GERD (gastroesophageal reflux disease)    High triglycerides    Hyperlipidemia    Hypertension    IBS (irritable bowel syndrome)    contipation predominant    Iron deficiency anemia    Multiple sclerosis (HCC) 05/11/1988   Dx in 1990 most recent hospitalization for a flareis in 2011    Obesity    Osteoporosis 02/2023    Past Surgical History:  Procedure Laterality Date   bilateral cataract surgery      BIOPSY  01/28/2011   Procedure: BIOPSY;  Surgeon: Claudis RAYMOND Rivet, MD;  Location: [facility];  Service: Endoscopy;  Laterality: N/A;   BIOPSY N/A 08/25/2013   Procedure: BIOPSY;  Surgeon: Claudis RAYMOND Rivet, MD;  Location: [facility];  Service: Endoscopy;  Laterality: N/A;   BIOPSY  02/12/2021   Procedure: BIOPSY;  Surgeon: Rivet Claudis RAYMOND, MD;  Location: [facility];  Service: Endoscopy;;   CHOLECYSTECTOMY  05/11/2008   Dr. Ivery    COLONOSCOPY WITH PROPOFOL  N/A 08/25/2013   Procedure: COLONOSCOPY WITH PROPOFOL ;  Surgeon: Claudis RAYMOND Rivet, MD;   Location: [facility];  Service: Endoscopy;  Laterality: N/A;  in cecum at 0811 out at 0822 = 11 minutes   ESOPHAGOGASTRODUODENOSCOPY  01/28/2011   Procedure: ESOPHAGOGASTRODUODENOSCOPY (EGD);  Surgeon: Claudis RAYMOND Rivet, MD;  Location: [facility];  Service: Endoscopy;  Laterality: N/A;  1:00   ESOPHAGOGASTRODUODENOSCOPY  03/24/2021   Procedure: ESOPHAGOGASTRODUODENOSCOPY (EGD);  Surgeon: Gladis Cough, MD;  Location: [facility];  Service: General;;   ESOPHAGOGASTRODUODENOSCOPY (EGD) WITH PROPOFOL  N/A 08/25/2013   Procedure: ESOPHAGOGASTRODUODENOSCOPY (EGD) WITH PROPOFOL ;  Surgeon: Claudis RAYMOND Rivet, MD;  Location: [facility];  Service: Endoscopy;  Laterality: N/A;   ESOPHAGOGASTRODUODENOSCOPY (EGD) WITH PROPOFOL  N/A 02/12/2021   Procedure: ESOPHAGOGASTRODUODENOSCOPY (EGD) WITH PROPOFOL ;  Surgeon: Rivet Claudis RAYMOND, MD;  [facility]: [facility];  Service: Endoscopy;  Laterality: N/A;  7:30   ESOPHAGOGASTRODUODENOSCOPY (EGD) WITH PROPOFOL  N/A 03/30/2023   Procedure: ESOPHAGOGASTRODUODENOSCOPY (EGD) WITH PROPOFOL ;  Surgeon: Eartha Angelia Sieving, MD;  Location: [facility];  Service: Gastroenterology;  Laterality: N/A;  1:15PM;ASA 3   WISDOM TOOTH EXTRACTION     XI ROBOTIC ASSISTED HIATAL HERNIA REPAIR N/A 03/24/2021   Procedure: XI ROBOTIC ASSISTED HIATAL HERNIA REPAIR WITH FUNDOPLICATION;  Surgeon: Gladis Cough, MD;  Location: [facility];  Service: General;  Laterality: N/A;    Family History  Problem Relation Age of Onset   COPD Mother    Alcoholism Mother  Mental illness Mother    Alcoholism Father    Eczema Father    Asthma Sister    Hypertension Sister    Asthma Brother    Asthma Maternal Uncle    Asthma Paternal Uncle    Allergic rhinitis Daughter    Ulcerative colitis Daughter    Asthma Daughter    Healthy Daughter    Irritable bowel syndrome Daughter    Social History:  reports that she quit smoking about 15 years ago. Her smoking use included cigarettes. She started smoking about  45 years ago. She has a 45 pack-year smoking history. She has been exposed to tobacco smoke. She has never used smokeless tobacco. She reports that she does not drink alcohol and does not use drugs.  Allergies:  Allergies  Allergen Reactions   Ace Inhibitors Anaphylaxis   Atenolol Anaphylaxis   Losartan  Swelling    Severe tongue swelling   Penicillins     Childhood Allergy  - caused coma  Has patient had a PCN reaction causing immediate rash, facial/tongue/throat swelling, SOB or lightheadedness with hypotension: No Has patient had a PCN reaction causing severe rash involving mucus membranes or skin necrosis: No Has patient had a PCN reaction that required hospitalization: No Has patient had a PCN reaction occurring within the last 10 years: No If all of the above answers are NO, then may proceed with Cephalosporin use.     Medications Prior to Admission  Medication Sig Dispense Refill   amLODipine  (NORVASC ) 5 MG tablet Take 5 mg by mouth daily.     ARIPiprazole  (ABILIFY ) 5 MG tablet Take 5 mg by mouth daily.     baclofen  (LIORESAL ) 10 MG tablet Take 10 mg by mouth 3 (three) times daily.     busPIRone  (BUSPAR ) 10 MG tablet Take 10 mg by mouth 3 (three) times daily.     clobetasol cream (TEMOVATE) 0.05 % Apply 1 Application topically 2 (two) times daily.     docusate sodium (COLACE) 100 MG capsule Take 100 mg by mouth 2 (two) times daily.     DULoxetine  (CYMBALTA ) 60 MG capsule Take 60 mg by mouth daily.     ferrous sulfate  325 (65 FE) MG tablet Take 325 mg by mouth daily with breakfast.     gabapentin  (NEURONTIN ) 600 MG tablet Take 600 mg by mouth 3 (three) times daily.     HYDROmorphone  (DILAUDID ) 4 MG tablet Take 4 mg by mouth every 4 (four) hours as needed for severe pain (pain score 7-10).     Lubiprostone  (AMITIZA  PO) Take 1 tablet by mouth in the morning and at bedtime.     Multiple Vitamin (MULTIVITAMIN ADULT PO) Take 1 tablet by mouth daily at 12 noon.     omega-3 acid ethyl  esters (LOVAZA) 1 g capsule Take 1 g by mouth 2 (two) times daily.     polyethylene glycol (MIRALAX  / GLYCOLAX ) 17 g packet Take 17 g by mouth daily.     POTASSIUM CHLORIDE  PO Take 1 tablet by mouth in the morning and at bedtime.     RABEprazole  (ACIPHEX ) 20 MG tablet Take 20 mg by mouth daily.     tamsulosin  (FLOMAX ) 0.4 MG CAPS capsule Take 0.4 mg by mouth daily.     Vitamin D , Ergocalciferol , (DRISDOL) 1.25 MG (50000 UNIT) CAPS capsule Take 50,000 Units by mouth every 7 (seven) days.     fluticasone  (FLONASE ) 50 MCG/ACT nasal spray Place 2 sprays into both nostrils daily.     modafinil  (  PROVIGIL ) 200 MG tablet Take 200 mg by mouth daily.     naloxone  (NARCAN ) nasal spray 4 mg/0.1 mL Place 1 spray into the nose.      No results found for this or any previous visit (from the past 48 hours). No results found.  Review of Systems  All other systems reviewed and are negative.   Blood pressure (!) 143/87, pulse 89, temperature 98.3 F (36.8 C), temperature source Oral, resp. rate 16, height 5' 9 (1.753 m), weight 102.5 kg, SpO2 95%. Physical Exam  GENERAL: The patient is AO x3, in no acute distress. HEENT: Head is normocephalic and atraumatic. EOMI are intact. Mouth is well hydrated and without lesions. NECK: Supple. No masses LUNGS: Clear to auscultation. No presence of rhonchi/wheezing/rales. Adequate chest expansion HEART: RRR, normal s1 and s2. ABDOMEN: Soft, nontender, no guarding, no peritoneal signs, and nondistended. BS +. No masses. EXTREMITIES: Without any cyanosis, clubbing, rash, lesions or edema. NEUROLOGIC: AOx3, no focal motor deficit. SKIN: no jaundice, no rashes  Assessment/Plan Natalie Campos is a 63 y.o. female with past medical history of GERD complicated by short segment Barrett's esophagus, IBS-C, multiple sclerosis, IBS, coming for history of colon polyps.  Will proceed with colonoscopy.  Toribio Eartha Flavors, MD 01/21/2024, 8:19 AM

## 2024-01-21 NOTE — Discharge Instructions (Addendum)
 You are being discharged to home.  Resume your previous diet.  We are waiting for your pathology results.  Your physician has recommended a repeat colonoscopy in five years for screening purposes due to bowel prep quality.

## 2024-01-21 NOTE — Anesthesia Postprocedure Evaluation (Signed)
 Anesthesia Post Note  Patient: Natalie Campos  Procedure(s) Performed: COLONOSCOPY  Patient location during evaluation: PACU Anesthesia Type: General Level of consciousness: awake and alert Pain management: pain level controlled Vital Signs Assessment: post-procedure vital signs reviewed and stable Respiratory status: spontaneous breathing, nonlabored ventilation, respiratory function stable and patient connected to nasal cannula oxygen Cardiovascular status: blood pressure returned to baseline and stable Postop Assessment: no apparent nausea or vomiting Anesthetic complications: no   No notable events documented.   Last Vitals:  Vitals:   01/21/24 0751 01/21/24 0923  BP: (!) 143/87 (!) 102/53  Pulse: 89 75  Resp: 16 17  Temp: 36.8 C 36.5 C  SpO2: 95% 97%    Last Pain:  Vitals:   01/21/24 0923  TempSrc: Oral  PainSc: 0-No pain                 Andrea Limes

## 2024-01-21 NOTE — Anesthesia Preprocedure Evaluation (Addendum)
 Anesthesia Evaluation  Patient identified by MRN, date of birth, ID band Patient awake    Reviewed: Allergy  & Precautions, H&P , NPO status , Patient's Chart, lab work & pertinent test results  History of Anesthesia Complications (+) history of anesthetic complications  Airway Mallampati: IV  TM Distance: >3 FB Neck ROM: Full  Mouth opening: Limited Mouth Opening Comment: Pt denies any history of a difficult intubation Had chole many years ago without any apparent issues Dental no notable dental hx.    Pulmonary COPD, former smoker Upper respiratory infection Saw PCP; no abx Lungs clear/afebrile   Pulmonary exam normal breath sounds clear to auscultation       Cardiovascular hypertension, Normal cardiovascular exam Rhythm:Regular Rate:Normal     Neuro/Psych  PSYCHIATRIC DISORDERS Anxiety Depression     Neuromuscular disease    GI/Hepatic Neg liver ROS, hiatal hernia,GERD  ,,  Endo/Other  negative endocrine ROS    Renal/GU negative Renal ROS  negative genitourinary   Musculoskeletal  (+) Arthritis ,    Abdominal   Peds negative pediatric ROS (+)  Hematology  (+) Blood dyscrasia, anemia   Anesthesia Other Findings   Reproductive/Obstetrics negative OB ROS                              Anesthesia Physical Anesthesia Plan  ASA: 3  Anesthesia Plan: General   Post-op Pain Management:    Induction: Intravenous  PONV Risk Score and Plan:   Airway Management Planned: Nasal Cannula  Additional Equipment:   Intra-op Plan:   Post-operative Plan:   Informed Consent: I have reviewed the patients History and Physical, chart, labs and discussed the procedure including the risks, benefits and alternatives for the proposed anesthesia with the patient or authorized representative who has indicated his/her understanding and acceptance.     Dental advisory given  Plan Discussed with:  CRNA  Anesthesia Plan Comments:          Anesthesia Quick Evaluation

## 2024-01-24 ENCOUNTER — Encounter (HOSPITAL_COMMUNITY): Payer: Self-pay | Admitting: Gastroenterology

## 2024-01-24 ENCOUNTER — Encounter (INDEPENDENT_AMBULATORY_CARE_PROVIDER_SITE_OTHER): Payer: Self-pay | Admitting: *Deleted

## 2024-01-25 ENCOUNTER — Ambulatory Visit (INDEPENDENT_AMBULATORY_CARE_PROVIDER_SITE_OTHER): Payer: Self-pay | Admitting: Gastroenterology

## 2024-01-25 LAB — SURGICAL PATHOLOGY

## 2024-01-26 ENCOUNTER — Encounter: Payer: Self-pay | Admitting: Family Medicine

## 2024-01-26 NOTE — Progress Notes (Signed)
 5 yr TCS noted in recall Patient result letter mailed Patient's PCP is on EPIC

## 2024-01-27 ENCOUNTER — Other Ambulatory Visit: Payer: Self-pay | Admitting: Family Medicine

## 2024-01-27 ENCOUNTER — Other Ambulatory Visit: Payer: Self-pay | Admitting: Neurology

## 2024-01-27 MED ORDER — CLOBETASOL PROPIONATE 0.05 % EX CREA: TOPICAL_CREAM | CUTANEOUS | 3 refills | Status: AC

## 2024-01-27 NOTE — Telephone Encounter (Signed)
 Last seen on 07/14/23 Follow up scheduled on 02/16/24

## 2024-02-16 ENCOUNTER — Encounter: Payer: Self-pay | Admitting: Neurology

## 2024-02-16 ENCOUNTER — Other Ambulatory Visit: Payer: Self-pay | Admitting: Family Medicine

## 2024-02-16 ENCOUNTER — Ambulatory Visit: Admitting: Neurology

## 2024-02-16 VITALS — BP 124/82 | HR 75 | Ht 69.0 in | Wt 224.5 lb

## 2024-02-16 DIAGNOSIS — R208 Other disturbances of skin sensation: Secondary | ICD-10-CM | POA: Diagnosis not present

## 2024-02-16 DIAGNOSIS — G35C0 Secondary progressive multiple sclerosis, unspecified: Secondary | ICD-10-CM

## 2024-02-16 DIAGNOSIS — R252 Cramp and spasm: Secondary | ICD-10-CM

## 2024-02-16 DIAGNOSIS — F411 Generalized anxiety disorder: Secondary | ICD-10-CM | POA: Insufficient documentation

## 2024-02-16 DIAGNOSIS — R32 Unspecified urinary incontinence: Secondary | ICD-10-CM

## 2024-02-16 DIAGNOSIS — R269 Unspecified abnormalities of gait and mobility: Secondary | ICD-10-CM | POA: Diagnosis not present

## 2024-02-16 DIAGNOSIS — F4310 Post-traumatic stress disorder, unspecified: Secondary | ICD-10-CM | POA: Insufficient documentation

## 2024-02-16 MED ORDER — MIRABEGRON ER 50 MG PO TB24: 50.0000 mg | ORAL_TABLET | Freq: Every day | ORAL | 11 refills | Status: AC

## 2024-02-16 MED ORDER — BACLOFEN 10 MG PO TABS: ORAL_TABLET | ORAL | 11 refills | Status: AC

## 2024-02-16 NOTE — Progress Notes (Signed)
 [facility]  PATIENT: Natalie Campos DOB: 1960/11/05  REFERRING DOCTOR OR PCP Glendia Fielding MD SOURCE: Patient, notes from Dr. Milton, imaging and lab reports, MRI images personally reviewed.  _________________________________   HISTORICAL  CHIEF COMPLAINT:  Chief Complaint  Patient presents with   RM11/MS    Pt is here with her Daughter. Pt states that things have been about the same since her last appointment. Pt states that she is having muscle spasms in her legs and arms. Pt repots having tremors in her arms last week. Pt states that her legs as well as her body is weak. Pt states that she has been having a lot of fatigue.     HISTORY OF PRESENT ILLNESS:  Natalie Campos is a 63 y.o. woman with a diagnosis of multiple sclerosis.  UPDATE 07/14/2023: She was diagnosed with MS around 1983.  Recent MRIs show overall low plaque burden.  She continues to report difficulties with her gait, fatigue and spasticity.  Currently, she has difficulty with gait and uses a cane and uses a walker if more tired.   She shuffles more.  Her left leg is weaker than the right.   She notes dystonia in the feet >> arms,  left worse than right.   Most spells occur while sitting or laying down.    Episodes last a few minutes up to 20 minutes.   She notes the right hand has a reduced grip.    Handwriting is poor and sometimes small.  She is currently on baclofen  10 mg p.o. three daily and she is also on gabapentin  600 mg po tid for neuropathic pain.   Baclofen  has not helped much.  She is also on Dilaudid  for musculoskeletal pain.     She has a lot of spasticity in legs an hands.   She felt better on valium  but since also on Dilaudid , the combination has more risk and the Valium  has been discontinued.    She notes that her toes curl up.   She has urinary urgency, nocturia and some nocturnal incontinence. Hesitancy was just slightly better with tamsulosin  so she stopped.   She has nocturia x  3-4 at night and often has incontinence.  Myrbetriq worked better but was too expensive.   She has severe fatigue most afternoons.  She was taking modafinil  at 9 am and 11 am but it made her feel anxious and she stopped.    Adderall has helped her fatigue and sleepiness some but was poorly tolerated due to elevated blood pressure.  Therefore, she rarely takes.      She sleeps ok (6 hours) most days.     She has left knee DJD but due to poor gait, orthopedics is not planning surgery due to her risks    MS History: She was diagnosed with MS in her 20's after presenting with left ON.   She had another exacerbation in her early 30's affecting strength and gait.   She was placed on Avonex in 1994/1995 but had flu-like reactions and felt weaker.   She was placed on Copaxone a coupe years later and stopped around 2005 due to breakthrough activity on MRI and skin lipoatrophy.   She was on no DMT for 8-10 years and then placed on Ocrevus  in 2018.    She is on Ocrevus  since 2018.  She has had a rash after each infusion that would be off/on in between infusions.   She also was diagnosed with psoriasis afterwards.  Although she did very well initially, she has had a few times when she has needed to be in wheelchair.  She feels over the last 10 to 20 years that there have not been exacerbations though there have been times she has done worse.  She notes that her slope of progression on Ocrevus  is probably about the same as it had been before the Ocrevus .  Serial MRIs between 2006 and 2023 have not shown new MS lesions in the brain.  She has an overall low plaque burden in the brain.  There are no lesions in the brainstem.  She does not have foci in the cervical spine.  She has not had a thoracic spine scan.   IMAGING: MRI of the brain 12/19/2022 showed stable demyelinating disease in the hemispheres.  No enhancing lesions.  MRI of the cervical and thoracic spine 12/03/2022 showed a normal spinal cord.  He has  degenerative changes at C5-C6 and C6-C7 with foraminal narrowing more to the left at C5-C6 and to the right at C6-C7.  No significant spinal stenosis noted.  MRI of the brain 07/23/2021, 07/20/2012 and 08/13/2009 showed identical pattern of T2/FLAIR hyperintense foci in the periventricular, juxtacortical   white matter.  The overall plaque burden is low.  There were no foci in the infratentorial white matter.  MRI of the cervical spine 07/23/2021 and 08/13/2009 showed normal spinal cord.  She does have some degenerative changes.  No spinal stenosis.  She has degenerative changes at C5-C6 and C6-C7 with potential for left C6 and right C7 nerve root compression on the more recent images.Natalie Campos   REVIEW OF SYSTEMS: Constitutional: No fevers, chills, sweats, or change in appetite Eyes: No visual changes, double vision, eye pain Ear, nose and throat: No hearing loss, ear pain, nasal congestion, sore throat Cardiovascular: No chest pain, palpitations Respiratory:  No shortness of breath at rest or with exertion.   No wheezes GastrointestinaI: No nausea, vomiting, diarrhea, abdominal pain, fecal incontinence.  Has GERD Genitourinary:  No dysuria, urinary retention or frequency.  No nocturia. Musculoskeletal:  No neck pain, back pain Integumentary: No rash, pruritus, skin lesions Neurological: as above Psychiatric: No depression at this time.  No anxiety Endocrine: No palpitations, diaphoresis, change in appetite, change in weigh or increased thirst Hematologic/Lymphatic:  No anemia, purpura, petechiae. Allergic/Immunologic: No itchy/runny eyes, nasal congestion, recent allergic reactions, rashes  ALLERGIES: Allergies  Allergen Reactions   Ace Inhibitors Anaphylaxis   Atenolol Anaphylaxis   Losartan  Swelling    Severe tongue swelling   Penicillins     Childhood Allergy  - caused coma  Has patient had a PCN reaction causing immediate rash, facial/tongue/throat swelling, SOB or lightheadedness with  hypotension: No Has patient had a PCN reaction causing severe rash involving mucus membranes or skin necrosis: No Has patient had a PCN reaction that required hospitalization: No Has patient had a PCN reaction occurring within the last 10 years: No If all of the above answers are NO, then may proceed with Cephalosporin use.     HOME MEDICATIONS:  Current Outpatient Medications:    amLODipine  (NORVASC ) 5 MG tablet, Take 5 mg by mouth daily., Disp: , Rfl:    ARIPiprazole  (ABILIFY ) 5 MG tablet, Take 5 mg by mouth daily., Disp: , Rfl:    busPIRone  (BUSPAR ) 10 MG tablet, Take 10 mg by mouth 3 (three) times daily., Disp: , Rfl:    clobetasol  cream (TEMOVATE ) 0.05 %, Apply thin amount to eczema areas on hands and arms as needed  to help control eczema twice daily.  As needed, Disp: 45 g, Rfl: 3   docusate sodium (COLACE) 100 MG capsule, Take 100 mg by mouth 2 (two) times daily., Disp: , Rfl:    DULoxetine  (CYMBALTA ) 60 MG capsule, Take 60 mg by mouth daily., Disp: , Rfl:    ferrous sulfate  325 (65 FE) MG tablet, Take 325 mg by mouth daily with breakfast., Disp: , Rfl:    gabapentin  (NEURONTIN ) 600 MG tablet, Take 600 mg by mouth 3 (three) times daily., Disp: , Rfl:    HYDROmorphone  (DILAUDID ) 4 MG tablet, Take 4 mg by mouth every 4 (four) hours as needed for severe pain (pain score 7-10)., Disp: , Rfl:    Lubiprostone  (AMITIZA  PO), Take 1 tablet by mouth in the morning and at bedtime., Disp: , Rfl:    mirabegron ER (MYRBETRIQ) 50 MG TB24 tablet, Take 1 tablet (50 mg total) by mouth daily., Disp: 30 tablet, Rfl: 11   Multiple Vitamin (MULTIVITAMIN ADULT PO), Take 1 tablet by mouth daily at 12 noon., Disp: , Rfl:    omega-3 acid ethyl esters (LOVAZA) 1 g capsule, Take 1 g by mouth 2 (two) times daily., Disp: , Rfl:    polyethylene glycol (MIRALAX  / GLYCOLAX ) 17 g packet, Take 17 g by mouth daily., Disp: , Rfl:    POTASSIUM CHLORIDE  PO, Take 1 tablet by mouth in the morning and at bedtime., Disp: ,  Rfl:    RABEprazole  (ACIPHEX ) 20 MG tablet, Take 20 mg by mouth daily., Disp: , Rfl:    Vitamin D , Ergocalciferol , (DRISDOL) 1.25 MG (50000 UNIT) CAPS capsule, Take 50,000 Units by mouth every 7 (seven) days., Disp: , Rfl:    baclofen  (LIORESAL ) 10 MG tablet, Take one po qAM, one po qPM and two po qHS, Disp: 120 each, Rfl: 11   fluticasone  (FLONASE ) 50 MCG/ACT nasal spray, Place 2 sprays into both nostrils daily. (Patient not taking: Reported on 02/16/2024), Disp: , Rfl:    modafinil  (PROVIGIL ) 200 MG tablet, Take 200 mg by mouth daily. (Patient not taking: Reported on 02/16/2024), Disp: , Rfl:    naloxone  (NARCAN ) nasal spray 4 mg/0.1 mL, Place 1 spray into the nose. (Patient not taking: Reported on 02/16/2024), Disp: , Rfl:    tamsulosin  (FLOMAX ) 0.4 MG CAPS capsule, Take 1 capsule by mouth once daily (Patient not taking: Reported on 02/16/2024), Disp: 30 capsule, Rfl: 0  PAST MEDICAL HISTORY: Past Medical History:  Diagnosis Date   Anemia    Angio-edema    Anxiety    Barrett's esophagus    Bulging discs    Cataract    Complication of anesthesia    Complication of anesthesia    patietn wakes up easily- has anxiety due to waking up during a simple procedure   Depression    DJD (degenerative joint disease)    Dog bite(E906.0) 12/06/2009   Eczema    GERD (gastroesophageal reflux disease)    High triglycerides    Hyperlipidemia    Hypertension    IBS (irritable bowel syndrome)    contipation predominant    Iron deficiency anemia    Multiple sclerosis 05/11/1988   Dx in 1990 most recent hospitalization for a flareis in 2011    Obesity    Osteoporosis 02/2023    PAST SURGICAL HISTORY: Past Surgical History:  Procedure Laterality Date   bilateral cataract surgery      BIOPSY  01/28/2011   Procedure: BIOPSY;  Surgeon: Claudis RAYMOND Rivet, MD;  [facility];  Service: Endoscopy;  Laterality: N/A;   BIOPSY N/A 08/25/2013   Procedure: BIOPSY;  Surgeon: Claudis RAYMOND Rivet, MD;   Location: [facility];  Service: Endoscopy;  Laterality: N/A;   BIOPSY  02/12/2021   Procedure: BIOPSY;  Surgeon: Rivet Claudis RAYMOND, MD;  [facility];  Service: Endoscopy;;   CHOLECYSTECTOMY  05/11/2008   Dr. Ivery    COLONOSCOPY N/A 01/21/2024   Procedure: COLONOSCOPY;  Surgeon: Eartha Angelia Sieving, MD;  [facility];  Service: Gastroenterology;  Laterality: N/A;  9:00 am, asa 1-2   COLONOSCOPY WITH PROPOFOL  N/A 08/25/2013   Procedure: COLONOSCOPY WITH PROPOFOL ;  Surgeon: Claudis RAYMOND Rivet, MD;  Location: [facility];  Service: Endoscopy;  Laterality: N/A;  in cecum at 0811 out at 0822 = 11 minutes   ESOPHAGOGASTRODUODENOSCOPY  01/28/2011   Procedure: ESOPHAGOGASTRODUODENOSCOPY (EGD);  Surgeon: Claudis RAYMOND Rivet, MD;  [facility];  Service: Endoscopy;  Laterality: N/A;  1:00   ESOPHAGOGASTRODUODENOSCOPY  03/24/2021   Procedure: ESOPHAGOGASTRODUODENOSCOPY (EGD);  Surgeon: Gladis Cough, MD;  Location: [facility];  Service: General;;   ESOPHAGOGASTRODUODENOSCOPY (EGD) WITH PROPOFOL  N/A 08/25/2013   Procedure: ESOPHAGOGASTRODUODENOSCOPY (EGD) WITH PROPOFOL ;  Surgeon: Claudis RAYMOND Rivet, MD;  Location: [facility];  Service: Endoscopy;  Laterality: N/A;   ESOPHAGOGASTRODUODENOSCOPY (EGD) WITH PROPOFOL  N/A 02/12/2021   Procedure: ESOPHAGOGASTRODUODENOSCOPY (EGD) WITH PROPOFOL ;  Surgeon: Rivet Claudis RAYMOND, MD;  [facility];  Service: Endoscopy;  Laterality: N/A;  7:30   ESOPHAGOGASTRODUODENOSCOPY (EGD) WITH PROPOFOL  N/A 03/30/2023   Procedure: ESOPHAGOGASTRODUODENOSCOPY (EGD) WITH PROPOFOL ;  Surgeon: Eartha Angelia Sieving, MD;  [facility];  Service: Gastroenterology;  Laterality: N/A;  1:15PM;ASA 3   WISDOM TOOTH EXTRACTION     XI ROBOTIC ASSISTED HIATAL HERNIA REPAIR N/A 03/24/2021   Procedure: XI ROBOTIC ASSISTED HIATAL HERNIA REPAIR WITH FUNDOPLICATION;  Surgeon: Gladis Cough, MD;  Location: [facility];  Service: General;  Laterality: N/A;    FAMILY  HISTORY: Family History  Problem Relation Age of Onset   COPD Mother    Alcoholism Mother    Mental illness Mother    Alcoholism Father    Eczema Father    Asthma Sister    Hypertension Sister    Asthma Brother    Asthma Maternal Uncle    Asthma Paternal Uncle    Allergic rhinitis Daughter    Ulcerative colitis Daughter    Asthma Daughter    Healthy Daughter    Irritable bowel syndrome Daughter     SOCIAL HISTORY: Social History   Socioeconomic History   Marital status: Married    Spouse name: Not on file   Number of children: 3   Years of education: Not on file   Highest education level: GED or equivalent  Occupational History   Occupation: disabled since 2002  Tobacco Use   Smoking status: Former    Current packs/day: 0.00    Average packs/day: 1.5 packs/day for 30.0 years (45.0 ttl pk-yrs)    Types: Cigarettes    Start date: 03/08/1978    Quit date: 03/08/2008    Years since quitting: 15.9    Passive exposure: Past   Smokeless tobacco: Never  Vaping Use   Vaping status: Never Used  Substance and Sexual Activity   Alcohol use: No    Alcohol/week: 0.0 standard drinks of alcohol   Drug use: No   Sexual activity: Yes    Birth control/protection: Post-menopausal  Other Topics Concern   Not on file  Social  History Narrative   Two grown children, still birth infant    Social Drivers of Corporate investment banker Strain: Low Risk  (11/29/2023)   Overall Financial Resource Strain (CARDIA)    Difficulty of Paying Living Expenses: Not very hard  Food Insecurity: No Food Insecurity (11/29/2023)   Hunger Vital Sign    Worried About Running Out of Food in the Last Year: Never true    Ran Out of Food in the Last Year: Never true  Transportation Needs: No Transportation Needs (12/03/2023)   PRAPARE - Administrator, Civil Service (Medical): No    Lack of Transportation (Non-Medical): No  Physical Activity: Inactive (11/29/2023)   Exercise Vital Sign     Days of Exercise per Week: 0 days    Minutes of Exercise per Session: 0 min  Stress: Stress Concern Present (11/29/2023)   [employer] of Occupational Health - Occupational Stress Questionnaire    Feeling of Stress: To some extent  Social Connections: Moderately Isolated (11/29/2023)   Social Connection and Isolation Panel    Frequency of Communication with Friends and Family: Once a week    Frequency of Social Gatherings with Friends and Family: Once a week    Attends Religious Services: More than 4 times per year    Active Member of [employer] or Organizations: No    Attends Banker Meetings: Never    Marital Status: Married  Catering manager Violence: Not At Risk (12/03/2023)   Humiliation, Afraid, Rape, and Kick questionnaire    Fear of Current or Ex-Partner: No    Emotionally Abused: No    Physically Abused: No    Sexually Abused: No       PHYSICAL EXAM  Vitals:   02/16/24 1413  BP: 124/82  Pulse: 75  SpO2: 98%  Weight: 224 lb 8 oz (101.8 kg)  Height: 5' 9 (1.753 m)    Body mass index is 33.15 kg/m.   General: The patient is well-developed and well-nourished and in no acute distress  HEENT:  Head is [place]/AT.  Sclera are anicteric.    Skin: Extremities are without rash or  edema.  Musculoskeletal:  Back is nontender  Neurologic Exam  Mental status: The patient is alert and oriented x 3 at the time of the examination. The patient has apparent normal recent and remote memory, with an apparently normal attention span and concentration ability.   Speech is normal.  Cranial nerves: Extraocular movements are full.   Facial strength and sensation was normal.  No dysarthria. No obvious hearing deficits are noted.  Motor:  Muscle bulk is normal.   Tone is normal in arms and mildly increased in legs. Strength is 4/5 in the iliopsoas and 4+/5 in other proximal limb muscles and 5 / 5 in distal muscles  Sensory: Sensory testing is intact to pinprick, soft touch  and vibration sensation in the arms.  She reported reduced vibration sensation in the left leg relative to the right..  Coordination: Cerebellar testing reveals good finger-nose-finger and heel-to-shin bilaterally.  Gait and station: She needs to use her arms to rise from a chair.  Once up the station was stable.  Her gait was wide.  Gait is very poor without her walker.  She takes mildly reduced stride with a walker.  Romberg is negative.   Reflexes: Deep tendon reflexes are symmetric and normal in the arms and knees and 1 at the ankles.Natalie Campos       DIAGNOSTIC DATA (LABS,  IMAGING, TESTING) - I reviewed patient records, labs, notes, testing and imaging myself where available.  Lab Results  Component Value Date   WBC 7.1 03/29/2023   HGB 14.3 03/29/2023   HCT 42.0 03/29/2023   MCV 94.4 03/29/2023   PLT 333 03/29/2023      Component Value Date/Time   NA 141 07/07/2023 1438   K 4.1 07/07/2023 1438   CL 99 07/07/2023 1438   CO2 25 07/07/2023 1438   GLUCOSE 116 (H) 07/07/2023 1438   GLUCOSE 119 (H) 03/29/2023 1135   BUN 13 07/07/2023 1438   CREATININE 0.78 07/07/2023 1438   CREATININE 0.78 09/18/2013 0949   CALCIUM 10.0 07/07/2023 1438   PROT 7.4 07/07/2023 1438   ALBUMIN 4.4 07/07/2023 1438   AST 20 07/07/2023 1438   ALT 19 07/07/2023 1438   ALKPHOS 200 (H) 07/07/2023 1438   BILITOT 0.3 07/07/2023 1438   GFRNONAA >60 03/29/2023 1135   GFRAA 77 03/04/2020 1446   Lab Results  Component Value Date   CHOL 206 (H) 07/07/2023   HDL 76 07/07/2023   LDLCALC 101 (H) 07/07/2023   TRIG 174 (H) 07/07/2023   CHOLHDL 2.7 07/07/2023   Lab Results  Component Value Date   HGBA1C 5.4 07/07/2023   Lab Results  Component Value Date   VITAMINB12 910 06/11/2014   Lab Results  Component Value Date   TSH 3.140 03/29/2023       ASSESSMENT AND PLAN  Secondary progressive multiple sclerosis, unspecified  Gait disturbance  Spasms of the hands or feet  Dysesthesia  Urinary  incontinence, unspecified type   Her MS has been stable.  We discussed that she has a low plaque burden in the brain and no lesions in the spinal cord noted on MRI.    She will remain off of a disease modifying therapy for now.  In the future, if breakthrough activity is occurring we would need to get her on a disease modifying therapy.  If this occurs, Mavenclad or Aubagio would be other choices. Stay active and exercise as tolerated. She can take baclofen  10 mg p.o. up to to 4 times daily  She is having urinary urge incontinence and has 4 times nocturia.  I will add Myrbetriq for the bladder.  Advised to discontinue if urinary hesitancy worsens.   Return in 6 months or sooner if there are new or worsening neurologic symptoms..   This visit is part of a comprehensive longitudinal care medical relationship regarding the patients primary diagnosis of MS and related concerns.   Jasean Ambrosia A. Vear, MD, [facility] 02/16/2024, 2:39 PM Certified in Neurology, Clinical Neurophysiology, Sleep Medicine and Neuroimaging  [facility] [address], [address] [place], [place] [address] [phone]

## 2024-02-21 ENCOUNTER — Other Ambulatory Visit: Payer: Self-pay | Admitting: Family Medicine

## 2024-02-23 ENCOUNTER — Encounter (INDEPENDENT_AMBULATORY_CARE_PROVIDER_SITE_OTHER): Payer: Self-pay | Admitting: Gastroenterology

## 2024-02-25 ENCOUNTER — Other Ambulatory Visit: Payer: Self-pay | Admitting: Neurology

## 2024-02-25 NOTE — Telephone Encounter (Signed)
 Dr Vear, do you wish to continue this in addition to the Myrbetriq you prescribed? Last note did not specify to continue Flomax .   Last note below: Her MS has been stable.  We discussed that she has a low plaque burden in the brain and no lesions in the spinal cord noted on MRI.    She will remain off of a disease modifying therapy for now.  In the future, if breakthrough activity is occurring we would need to get her on a disease modifying therapy.  If this occurs, Mavenclad or Aubagio would be other choices. Stay active and exercise as tolerated. She can take baclofen  10 mg p.o. up to to 4 times daily  She is having urinary urge incontinence and has 4 times nocturia.  I will add Myrbetriq for the bladder.  Advised to discontinue if urinary hesitancy worsens.   Return in 6 months or sooner if there are new or worsening neurologic symptoms..     This visit is part of a comprehensive longitudinal care medical relationship regarding the patients primary diagnosis of MS and related concerns.     Richard A. Vear, MD, [facility] 02/16/2024, 2:39 PM Certified in Neurology, Clinical Neurophysiology, Sleep Medicine and Neuroimaging   [facility] [address], [address] [place], [place] [address] [phone]

## 2024-02-28 ENCOUNTER — Telehealth: Payer: Self-pay | Admitting: Family Medicine

## 2024-02-28 NOTE — Telephone Encounter (Signed)
 Patient is requesting a prescription for indapamide  1.25 mg  [facility]

## 2024-02-29 ENCOUNTER — Other Ambulatory Visit: Payer: Self-pay | Admitting: Physician Assistant

## 2024-02-29 DIAGNOSIS — R0602 Shortness of breath: Secondary | ICD-10-CM

## 2024-02-29 MED ORDER — INDAPAMIDE 1.25 MG PO TABS: 1.2500 mg | ORAL_TABLET | Freq: Every day | ORAL | 0 refills | Status: DC

## 2024-03-03 NOTE — Telephone Encounter (Signed)
 Patient notified and stated she will have lab work done next week and scheduled follow up office visit with Dr Glendia in December

## 2024-03-13 ENCOUNTER — Encounter: Payer: Self-pay | Admitting: Radiology

## 2024-03-13 ENCOUNTER — Other Ambulatory Visit: Payer: Self-pay | Admitting: Family Medicine

## 2024-03-13 ENCOUNTER — Encounter: Payer: Self-pay | Admitting: Family Medicine

## 2024-03-14 ENCOUNTER — Other Ambulatory Visit: Payer: Self-pay | Admitting: Family Medicine

## 2024-03-14 ENCOUNTER — Other Ambulatory Visit: Payer: Self-pay

## 2024-03-14 MED ORDER — LUBIPROSTONE 8 MCG PO CAPS: 8.0000 ug | ORAL_CAPSULE | Freq: Two times a day (BID) | ORAL | 12 refills | Status: AC

## 2024-03-23 ENCOUNTER — Other Ambulatory Visit: Payer: Self-pay | Admitting: Physician Assistant

## 2024-03-23 DIAGNOSIS — R0602 Shortness of breath: Secondary | ICD-10-CM

## 2024-03-25 LAB — BASIC METABOLIC PANEL WITH GFR
BUN/Creatinine Ratio: 18 (ref 12–28)
BUN: 14 mg/dL (ref 8–27)
CO2: 28 mmol/L (ref 20–29)
Calcium: 9.4 mg/dL (ref 8.7–10.3)
Chloride: 100 mmol/L (ref 96–106)
Creatinine, Ser: 0.8 mg/dL (ref 0.57–1.00)
Glucose: 75 mg/dL (ref 70–99)
Potassium: 3.8 mmol/L (ref 3.5–5.2)
Sodium: 142 mmol/L (ref 134–144)
eGFR: 82 mL/min/1.73 (ref >59)

## 2024-03-27 ENCOUNTER — Other Ambulatory Visit: Payer: Self-pay | Admitting: Neurology

## 2024-03-27 ENCOUNTER — Ambulatory Visit: Payer: Self-pay | Admitting: Physician Assistant

## 2024-03-27 NOTE — Telephone Encounter (Signed)
 Last seen on 02/16/24 per note  She is having urinary urge incontinence and has 4 times nocturia. I will add Myrbetriq for the bladder. Advised to discontinue if urinary hesitancy worsens.    Follow up scheduled on 09/14/24   I didn't see it mentioned about tamsulosin  0.4 mg  Rx pending

## 2024-04-11 ENCOUNTER — Other Ambulatory Visit: Payer: Self-pay | Admitting: Neurology

## 2024-04-11 ENCOUNTER — Telehealth: Payer: Self-pay | Admitting: Neurology

## 2024-04-11 MED ORDER — DULOXETINE HCL 60 MG PO CPEP: 60.0000 mg | ORAL_CAPSULE | Freq: Every day | ORAL | 11 refills | Status: DC

## 2024-04-11 MED ORDER — DULOXETINE HCL 60 MG PO CPEP: 60.0000 mg | ORAL_CAPSULE | Freq: Every day | ORAL | 3 refills | Status: AC

## 2024-04-11 NOTE — Telephone Encounter (Signed)
 Pt called to request medication refill DULoxetine  (CYMBALTA ) 60 MG capsule   Pharmacy : [facility] [place], [place] - [address] Phone: [phone]  Fax: [phone]

## 2024-04-20 ENCOUNTER — Ambulatory Visit: Admitting: Family Medicine

## 2024-04-20 VITALS — BP 124/78 | HR 96 | Temp 97.3°F | Ht 69.0 in

## 2024-04-20 DIAGNOSIS — M816 Localized osteoporosis [Lequesne]: Secondary | ICD-10-CM

## 2024-04-20 DIAGNOSIS — Z79899 Other long term (current) drug therapy: Secondary | ICD-10-CM

## 2024-04-20 DIAGNOSIS — G35D Multiple sclerosis, unspecified: Secondary | ICD-10-CM

## 2024-04-20 DIAGNOSIS — E7849 Other hyperlipidemia: Secondary | ICD-10-CM

## 2024-04-20 DIAGNOSIS — Z23 Encounter for immunization: Secondary | ICD-10-CM

## 2024-04-20 DIAGNOSIS — I10 Essential (primary) hypertension: Secondary | ICD-10-CM

## 2024-04-20 DIAGNOSIS — M1712 Unilateral primary osteoarthritis, left knee: Secondary | ICD-10-CM

## 2024-04-20 DIAGNOSIS — F411 Generalized anxiety disorder: Secondary | ICD-10-CM

## 2024-04-20 MED ORDER — ALENDRONATE SODIUM 70 MG PO TABS: 70.0000 mg | ORAL_TABLET | ORAL | 11 refills | Status: AC

## 2024-04-20 MED ORDER — BUSPIRONE HCL 15 MG PO TABS: 15.0000 mg | ORAL_TABLET | Freq: Three times a day (TID) | ORAL | 6 refills | Status: AC

## 2024-04-20 MED ORDER — POTASSIUM CHLORIDE CRYS ER 10 MEQ PO TBCR: 10.0000 meq | EXTENDED_RELEASE_TABLET | Freq: Two times a day (BID) | ORAL | 6 refills | Status: AC

## 2024-04-20 NOTE — Progress Notes (Signed)
° °  Subjective:    Patient ID: Natalie Campos, female    DOB: December 17, 1960, 63 y.o.   MRN: [id number]  HPI 6 month follow up no concerns voiced Immunization due - Plan: Flu vaccine HIGH DOSE PF(Fluzone Trivalent)  MS (multiple sclerosis)  Localized osteoporosis without current pathological fracture  Primary osteoarthritis of left knee  Other hyperlipidemia - Plan: Lipid Panel  Essential hypertension - Plan: Basic metabolic panel with GFR, Microalbumin/Creatinine Ratio, Urine  High risk medication use - Plan: Hepatic function panel Patient relates her MS is giving her trouble her specialist is following her they stated morning likely they are doing another MRI in a year She has a lot of weakness and fatigue associated with it She has significant advanced osteoarthritis of the left knee that is progressive she has had previous injections and gels which unfortunately have not helped her issue She was told by a specialist she should not have surgery because of her MS She has a history of hyperlipidemia History of hypertension takes her meds She has significant anxiety related issues that she states are getting worse.  She has tried counseling it really did not seem to help She denies being severely depressed just anxious She is on pain medication through pain management center We did discuss osteoporosis and her bone density she is not on any medication we discussed options Review of Systems     Objective:   Physical Exam  General-in no acute distress Eyes-no discharge Lungs-respiratory rate normal, CTA CV-no murmurs,RRR Extremities skin warm dry no edema Neuro grossly normal Behavior normal, alert       Assessment & Plan:   1. Immunization due (Primary) Flu shot today - Flu vaccine HIGH DOSE PF(Fluzone Trivalent)  2. MS (multiple sclerosis) Followed by specialist  3. Localized osteoporosis without current pathological fracture Recommend Fosamax once per week side  effects discussed proper way to take it discussed if any problems notify us   4. Primary osteoarthritis of left knee I think she ought to consider a second opinion with [facility] for her left knee currently right now she would like to think about Cardiovascularly I think she could handle the surgery   5. Other hyperlipidemia Check lipid profile. - Lipid Panel  6. Essential hypertension Continue medication blood pressure good control take potassium within the pulmonary - Basic metabolic panel with GFR - Microalbumin/Creatinine Ratio, Urine  7. High risk medication use Lab work ordered - Hepatic function panel  8. GAD (generalized anxiety disorder) Bump up the dose of BuSpar  to 15 mg 3 times daily let us  know if that is not helping enough follow-up sooner if any problems  Follow-up if doing well in 6 months

## 2024-04-23 ENCOUNTER — Other Ambulatory Visit: Payer: Self-pay | Admitting: Physician Assistant

## 2024-04-23 DIAGNOSIS — R0602 Shortness of breath: Secondary | ICD-10-CM

## 2024-04-27 ENCOUNTER — Other Ambulatory Visit: Payer: Self-pay

## 2024-04-27 ENCOUNTER — Telehealth: Payer: Self-pay

## 2024-04-27 DIAGNOSIS — R32 Unspecified urinary incontinence: Secondary | ICD-10-CM

## 2024-04-27 MED ORDER — TAMSULOSIN HCL 0.4 MG PO CAPS: 0.4000 mg | ORAL_CAPSULE | Freq: Every day | ORAL | 0 refills | Status: DC

## 2024-04-27 NOTE — Telephone Encounter (Signed)
 Tamsulosin  rx sent in 04/27/24

## 2024-05-20 ENCOUNTER — Other Ambulatory Visit: Payer: Self-pay | Admitting: Family Medicine

## 2024-05-20 DIAGNOSIS — R0602 Shortness of breath: Secondary | ICD-10-CM

## 2024-05-26 ENCOUNTER — Other Ambulatory Visit: Payer: Self-pay | Admitting: Family Medicine

## 2024-05-26 DIAGNOSIS — E876 Hypokalemia: Secondary | ICD-10-CM

## 2024-05-29 ENCOUNTER — Encounter: Payer: Self-pay | Admitting: Family Medicine

## 2024-06-01 ENCOUNTER — Other Ambulatory Visit: Payer: Self-pay

## 2024-06-01 DIAGNOSIS — R32 Unspecified urinary incontinence: Secondary | ICD-10-CM

## 2024-06-01 MED ORDER — TAMSULOSIN HCL 0.4 MG PO CAPS: 0.4000 mg | ORAL_CAPSULE | Freq: Every day | ORAL | 0 refills | Status: AC

## 2024-06-02 ENCOUNTER — Other Ambulatory Visit: Payer: Self-pay | Admitting: Family Medicine

## 2024-06-10 ENCOUNTER — Other Ambulatory Visit: Payer: Self-pay | Admitting: Family Medicine

## 2024-09-14 ENCOUNTER — Ambulatory Visit: Admitting: Neurology

## 2024-10-19 ENCOUNTER — Ambulatory Visit: Admitting: Family Medicine

## 2024-10-23 ENCOUNTER — Ambulatory Visit: Admitting: Family Medicine

## 2024-12-08 ENCOUNTER — Ambulatory Visit
